# Patient Record
Sex: Female | Born: 1970
Health system: Southern US, Community
[De-identification: ages and names within clinical notes are randomized; demographics above are authoritative.]

## PROBLEM LIST (undated history)

## (undated) DIAGNOSIS — K219 Gastro-esophageal reflux disease without esophagitis: Secondary | ICD-10-CM

## (undated) DIAGNOSIS — O021 Missed abortion: Secondary | ICD-10-CM

## (undated) DIAGNOSIS — R002 Palpitations: Secondary | ICD-10-CM

## (undated) DIAGNOSIS — L853 Xerosis cutis: Secondary | ICD-10-CM

## (undated) DIAGNOSIS — J302 Other seasonal allergic rhinitis: Secondary | ICD-10-CM

## (undated) DIAGNOSIS — E282 Polycystic ovarian syndrome: Secondary | ICD-10-CM

## (undated) DIAGNOSIS — N63 Unspecified lump in unspecified breast: Secondary | ICD-10-CM

## (undated) DIAGNOSIS — E739 Lactose intolerance, unspecified: Secondary | ICD-10-CM

## (undated) DIAGNOSIS — R5383 Other fatigue: Secondary | ICD-10-CM

## (undated) DIAGNOSIS — N979 Female infertility, unspecified: Secondary | ICD-10-CM

## (undated) DIAGNOSIS — R21 Rash and other nonspecific skin eruption: Secondary | ICD-10-CM

## (undated) DIAGNOSIS — M791 Myalgia, unspecified site: Secondary | ICD-10-CM

## (undated) DIAGNOSIS — M2550 Pain in unspecified joint: Secondary | ICD-10-CM

## (undated) DIAGNOSIS — T7840XA Allergy, unspecified, initial encounter: Secondary | ICD-10-CM

## (undated) DIAGNOSIS — E785 Hyperlipidemia, unspecified: Secondary | ICD-10-CM

## (undated) DIAGNOSIS — I10 Essential (primary) hypertension: Secondary | ICD-10-CM

## (undated) DIAGNOSIS — E559 Vitamin D deficiency, unspecified: Secondary | ICD-10-CM

## (undated) DIAGNOSIS — F439 Reaction to severe stress, unspecified: Secondary | ICD-10-CM

## (undated) DIAGNOSIS — K5900 Constipation, unspecified: Secondary | ICD-10-CM

## (undated) DIAGNOSIS — E669 Obesity, unspecified: Secondary | ICD-10-CM

## (undated) HISTORY — DX: Constipation, unspecified: K59.00

## (undated) HISTORY — DX: Xerosis cutis: L85.3

## (undated) HISTORY — DX: Unspecified lump in unspecified breast: N63.0

## (undated) HISTORY — DX: Other seasonal allergic rhinitis: J30.2

## (undated) HISTORY — DX: Reaction to severe stress, unspecified: F43.9

## (undated) HISTORY — DX: Rash and other nonspecific skin eruption: R21

## (undated) HISTORY — DX: Vitamin D deficiency, unspecified: E55.9

## (undated) HISTORY — DX: Female infertility, unspecified: N97.9

## (undated) HISTORY — PX: OTHER SURGICAL HISTORY: SHX169

## (undated) HISTORY — PX: LASIK: SHX215

## (undated) HISTORY — DX: Essential (primary) hypertension: I10

## (undated) HISTORY — DX: Hyperlipidemia, unspecified: E78.5

## (undated) HISTORY — DX: Obesity, unspecified: E66.9

## (undated) HISTORY — PX: EYE SURGERY: SHX253

## (undated) HISTORY — PX: BREAST BIOPSY: SHX20

## (undated) HISTORY — DX: Palpitations: R00.2

## (undated) HISTORY — DX: Other fatigue: R53.83

## (undated) HISTORY — DX: Pain in unspecified joint: M25.50

## (undated) HISTORY — DX: Lactose intolerance, unspecified: E73.9

## (undated) HISTORY — DX: Myalgia, unspecified site: M79.10

## (undated) HISTORY — DX: Allergy, unspecified, initial encounter: T78.40XA

---

## 1992-09-17 HISTORY — PX: WISDOM TOOTH EXTRACTION: SHX21

## 1999-03-02 ENCOUNTER — Other Ambulatory Visit: Admission: RE | Admit: 1999-03-02 | Discharge: 1999-03-02 | Payer: Self-pay | Admitting: Obstetrics and Gynecology

## 2000-03-13 ENCOUNTER — Other Ambulatory Visit: Admission: RE | Admit: 2000-03-13 | Discharge: 2000-03-13 | Payer: Self-pay | Admitting: Obstetrics and Gynecology

## 2001-03-07 ENCOUNTER — Other Ambulatory Visit: Admission: RE | Admit: 2001-03-07 | Discharge: 2001-03-07 | Payer: Self-pay | Admitting: Obstetrics and Gynecology

## 2002-03-06 ENCOUNTER — Other Ambulatory Visit: Admission: RE | Admit: 2002-03-06 | Discharge: 2002-03-06 | Payer: Self-pay | Admitting: Obstetrics and Gynecology

## 2002-07-06 ENCOUNTER — Encounter: Payer: Self-pay | Admitting: Obstetrics

## 2002-07-06 ENCOUNTER — Ambulatory Visit (HOSPITAL_COMMUNITY): Admission: RE | Admit: 2002-07-06 | Discharge: 2002-07-06 | Payer: Self-pay | Admitting: Obstetrics

## 2003-03-12 ENCOUNTER — Other Ambulatory Visit: Admission: RE | Admit: 2003-03-12 | Discharge: 2003-03-12 | Payer: Self-pay | Admitting: Obstetrics and Gynecology

## 2003-06-25 ENCOUNTER — Encounter: Admission: RE | Admit: 2003-06-25 | Discharge: 2003-06-25 | Payer: Self-pay | Admitting: Occupational Medicine

## 2003-06-25 ENCOUNTER — Encounter: Payer: Self-pay | Admitting: Occupational Medicine

## 2004-03-10 ENCOUNTER — Other Ambulatory Visit: Admission: RE | Admit: 2004-03-10 | Discharge: 2004-03-10 | Payer: Self-pay | Admitting: Obstetrics and Gynecology

## 2005-03-05 ENCOUNTER — Ambulatory Visit (HOSPITAL_COMMUNITY): Admission: RE | Admit: 2005-03-05 | Discharge: 2005-03-05 | Payer: Self-pay | Admitting: Allergy

## 2005-04-17 ENCOUNTER — Ambulatory Visit (HOSPITAL_BASED_OUTPATIENT_CLINIC_OR_DEPARTMENT_OTHER): Admission: RE | Admit: 2005-04-17 | Discharge: 2005-04-17 | Payer: Self-pay | Admitting: Obstetrics and Gynecology

## 2005-04-22 ENCOUNTER — Ambulatory Visit: Payer: Self-pay | Admitting: Internal Medicine

## 2006-10-11 ENCOUNTER — Inpatient Hospital Stay (HOSPITAL_COMMUNITY): Admission: AD | Admit: 2006-10-11 | Discharge: 2006-10-11 | Payer: Self-pay | Admitting: *Deleted

## 2007-02-17 ENCOUNTER — Inpatient Hospital Stay (HOSPITAL_COMMUNITY): Admission: AD | Admit: 2007-02-17 | Discharge: 2007-02-21 | Payer: Self-pay | Admitting: Obstetrics and Gynecology

## 2007-02-18 ENCOUNTER — Encounter (INDEPENDENT_AMBULATORY_CARE_PROVIDER_SITE_OTHER): Payer: Self-pay | Admitting: Obstetrics and Gynecology

## 2007-02-24 ENCOUNTER — Encounter: Admission: RE | Admit: 2007-02-24 | Discharge: 2007-03-25 | Payer: Self-pay | Admitting: Obstetrics and Gynecology

## 2007-03-26 ENCOUNTER — Encounter: Admission: RE | Admit: 2007-03-26 | Discharge: 2007-04-25 | Payer: Self-pay | Admitting: Obstetrics and Gynecology

## 2007-04-26 ENCOUNTER — Encounter: Admission: RE | Admit: 2007-04-26 | Discharge: 2007-05-26 | Payer: Self-pay | Admitting: Obstetrics and Gynecology

## 2007-05-27 ENCOUNTER — Encounter: Admission: RE | Admit: 2007-05-27 | Discharge: 2007-06-25 | Payer: Self-pay | Admitting: Obstetrics and Gynecology

## 2007-06-26 ENCOUNTER — Encounter: Admission: RE | Admit: 2007-06-26 | Discharge: 2007-07-26 | Payer: Self-pay | Admitting: Obstetrics and Gynecology

## 2007-07-27 ENCOUNTER — Encounter: Admission: RE | Admit: 2007-07-27 | Discharge: 2007-08-21 | Payer: Self-pay | Admitting: Obstetrics and Gynecology

## 2008-06-13 IMAGING — US US OB COMP +14 WK
1 series · 13 of 13 positions shown · non-contrast
Comparison: none

OBSTETRICAL ULTRASOUND:

 This ultrasound exam was performed in the [HOSPITAL] Ultrasound Department.  The OB US report was generated in the AS system, and faxed to the ordering physician.  This report is also available in [REDACTED] PACS.

[Series 1: us ob comp +14 wk · 0.30mm/px · 13 of 13 slices shown]
[im 1/13]
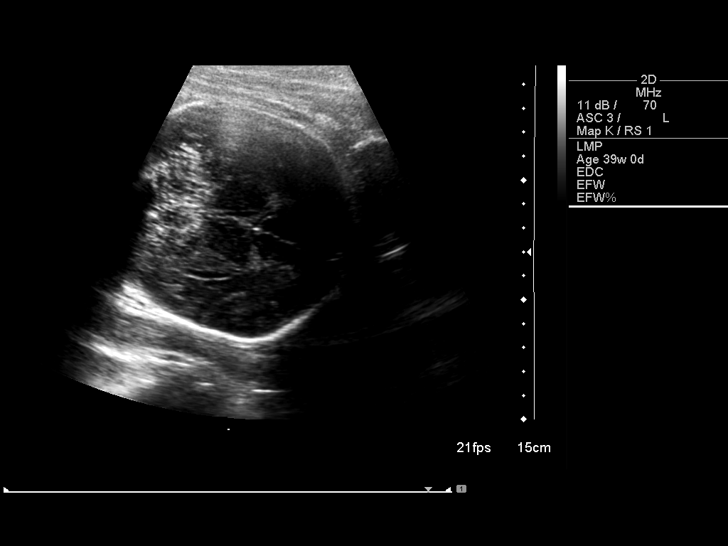
[im 2/13]
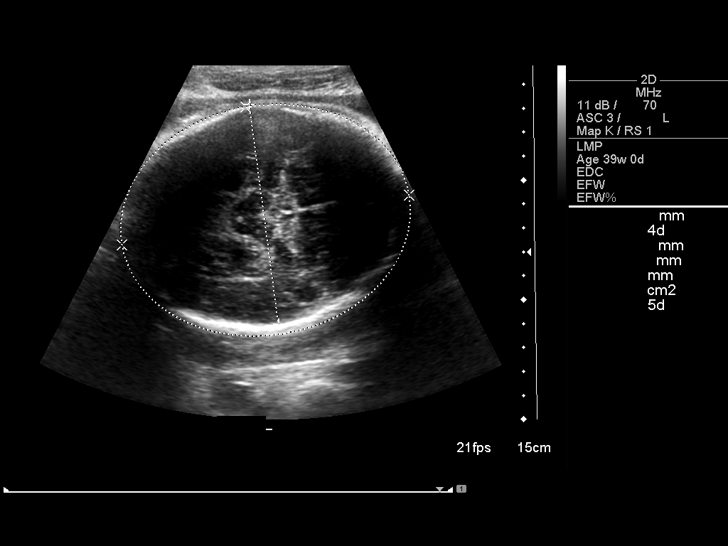
[im 3/13]
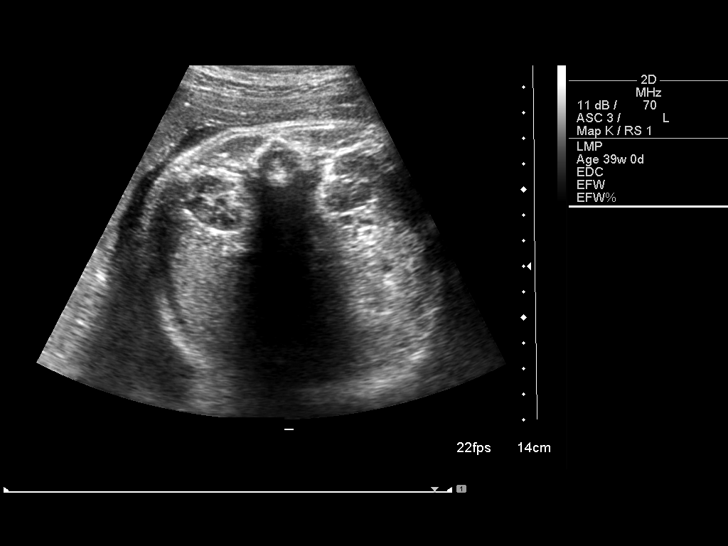
[im 4/13]
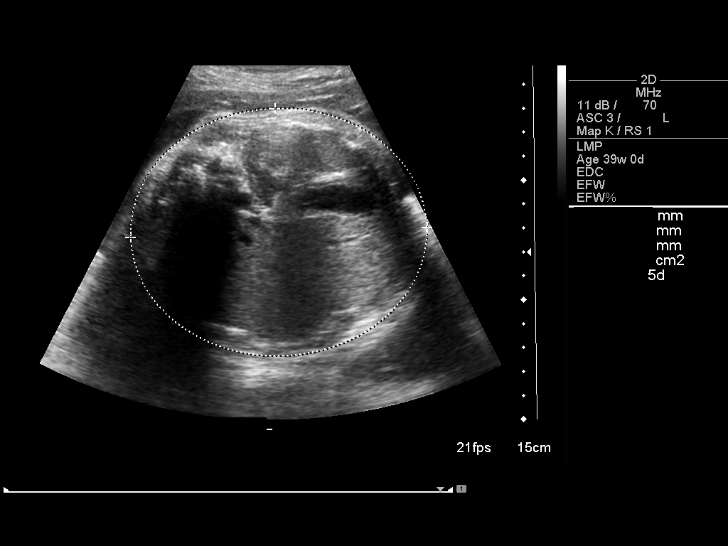
[im 5/13]
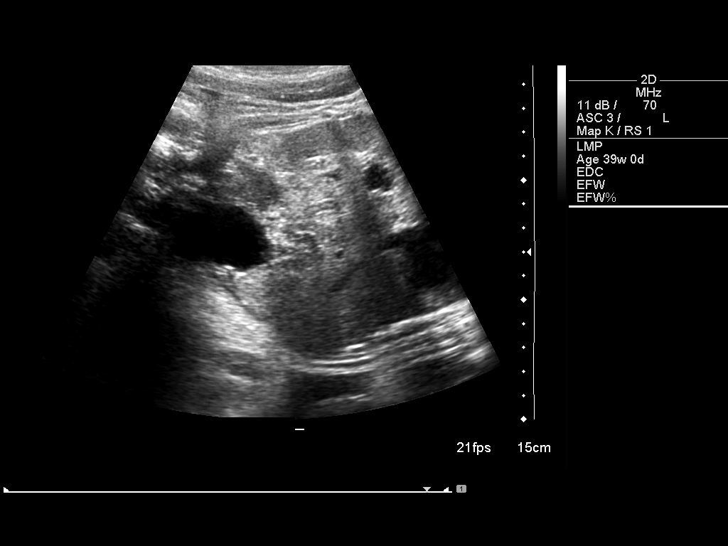
[im 6/13]
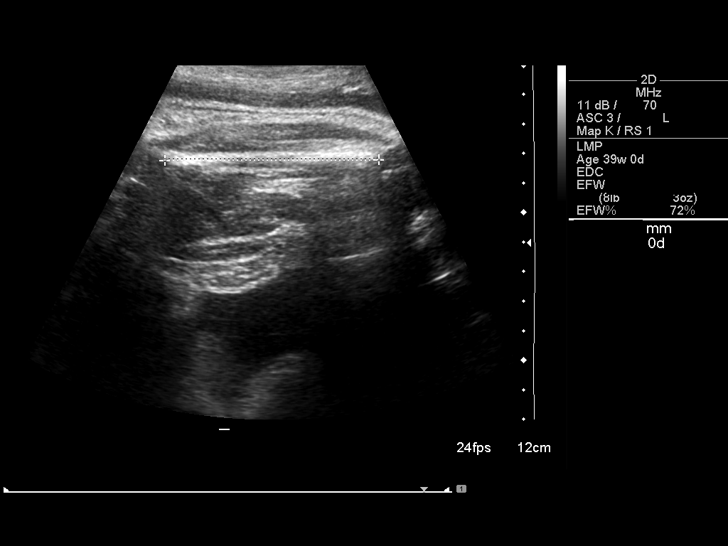
[im 7/13]
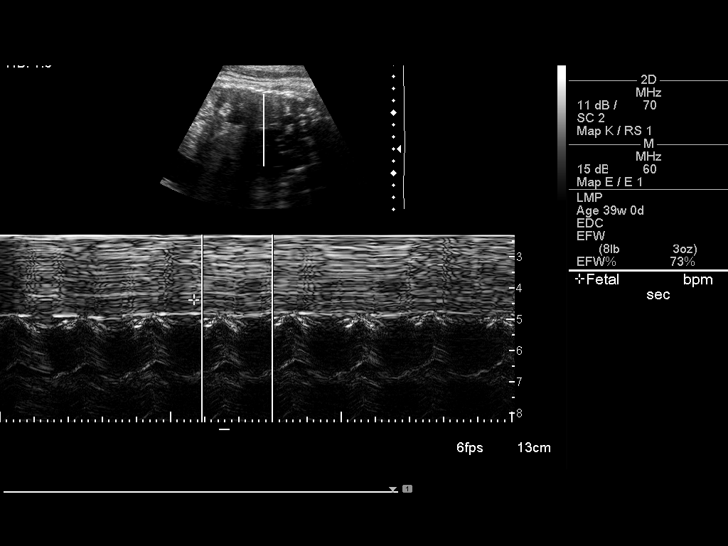
[im 8/13]
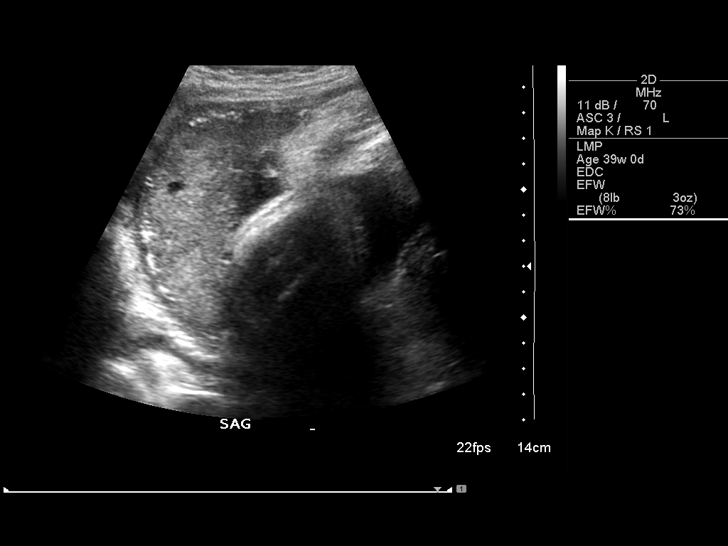
[im 9/13]
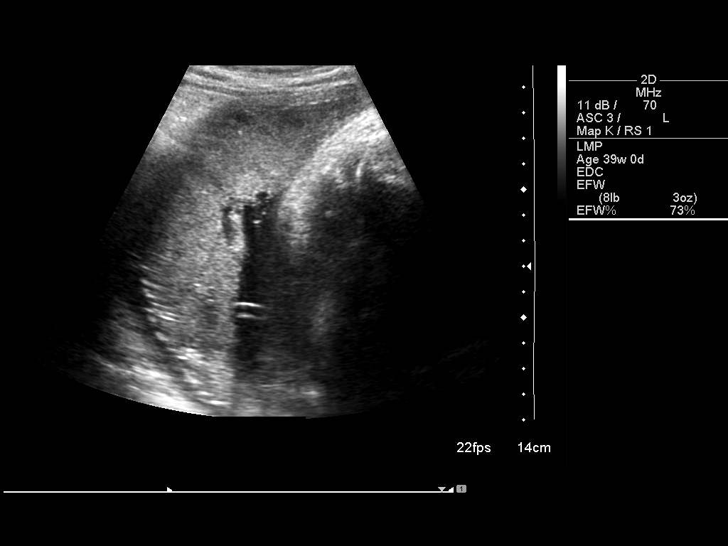
[im 10/13]
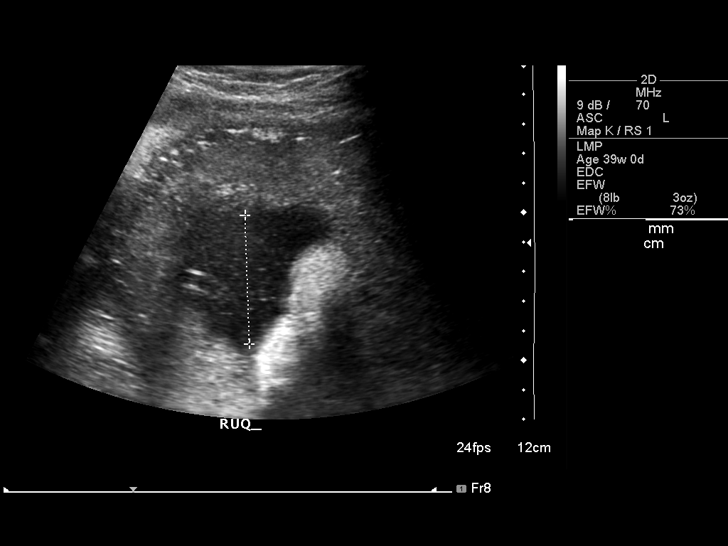
[im 11/13]
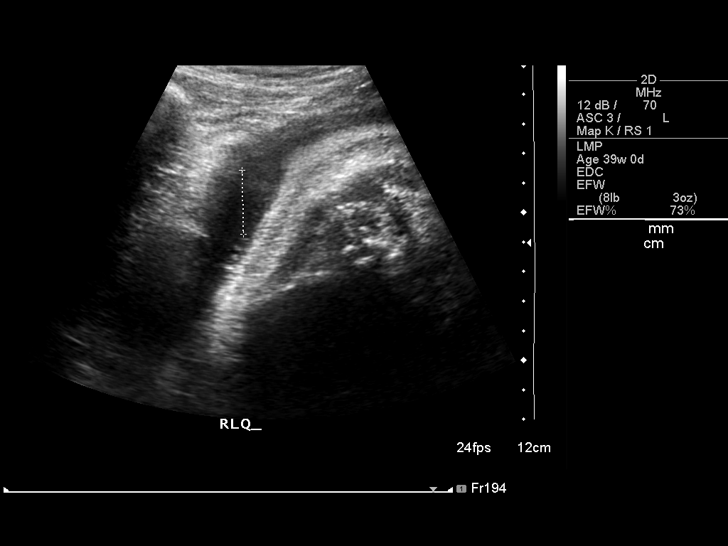
[im 12/13]
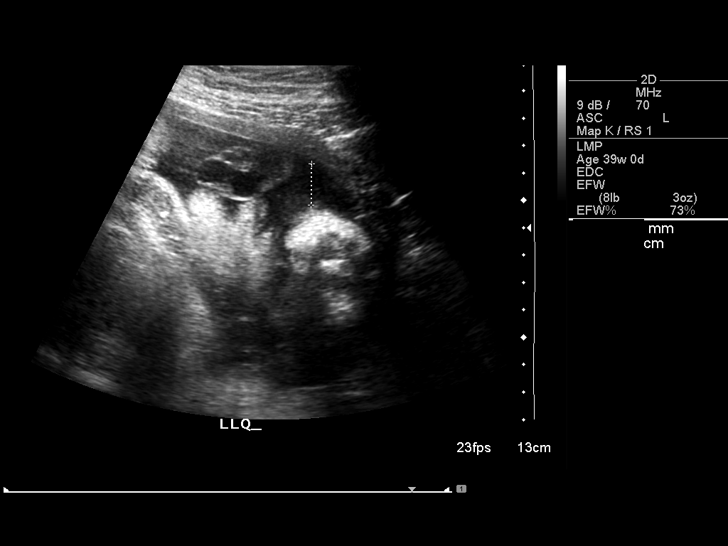
[im 13/13]
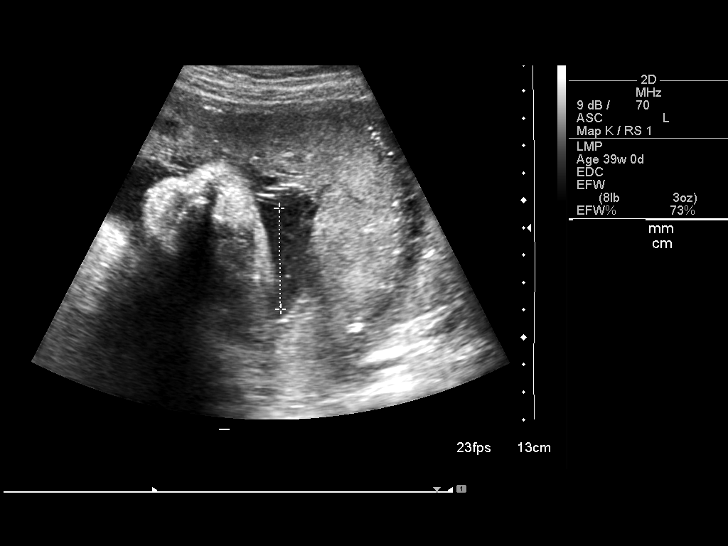

[13 of 13 positions shown; findings below may reference images not displayed]

IMPRESSION: See AS Obstetric US report.

## 2009-05-26 ENCOUNTER — Emergency Department (HOSPITAL_COMMUNITY): Admission: EM | Admit: 2009-05-26 | Discharge: 2009-05-26 | Payer: Self-pay | Admitting: Family Medicine

## 2009-08-29 ENCOUNTER — Emergency Department (HOSPITAL_COMMUNITY): Admission: EM | Admit: 2009-08-29 | Discharge: 2009-08-29 | Payer: Self-pay | Admitting: Family Medicine

## 2010-09-29 ENCOUNTER — Emergency Department (HOSPITAL_COMMUNITY)
Admission: EM | Admit: 2010-09-29 | Discharge: 2010-09-29 | Payer: Self-pay | Source: Home / Self Care | Admitting: Family Medicine

## 2010-12-22 LAB — BASIC METABOLIC PANEL
BUN: 16 mg/dL (ref 6–23)
CO2: 29 mEq/L (ref 19–32)
Calcium: 9.4 mg/dL (ref 8.4–10.5)
Chloride: 101 mEq/L (ref 96–112)
Creatinine, Ser: 0.93 mg/dL (ref 0.4–1.2)
GFR calc Af Amer: >60 mL/min (ref >60)
GFR calc non Af Amer: >60 mL/min (ref >60)
Glucose, Bld: 83 mg/dL (ref 70–99)
Potassium: 4 mEq/L (ref 3.5–5.1)
Sodium: 139 mEq/L (ref 135–145)

## 2010-12-22 LAB — TSH: TSH: 0.406 u[IU]/mL (ref 0.350–4.500)

## 2011-01-30 NOTE — Discharge Summary (Signed)
NAMENETASHA, Robin Knapp              ACCOUNT NO.:  [id number]   MEDICAL RECORD NO.:  [id number]          PATIENT TYPE:  INP   LOCATION:  9117                          FACILITY:  [facility]   PHYSICIAN:  Maxie Better, M.D.DATE OF BIRTH:  07-Mar-1971   DATE OF ADMISSION:  02/17/2007  DATE OF DISCHARGE:  02/21/2007                               DISCHARGE SUMMARY   ADMISSION DIAGNOSES:  1. Spontaneous rupture of membranes.  2. Intrauterine gestation at 39 weeks.   DISCHARGE DIAGNOSES:  1. Term gestation, delivered.  2. Arrest of dilatation.  3. Status post a primary cesarean section.  4. Postoperative anemia.   PROCEDURES:  Primary cesarean section, Kerr hysterotomy.   HISTORY OF PRESENT ILLNESS:  This is a 40 year old gravida 1, para 0  female at 39 years' gestation who presented with spontaneous rupture of  membranes with light meconium on February 17, 2007.  Prenatal course had been  unremarkable.   HOSPITAL COURSE:  The patient was admitted to [facility].  She was  group B strep negative.  Pitocin augmentation was started, and infusion  was also given due to the light meconium.  The patient had an epidural  for pain management.  She progressed to anterior lip and +1 to +2  station with a persistent posterior presentation.  She arrested at that  dilatation for 4 hours, and decision was therefore made to perform a  primary cesarean section.  The patient underwent a primary C-section  with resultant delivery of an 8-pound 15-ounce live female from the  occiput posterior presentation, Apgars of 9 and 9.  The patient had  polycystic ovaries noted at the time of her C-section, normal tubes  noted.  Her postoperative course was unremarkable.  She was tolerating a  regular diet.  Her CBC on postoperative day #1 showed a hemoglobin 10.1,  hematocrit of 29.6, platelet count of 247,000, white count of 17.0.  By  postoperative day #3, the patient remained well.  She was afebrile.  Her  incision was without erythema, induration or exudate.  The staples were  removed.  Steri-Strips were placed.  She had 2+ edema.  She was deemed  well for discharge home.   DISPOSITION:  Home.   CONDITION:  Stable.   DISCHARGE MEDICATIONS:  1. Tylox #30 one to two tablets every 3 to 4 p.r.n. pain.  2. Prenatal vitamins 1 p.o. daily  3. Motrin 800 mg 1 p.o. every 6 to 8 hours p.r.n. pain.   DISCHARGE INSTRUCTIONS:  Per the postpartum booklet given.  Follow-up at  [facility] in 6 weeks.      Maxie Better, M.D.  Electronically Signed     [place]/MEDQ  D:  02/21/2007  T:  02/21/2007  Job:  [id number]

## 2011-01-30 NOTE — Op Note (Signed)
Robin Knapp, Robin Knapp              ACCOUNT NO.:  [id number]   MEDICAL RECORD NO.:  [id number]          PATIENT TYPE:  INP   LOCATION:  9117                          FACILITY:  [facility]   PHYSICIAN:  Maxie Better, M.D.DATE OF BIRTH:  10-Apr-1971   DATE OF PROCEDURE:  02/18/2007  DATE OF DISCHARGE:                               OPERATIVE REPORT   PREOPERATIVE DIAGNOSIS:  Arrest of dilatation.   PROCEDURE:  Primary cesarean section, Kerr hysterotomy.   POSTOPERATIVE DIAGNOSIS:  Arrest of dilatation, direct occiput  presentation.   ANESTHESIA:  Epidural.   SURGEON:  Maxie Better, M.D.   ASSISTANT:  Marlinda Mike, C.N.M.   INDICATION:  A 40 year old, G1, P0, female at 33 weeks presented with  spontaneous rupture of membranes, light meconium fluid on February 17, 2007.  She had Pitocin augmentation of labor, epidural for pain management.  She progressed to 9.5 cm, +1 station, and arrested dilatation for 4  hours.  The patient had an estimated fetal weight of 8 pounds 3 ounces  on the day of admission.  Risk and benefit of surgical procedure was  explained to the patient.  Consent was signed.  The patient was  transferred to the operating room.   PROCEDURE:  Under adequate epidural anesthesia, the patient was placed  in the supine position with a left lateral tilt.  She was sterilely  prepped and draped in usual fashion.  An indwelling Foley catheter was  already in place.  A Pfannenstiel skin was made, carried down to the  rectus fascia.  Rectus fascia was opened transversely.  The rectus  fascia was sharply dissected off the rectus muscle in a superior and  inferior fashion.  The rectus muscle was split in midline.  The parietal  peritoneum was entered bluntly and extended.  The vesicouterine  peritoneum was opened transversely.  The bladder displaced inferiorly  after blunt dissection.  Curvilinear low transverse uterine incision was  then made, extended with bandage scissors.   Subsequent delivery of a  live female from the direct occiput posterior presentation was  accomplished.  Baby was bulb suctioned on the abdomen.  The cord was  clamped and cut.  The baby was transferred to the awaiting pediatrician  who ultimately assigned Apgars of 9 and 9 at one and five minutes.  The  placenta which was posterior was ultimately manually removed.  Uterine  cavity was cleaned of debris.  Uterine incision had no extension.  Uterine incision was closed in 2 layers, the first layer of 0 Monocryl  running locked stitch; second layer was imbricating using 0 Monocryl  suture.  Good hemostasis was noted.  Normal tubes were noted  bilaterally.  Bilateral polycystic ovaries were noted.  A 2.5-cm  subserosal anterior fibroid was noted.  No fibroids were seen.  The  abdomen was copiously irrigated, suctioned of debris.  The parietal  peritoneum was closed with 2-0 Vicryl.  The rectus fascia was closed  with 0 Vicryl x2.  The subcutaneous area was irrigated, small bleeders  were cauterized and interrupted 2-0 plain sutures were placed.  Skin was  approximated using  Ethicon staples.   Specimens:  Placenta sent to pathology.  Estimated blood loss was 700  mL.  Intraoperative fluid was 1000 mL.  Urine output was 175 mL clear  yellow urine.  Sponge and instrument counts x2 were correct.  Complication was none.  Weight of the baby was 8 pounds 15 ounces.  The  patient tolerated the procedure well and was transferred to recovery in  stable condition.      Maxie Better, M.D.  Electronically Signed     [place]/MEDQ  D:  02/18/2007  T:  02/18/2007  Job:  [id number]

## 2011-02-02 NOTE — Consult Note (Signed)
Robin Knapp, Robin Knapp              ACCOUNT NO.:  [id number]   MEDICAL RECORD NO.:  [id number]          PATIENT TYPE:  MAT   LOCATION:  MATC                          FACILITY:  [facility]   PHYSICIAN:  Gerri Spore B. Earlene Plater, M.D.  DATE OF BIRTH:  [birth year]   DATE OF CONSULTATION:  DATE OF DISCHARGE:                                 CONSULTATION   CHIEF COMPLAINT:  Fever.   HISTORY OF PRESENT ILLNESS:  40 year old African American female gravida  1, para 0, at 20 plus weeks, presents with one day of fever and  associated chills and myalgias with 2-3 days preceding of generalized  nasal congestion, cough, and sore throat.  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  No known sick contacts.  Otherwise, no other  complaints.  Specifically, no ear pain and her cough has been non-  productive.   PAST MEDICAL HISTORY:  Otherwise, negative.   PAST OBSTETRICAL HISTORY:  Gravida 1.   MEDICATIONS:  Zyrtec.   ALLERGIES:  None.   PHYSICAL EXAMINATION:  VITAL SIGNS:  Temperature 98.8, pulse 113,  respirations 24, blood pressure 119/77, fetal heart rate initially 175  and rechecked at 162, O2 sat is 99% on room air.  GENERAL:  The patient is alert and oriented in no acute distress, is  mildly ill appearing with nasal congestion.  LUNGS:  Clear.  HEART:  Regular rate and rhythm.  ABDOMEN:  Gravid consistent with dates and nontender.   LABORATORY ASSESSMENT:  Urinalysis is normal.  Complete blood count and  rapid flu is ordered and pending at this time.   ASSESSMENT:  Probable viral illness, most likely influenza. The patient  is non-toxic showing no signs of pulmonary compromise.  Her fever has  come down after she gave herself one dose of Tylenol.  At this point, I  recommend rest, hydrate, and continue Tylenol around the clock.  Notify  us if symptoms worsen.  Follow up [facility] in 1-2 weeks.      Gerri Spore B. Earlene Plater, M.D.  Electronically Signed     [place]/MEDQ  D:  10/11/2006  T:   10/11/2006  Job:  [id number]

## 2011-02-02 NOTE — Procedures (Signed)
NAMELEONDA, Robin Knapp            ACCOUNT NO.:  [id number]   MEDICAL RECORD NO.:  [id number]          PATIENT TYPE:  OUT   LOCATION:  SLEEP CENTER                 FACILITY:  [facility]   PHYSICIAN:  Clinton D. Maple Hudson, M.D. DATE OF BIRTH:  [birth year]   DATE OF STUDY:  04/17/2005                              NOCTURNAL POLYSOMNOGRAM   REFERRING PHYSICIAN:  Dr. Maxie Better   DATE OF STUDY:  April 17, 2005   INDICATION FOR STUDY:  Hypersomnia with sleep apnea.   EPWORTH SLEEPINESS SCORE:  17/24   BMI:  26   WEIGHT:  155 pounds   SLEEP ARCHITECTURE:  Total sleep time 413 minutes with sleep efficiency 84%.  Stage I was 4%, stage II 54%, states III and IV 17%.  REM 24% of total sleep  time.  Sleep latency 13 minutes.  REM latency 227 minutes.  Awake after  sleep onset 64 minutes.  Arousal index 6.  There were frequent side-to-side  position changes.  No bedtime medication reported.  She does use a long  acting bronchodilator, which may have some impact on sleep quality, as well  as a mildly sedating antihistamine.   RESPIRATORY DATA:  Respiratory disturbance index (RDI, AHI) 0.6 obstructive  events per hour, which is within normal limits.  This reflected three  obstructive apneas and one central apnea.  The events were recorded and  while supine and on left side.  She did not meet criteria for CPAP titration  while on the study.   OXYGEN DATA:  Mild occasional snoring with oxygen desaturations to a nadir  of 96%.  Mean oxygen saturation through the study was 98% on room air.   CARDIAC DATA:  Normal sinus rhythm.   MOVEMENT/PARASOMNIA:  A total of 33 limb jerks were recorded, of which 8  were associated with arousal or awakening for a periodic limb movement with  arousal index of 1.3 per hour, which is probably insignificant.   IMPRESSION/RECOMMENDATION:  1.  Occasional sleep disorder breathing events, well within normal adult      limits, with an RDI of 0.6 per hour, mild  snoring and normal      oxygenation.  2.  Unremarkable sleep architecture for Sleep Center environment with some      awakening after sleep onset.  3.  The patient indicated that she did not cough as much on the study night      as she sometimes does at home.  Consider how this may refect on      management of asthma or possibly reflux.  4.  Technician commented that the patient's sleep may be disturbed by stress      and excitement related to upcoming wedding.      Clinton D. Maple Hudson, M.D.  Diplomat    CDY/MEDQ  D:  04/22/2005 10:05:30  T:  04/23/2005 09:32:43  Job:  [id number]

## 2011-07-05 LAB — CBC
HCT: 29.6 — ABNORMAL LOW
HCT: 36.8
Hemoglobin: 10.1 — ABNORMAL LOW
Hemoglobin: 12.6
MCHC: 34
MCV: 87.5
RBC: 3.39 — ABNORMAL LOW
RBC: 4.27
WBC: 11.1 — ABNORMAL HIGH

## 2012-01-04 ENCOUNTER — Other Ambulatory Visit: Payer: Self-pay | Admitting: Advanced Practice Midwife

## 2012-01-04 MED ORDER — AZITHROMYCIN 250 MG PO TABS: ORAL_TABLET | ORAL | Status: AC

## 2012-01-06 ENCOUNTER — Encounter (HOSPITAL_COMMUNITY): Payer: Self-pay | Admitting: *Deleted

## 2012-01-06 ENCOUNTER — Emergency Department (INDEPENDENT_AMBULATORY_CARE_PROVIDER_SITE_OTHER): Payer: 59

## 2012-01-06 ENCOUNTER — Emergency Department (HOSPITAL_COMMUNITY)
Admission: EM | Admit: 2012-01-06 | Discharge: 2012-01-06 | Disposition: A | Discharge status: Home / Self Care | Payer: 59 | Source: Home / Self Care | Attending: Family Medicine | Admitting: Family Medicine

## 2012-01-06 DIAGNOSIS — J45901 Unspecified asthma with (acute) exacerbation: Secondary | ICD-10-CM

## 2012-01-06 DIAGNOSIS — J069 Acute upper respiratory infection, unspecified: Secondary | ICD-10-CM

## 2012-01-06 MED ORDER — LEVALBUTEROL TARTRATE 45 MCG/ACT IN AERO: 1.0000 | INHALATION_SPRAY | Freq: Four times a day (QID) | RESPIRATORY_TRACT | Status: DC | PRN

## 2012-01-06 MED ORDER — HYDROCODONE-ACETAMINOPHEN 7.5-500 MG/15ML PO SOLN: 15.0000 mL | Freq: Three times a day (TID) | ORAL | Status: AC | PRN

## 2012-01-06 MED ORDER — PREDNISONE 20 MG PO TABS: ORAL_TABLET | ORAL | Status: AC

## 2012-01-06 NOTE — Discharge Instructions (Signed)
Your lungs are clear on chest x-ray. Your strep test is negative. My impression is that your having asthma exacerbation related to upper respiratory infection or seasonal allergies. Finish current antibiotic as previously prescribed. Is very important top keep well hydrated. Take the prescribed medications as instructed. Take ibuprofen scheduled every 8 hours for the next 24-48 hours take with food and plenty of liquids as it can upset your stomach, can alternate with Tylenol every 6 hours as needed for fever or pain. Use nasal saline spray at least 3 times a day. (simply saline is over the counter) Go to the emergency department if persistent cough, difficulty breathing new onset of fever or worsening symptoms despite following treatment. Dr. Alver Fisher number was provided aspergillin request to establish primary care.

## 2012-01-06 NOTE — ED Notes (Signed)
Pt with c/o of cough/congestion and sob onset 4/14 - pt has been using her inhaler and and daughters nebulizer without relief -

## 2012-01-06 NOTE — ED Provider Notes (Signed)
History     CSN: [id number]  Arrival date & time 01/06/12  1826   First MD Initiated Contact with Patient 01/06/12 1826      Chief Complaint  Patient presents with  . Cough  . Nasal Congestion  . Shortness of Breath    (Consider location/radiation/quality/duration/timing/severity/associated sxs/prior treatment) HPI Comments: 41 y/o non smoker female with h/o asthma here c/o 1 week with cough/congestion, sore throat, general malaise and shortness of breath. Has had yellow sputum and daughter recently diagnosed with pneumonia. Chest pain in anterior chest only with coughing. Currently taking azithromycin day 3/5 prescribed by another provider. Has used bronchodilators but she ran out. Afebrile has taken tylenol, dextromethorphan and pseudoephedrine for her symptoms.    Past Medical History  Diagnosis Date  . Asthma     Past Surgical History  Procedure Date  . Cesarean section   . Lasik   . Prk     History reviewed. No pertinent family history.  History  Substance Use Topics  . Smoking status: Never Smoker   . Smokeless tobacco: Not on file  . Alcohol Use: No    OB History    Grav Para Term Preterm Abortions TAB SAB Ect Mult Living                  Review of Systems  Constitutional: Positive for chills, appetite change and fatigue.  HENT: Positive for congestion, sore throat and sneezing. Negative for trouble swallowing and neck pain.   Eyes: Positive for redness.  Respiratory: Positive for cough, chest tightness and shortness of breath.   Cardiovascular: Negative for leg swelling.  Gastrointestinal: Negative for nausea, vomiting, abdominal pain and diarrhea.    Allergies  Sulfa antibiotics  Home Medications   Current Outpatient Rx  Name Route Sig Dispense Refill  . ACETAMINOPHEN 325 MG PO TABS Oral Take 1,000 mg by mouth every 6 (six) hours as needed.    . AZITHROMYCIN 250 MG PO TABS  2 tabs po x 1, then 1 tab po x 4 days 6 each 0  . DEXTROMETHORPHAN  POLISTIREX ER 30 MG/5ML PO LQCR Oral Take 60 mg by mouth as needed.    [name] DM-GUAIFENESIN ER 30-600 MG PO TB12 Oral Take 1 tablet by mouth every 12 (twelve) hours.    . IBUPROFEN 800 MG PO TABS Oral Take 800 mg by mouth every 8 (eight) hours as needed.    [name] PSEUDOEPHEDRINE HCL 30 MG PO TABS Oral Take 30 mg by mouth every 4 (four) hours as needed.    [name] HYDROCODONE-ACETAMINOPHEN 7.5-500 MG/15ML PO SOLN Oral Take 15 mLs by mouth every 8 (eight) hours as needed for pain or cough. 120 mL 0  . LEVALBUTEROL TARTRATE 45 MCG/ACT IN AERO Inhalation Inhale 1-2 puffs into the lungs every 6 (six) hours as needed for wheezing. 1 Inhaler 0  . PREDNISONE 20 MG PO TABS  2 tabs po daily for 5 days 10 tablet 0    BP 122/80  Pulse 103  Temp(Src) 98.4 F (36.9 C) (Oral)  Resp 20  SpO2 100%  LMP 12/15/2011  Physical Exam  Nursing note and vitals reviewed. Constitutional: She is oriented to person, place, and time. She appears well-developed and well-nourished. No distress.  HENT:  Head: Normocephalic and atraumatic.  Right Ear: External ear normal.  Left Ear: External ear normal.  Mouth/Throat: Oropharynx is clear and moist. No oropharyngeal exudate.       Nasal Congestion with erythema and swelling of nasal turbinates,  no rhinorrhea. Mild pharyngeal erythema no exudates. No uvula deviation. No trismus. TM's normal.  Eyes: Pupils are equal, round, and reactive to light. Right eye exhibits no discharge. Left eye exhibits no discharge.       Conjunctival erythema and eye puffiness bilateral.  Neck: Neck supple. No JVD present. No thyromegaly present.  Cardiovascular: Normal heart sounds.   Pulmonary/Chest: Effort normal.       Mildly decreased breath sounds bilaterally sporadic expiratory rhonchi. No orthopnea or tachypnea no active wheezing. No retractions, no rales.  Lymphadenopathy:    She has no cervical adenopathy.  Neurological: She is alert and oriented to person, place, and time.  Skin: No rash  noted.    ED Course  Procedures (including critical care time)   Labs Reviewed  POCT RAPID STREP A ([facility]RG CARE ONLY)  LAB REPORT - SCANNED   Dg Chest 2 View  01/06/2012  *RADIOLOGY REPORT*  Clinical Data: Cough, congestion and shortness of breath.  CHEST - 2 VIEW  Comparison: PA and lateral chest 07/06/2002.  Findings: Lungs are clear.  Heart size is normal.  No pneumothorax or effusion.  IMPRESSION: Negative chest.  Original Report Authenticated By: Bernadene Bell. D'ALESSIO, M.D.     1. Asthma exacerbation   2. URI (upper respiratory infection)       MDM  Normal chest Xray. Impress URI likely viral vs seasonal allergies causing asthma exacerbation. Asked to complete abxs as previously prescribed. Prescribed prednisone, xopenex refill, and hydrocodone. Reccommended supportive measures. Return if worsening symptoms despite following treatment.        Sharin Grave, MD 01/07/12 1427

## 2012-05-02 ENCOUNTER — Encounter (HOSPITAL_COMMUNITY): Payer: Self-pay | Admitting: *Deleted

## 2012-05-03 ENCOUNTER — Encounter (HOSPITAL_COMMUNITY): Payer: Self-pay | Admitting: Pharmacist

## 2012-05-05 ENCOUNTER — Other Ambulatory Visit: Payer: Self-pay | Admitting: Obstetrics and Gynecology

## 2012-05-09 ENCOUNTER — Encounter (HOSPITAL_COMMUNITY): Payer: Self-pay | Admitting: Anesthesiology

## 2012-05-09 ENCOUNTER — Ambulatory Visit (HOSPITAL_COMMUNITY): Payer: 59 | Admitting: Anesthesiology

## 2012-05-09 ENCOUNTER — Encounter (HOSPITAL_COMMUNITY)
Admission: RE | Disposition: A | Discharge status: Home / Self Care | Payer: Self-pay | Source: Ambulatory Visit | Attending: Obstetrics and Gynecology

## 2012-05-09 ENCOUNTER — Ambulatory Visit (HOSPITAL_COMMUNITY)
Admission: RE | Admit: 2012-05-09 | Discharge: 2012-05-09 | Disposition: A | Discharge status: Home / Self Care | Payer: 59 | Source: Ambulatory Visit | Attending: Obstetrics and Gynecology | Admitting: Obstetrics and Gynecology

## 2012-05-09 DIAGNOSIS — O034 Incomplete spontaneous abortion without complication: Secondary | ICD-10-CM

## 2012-05-09 DIAGNOSIS — O021 Missed abortion: Secondary | ICD-10-CM | POA: Insufficient documentation

## 2012-05-09 HISTORY — DX: Missed abortion: O02.1

## 2012-05-09 HISTORY — DX: Polycystic ovarian syndrome: E28.2

## 2012-05-09 HISTORY — PX: DILATION AND EVACUATION: SHX1459

## 2012-05-09 HISTORY — DX: Gastro-esophageal reflux disease without esophagitis: K21.9

## 2012-05-09 LAB — CBC
Platelets: 252 10*3/uL (ref 150–400)
RDW: 13.4 % (ref 11.5–15.5)
WBC: 9 10*3/uL (ref 4.0–10.5)

## 2012-05-09 SURGERY — DILATION AND EVACUATION, UTERUS
Anesthesia: Monitor Anesthesia Care | Wound class: Clean Contaminated

## 2012-05-09 MED ORDER — FENTANYL CITRATE 0.05 MG/ML IJ SOLN
INTRAMUSCULAR | Status: AC
  Filled 2012-05-09: qty 2

## 2012-05-09 MED ORDER — 0.9 % SODIUM CHLORIDE (POUR BTL) OPTIME
TOPICAL | Status: DC | PRN
  Administered 2012-05-09: 50 mL

## 2012-05-09 MED ORDER — CHLOROPROCAINE HCL 1 % IJ SOLN
INTRAMUSCULAR | Status: DC | PRN
  Administered 2012-05-09: 20 mL

## 2012-05-09 MED ORDER — CEFAZOLIN SODIUM-DEXTROSE 2-3 GM-% IV SOLR: 2.0000 g | INTRAVENOUS | Status: DC

## 2012-05-09 MED ORDER — PROPOFOL 10 MG/ML IV EMUL
INTRAVENOUS | Status: AC
  Filled 2012-05-09: qty 20

## 2012-05-09 MED ORDER — FENTANYL CITRATE 0.05 MG/ML IJ SOLN
25.0000 ug | INTRAMUSCULAR | Status: DC | PRN
  Administered 2012-05-09: 50 ug via INTRAVENOUS

## 2012-05-09 MED ORDER — LIDOCAINE HCL (CARDIAC) 20 MG/ML IV SOLN
INTRAVENOUS | Status: AC
  Filled 2012-05-09: qty 5

## 2012-05-09 MED ORDER — CHLOROPROCAINE HCL 1 % IJ SOLN
INTRAMUSCULAR | Status: AC
  Filled 2012-05-09: qty 30

## 2012-05-09 MED ORDER — FENTANYL CITRATE 0.05 MG/ML IJ SOLN
INTRAMUSCULAR | Status: DC | PRN
  Administered 2012-05-09: 50 ug via INTRAVENOUS
  Administered 2012-05-09: 25 ug via INTRAVENOUS

## 2012-05-09 MED ORDER — LACTATED RINGERS IV SOLN
INTRAVENOUS | Status: DC
  Administered 2012-05-09: 11:00:00 via INTRAVENOUS

## 2012-05-09 MED ORDER — MIDAZOLAM HCL 2 MG/2ML IJ SOLN
INTRAMUSCULAR | Status: AC
  Filled 2012-05-09: qty 2

## 2012-05-09 MED ORDER — HYDROCODONE-ACETAMINOPHEN 5-500 MG PO TABS: 1.0000 | ORAL_TABLET | Freq: Four times a day (QID) | ORAL | Status: AC | PRN

## 2012-05-09 MED ORDER — CEFAZOLIN SODIUM-DEXTROSE 2-3 GM-% IV SOLR
INTRAVENOUS | Status: AC
  Administered 2012-05-09: 2 g via INTRAVENOUS
  Filled 2012-05-09: qty 50

## 2012-05-09 MED ORDER — PROPOFOL 10 MG/ML IV EMUL
INTRAVENOUS | Status: DC | PRN
  Administered 2012-05-09 (×2): 100 mg via INTRAVENOUS
  Administered 2012-05-09: 50 mg via INTRAVENOUS

## 2012-05-09 MED ORDER — ONDANSETRON HCL 4 MG/2ML IJ SOLN
INTRAMUSCULAR | Status: AC
  Filled 2012-05-09: qty 2

## 2012-05-09 MED ORDER — FENTANYL CITRATE 0.05 MG/ML IJ SOLN
INTRAMUSCULAR | Status: AC
  Administered 2012-05-09: 50 ug via INTRAVENOUS
  Filled 2012-05-09: qty 2

## 2012-05-09 MED ORDER — ONDANSETRON HCL 4 MG/2ML IJ SOLN
INTRAMUSCULAR | Status: DC | PRN
  Administered 2012-05-09: 4 mg via INTRAVENOUS

## 2012-05-09 SURGICAL SUPPLY — 19 items
CATH ROBINSON RED A/P 16FR (CATHETERS) ×2 IMPLANT
CLOTH BEACON ORANGE TIMEOUT ST (SAFETY) ×2 IMPLANT
DECANTER SPIKE VIAL GLASS SM (MISCELLANEOUS) ×2 IMPLANT
GLOVE BIO SURGEON STRL SZ 6.5 (GLOVE) ×2 IMPLANT
GLOVE BIOGEL PI IND STRL 7.0 (GLOVE) ×2 IMPLANT
GLOVE BIOGEL PI INDICATOR 7.0 (GLOVE) ×2
GOWN PREVENTION PLUS LG XLONG (DISPOSABLE) ×2 IMPLANT
KIT BERKELEY 1ST TRIMESTER 3/8 (MISCELLANEOUS) ×2 IMPLANT
NEEDLE SPNL 22GX3.5 QUINCKE BK (NEEDLE) ×2 IMPLANT
NS IRRIG 1000ML POUR BTL (IV SOLUTION) ×2 IMPLANT
PACK VAGINAL MINOR WOMEN LF (CUSTOM PROCEDURE TRAY) ×2 IMPLANT
PAD PREP 24X48 CUFFED NSTRL (MISCELLANEOUS) ×2 IMPLANT
SET BERKELEY SUCTION TUBING (SUCTIONS) ×2 IMPLANT
SYR CONTROL 10ML LL (SYRINGE) ×2 IMPLANT
TOWEL OR 17X24 6PK STRL BLUE (TOWEL DISPOSABLE) ×4 IMPLANT
VACURETTE 10 RIGID CVD (CANNULA) IMPLANT
VACURETTE 7MM CVD STRL WRAP (CANNULA) IMPLANT
VACURETTE 8 RIGID CVD (CANNULA) ×2 IMPLANT
VACURETTE 9 RIGID CVD (CANNULA) IMPLANT

## 2012-05-09 NOTE — Anesthesia Postprocedure Evaluation (Signed)
  Anesthesia Post-op Note  Patient: Robin Knapp  Procedure(s) Performed: Procedure(s) (LRB): DILATATION AND EVACUATION (N/A)  Patient is awake and responsive. Pain and nausea are reasonably well controlled. Vital signs are stable and clinically acceptable. Oxygen saturation is clinically acceptable. There are no apparent anesthetic complications at this time. Patient is ready for discharge.

## 2012-05-09 NOTE — Progress Notes (Signed)
I visited with Robin Knapp after her procedure.  Her mother was with her in the recovery room.  She seemed to be coping with it as well as can be expected.  She reported that she had had some time to accept the outcome because she knew from the beginning that something was not right.   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   She is aware of on-going chaplain availability for support.  [employer] Pager, [phone] 12:56 PM   05/09/12 1200  Clinical Encounter Type  Visited With Patient and family together  Visit Type Spiritual support  Spiritual Encounters  Spiritual Needs Emotional

## 2012-05-09 NOTE — OR Nursing (Signed)
1030 History and physical on paper on the chart.

## 2012-05-09 NOTE — Brief Op Note (Signed)
05/09/2012  12:05 PM  PATIENT:  Robin Knapp  41 y.o. female  PRE-OPERATIVE DIAGNOSIS:  Missed Abortion, incomplete abortion  POST-OPERATIVE DIAGNOSIS:   Incomplete abortion, Missed Abortion  PROCEDURE:  Procedure(s) (LRB):SUCTION DILATATION AND EVACUATION (N/A)  SURGEON:  Surgeon(s) and Role:    * Shakaria Raphael Cathie Beams, MD - Primary  PHYSICIAN ASSISTANT:   ASSISTANTS: none   ANESTHESIA:   IV sedation and paracervical block  EBL:  Total I/O In: 900 [I.V.:900] Out: 175 [Urine:150; Blood:25]  FINDINGS:  CERVIX DILATED, MOD POC, , AV UTERUS  BLOOD ADMINISTERED:none  DRAINS: none   LOCAL MEDICATIONS USED:  OTHER NESICAINE  SPECIMEN:  Source of Specimen:  POC  DISPOSITION OF SPECIMEN:  PATHOLOGY  COUNTS:  YES  TOURNIQUET:  * No tourniquets in log *  DICTATION: .Other Dictation: Dictation Number   PLAN OF CARE: Discharge to home after PACU  PATIENT DISPOSITION:  PACU - hemodynamically stable.   Delay start of Pharmacological VTE agent (>24hrs) due to surgical blood loss or risk of bleeding: no

## 2012-05-09 NOTE — Anesthesia Preprocedure Evaluation (Signed)
Anesthesia Evaluation  Patient identified by MRN, date of birth, ID band Patient awake    Reviewed: Allergy & Precautions, H&P , NPO status , Patient's Chart, lab work & pertinent test results, reviewed documented beta blocker date and time   History of Anesthesia Complications Negative for: history of anesthetic complications  Airway Mallampati: III TM Distance: >3 FB Neck ROM: full    Dental  (+) Teeth Intact Permanent retainer on lower teeth:   Pulmonary asthma (last used May 2013 - usually only needs when sick) ,  breath sounds clear to auscultation        Cardiovascular Exercise Tolerance: Good + dysrhythmias (h/o stress-induced PVCs) Rhythm:regular Rate:Normal     Neuro/Psych negative neurological ROS  negative psych ROS   GI/Hepatic Neg liver ROS, GERD- (with pregnancy only)  ,  Endo/Other  negative endocrine ROS  Renal/GU negative Renal ROS  negative genitourinary   Musculoskeletal   Abdominal   Peds  Hematology negative hematology ROS (+)   Anesthesia Other Findings   Reproductive/Obstetrics (+) Pregnancy (missed ab 6 weeks)                           Anesthesia Physical Anesthesia Plan  ASA: II  Anesthesia Plan: MAC   Post-op Pain Management:    Induction:   Airway Management Planned:   Additional Equipment:   Intra-op Plan:   Post-operative Plan:   Informed Consent: I have reviewed the patients History and Physical, chart, labs and discussed the procedure including the risks, benefits and alternatives for the proposed anesthesia with the patient or authorized representative who has indicated his/her understanding and acceptance.     Plan Discussed with: Surgeon and CRNA  Anesthesia Plan Comments:         Anesthesia Quick Evaluation

## 2012-05-09 NOTE — Transfer of Care (Signed)
Immediate Anesthesia Transfer of Care Note  Patient: Robin Knapp  Procedure(s) Performed: Procedure(s) (LRB): DILATATION AND EVACUATION (N/A)  Patient Location: Mother/Baby  Anesthesia Type: MAC  Level of Consciousness: awake, alert  and oriented  Airway & Oxygen Therapy: Patient Spontanous Breathing  Post-op Assessment: Report given to PACU RN and Post -op Vital signs reviewed and stable  Post vital signs: Reviewed and stable  Complications: No apparent anesthesia complications

## 2012-05-11 NOTE — Op Note (Signed)
NAME:  Robin Knapp, Robin Knapp              ACCOUNT NO.:  [id number]  MEDICAL RECORD NO.:  [id number]  LOCATION:  [facility]                          FACILITY:  [facility]  PHYSICIAN:  Maxie Better, M.D.DATE OF BIRTH:  1971/03/08  DATE OF PROCEDURE:  05/09/2012 DATE OF DISCHARGE:  05/09/2012                              OPERATIVE REPORT   PREOPERATIVE DIAGNOSIS:  Incomplete abortion, missed abortion.  POSTOPERATIVE DIAGNOSIS: Incomplete abortion, missed abortion  PROCEDURE; SUCTION DILATION AND CURETTAGE  ANESTHESIA:  MAC, paracervical block  SURGEON:  Maxie Better, M.D.  ASSISTANT:  None.  INDICATION:  This is a 41 year old, gravida 2, para 1-0-0-1, married black female, who was diagnosed with a missed AB and was scheduled for a surgical management, but who presented with having passed the sac at home and findings of vascular tissue in the endometrium on office sonogram.  The patient was consented for suction D and C.  Risk of procedure explained to the patient.  Consent was signed.  The patient was transferred to the operating room.  PROCEDURE:  Under adequate monitored anesthesia, the patient was placed in the dorsal lithotomy position.  She was sterilely prepped and draped in usual fashion.  Bladder was catheterized for scant amount of urine. Examination under anesthesia revealed a cervix which is about dilated, slightly enlarged anteverted uterus.  No adnexal masses could be appreciated.  A bivalve speculum was placed in the vagina.  A 1% Nesacaine was injected paracervically at the 3 and 9 o'clock position. The anterior lip of the cervix was grasped with a single-tooth tenaculum.  The cervix was easily accepted a #25 Pratt dilator.  An 8 mm curved suction cannula was introduced into the uterine cavity.  Moderate amount of tissue was obtained.  The cavity was gently curetted and suctioned.  When all tissue was felt to be removed, all instruments then removed from the  vagina.  SPECIMEN:  Labeled products of conception was sent to pathology.  ESTIMATED BLOOD LOSS:  Minimal.  COMPLICATIONS:  None.  The patient tolerated the procedure well, was transferred to recovery in stable condition.    Maxie Better, M.D.    [place]/[facility]  D:  05/10/2012  T:  05/11/2012  Job:  [id number]

## 2012-05-12 ENCOUNTER — Encounter (HOSPITAL_COMMUNITY): Payer: Self-pay | Admitting: Obstetrics and Gynecology

## 2012-09-11 ENCOUNTER — Other Ambulatory Visit: Payer: Self-pay | Admitting: Radiology

## 2013-05-02 IMAGING — CR DG CHEST 2V
2 series · 2 of 2 positions shown · non-contrast
Comparison: PA and lateral chest 07/06/2002.

CLINICAL DATA: Cough, congestion and shortness of breath.

CHEST - 2 VIEW

[view not recorded (1 of 2)]
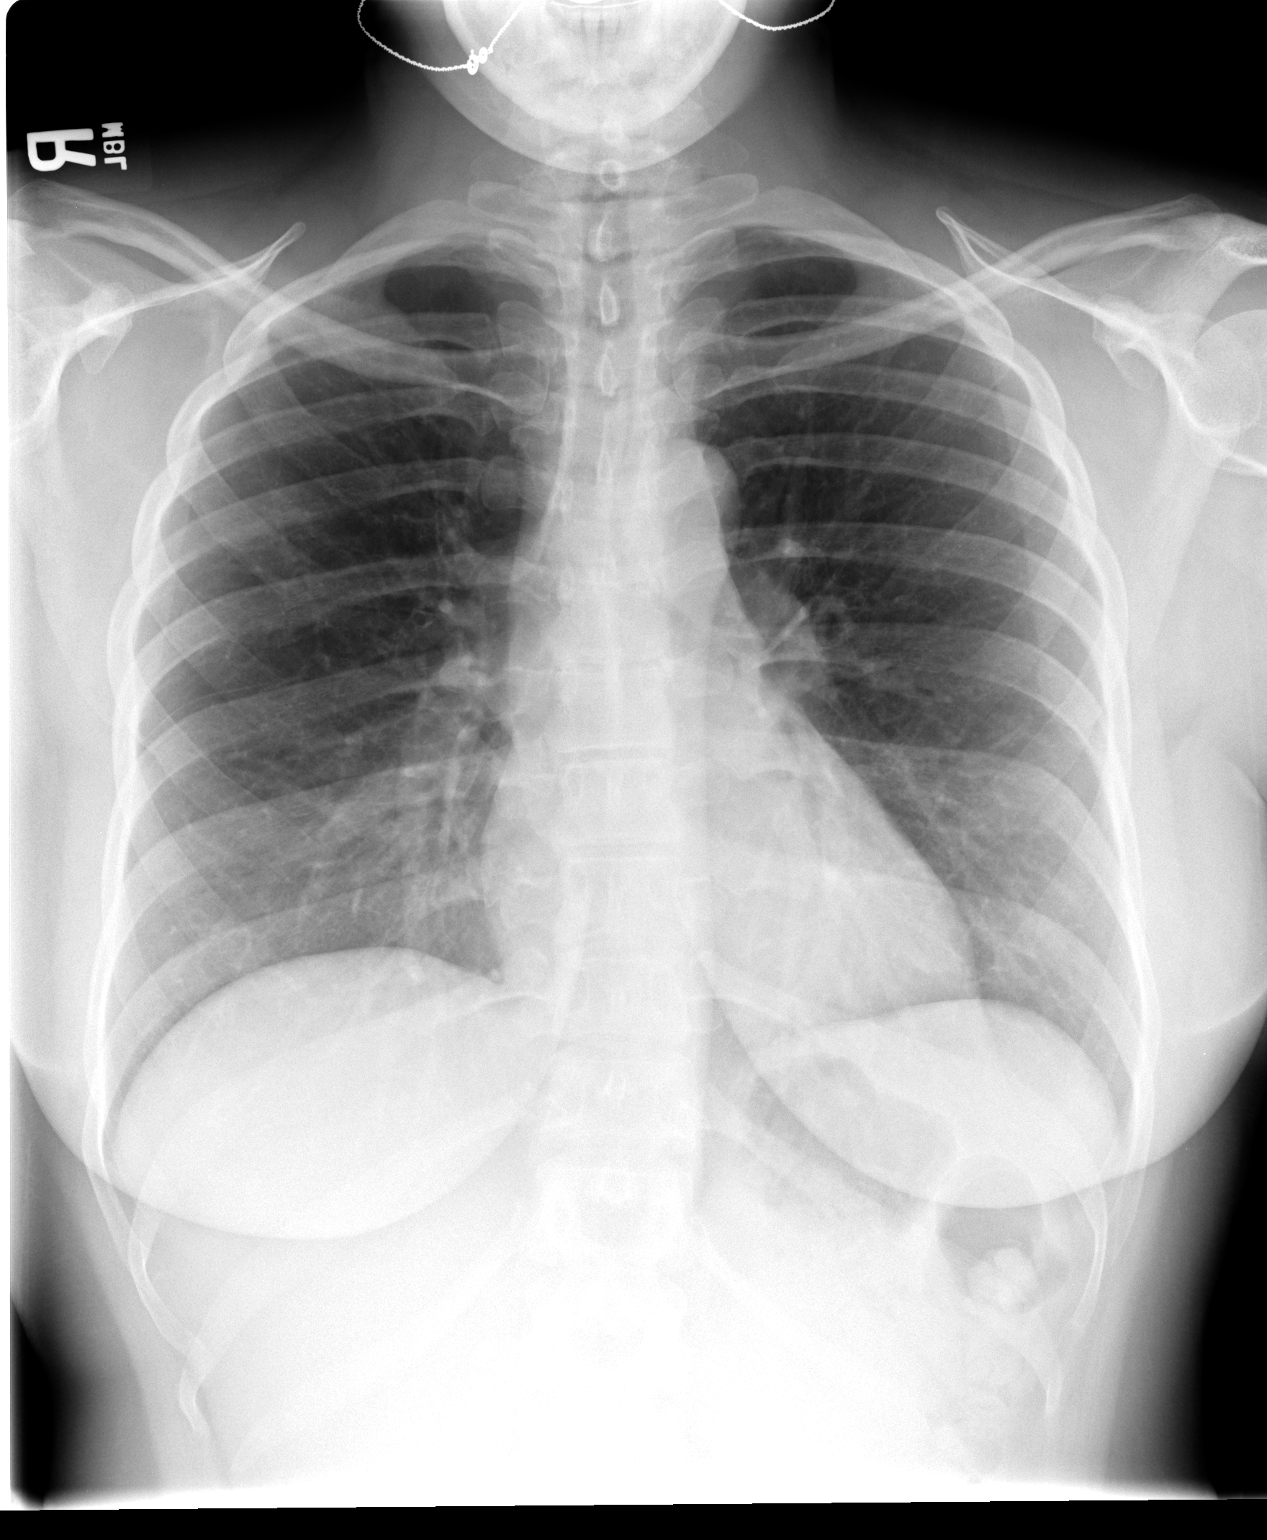

[view not recorded (2 of 2)]
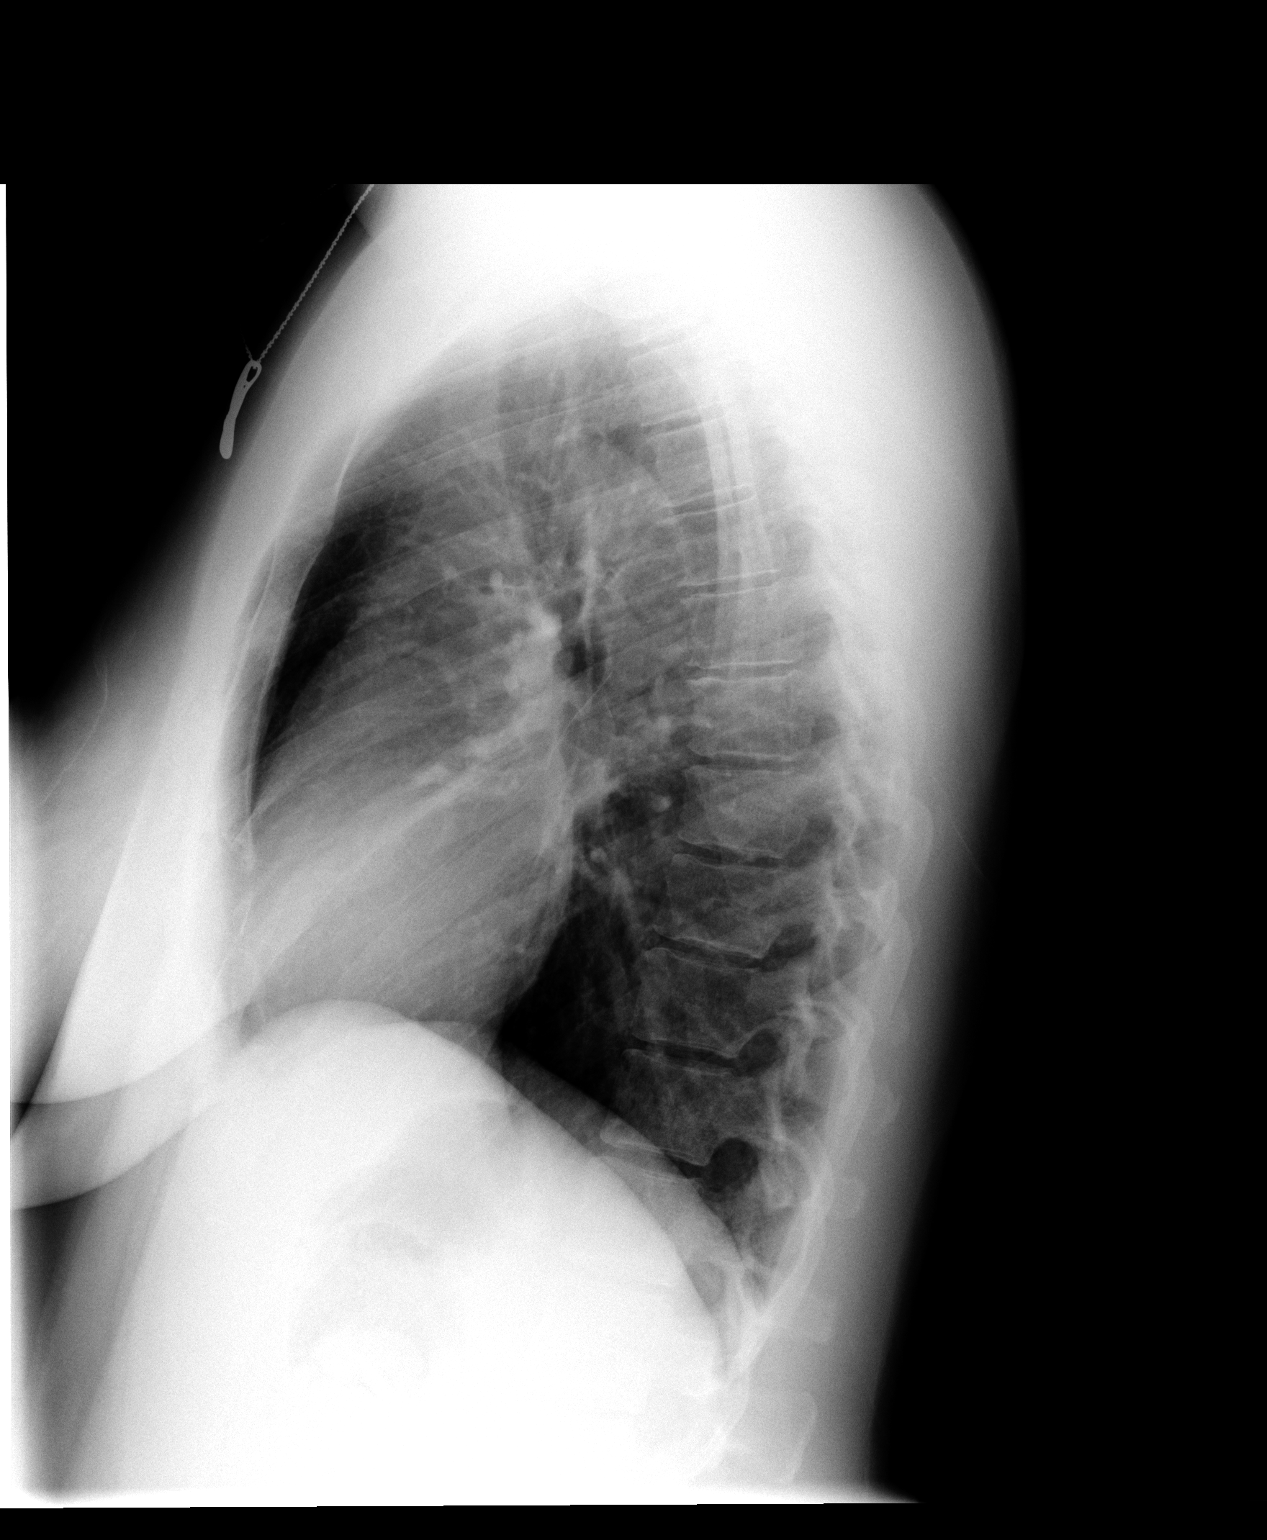

[2 of 2 positions shown; findings below may reference images not displayed]

FINDINGS: Lungs are clear.  Heart size is normal.  No pneumothorax
or effusion.
IMPRESSION: Negative chest.

## 2014-05-07 ENCOUNTER — Other Ambulatory Visit: Payer: Self-pay | Admitting: Obstetrics and Gynecology

## 2014-05-07 ENCOUNTER — Ambulatory Visit
Admission: RE | Admit: 2014-05-07 | Discharge: 2014-05-07 | Disposition: A | Discharge status: Home / Self Care | Payer: BC Managed Care – PPO | Source: Ambulatory Visit | Attending: Obstetrics and Gynecology | Admitting: Obstetrics and Gynecology

## 2014-05-07 DIAGNOSIS — R61 Generalized hyperhidrosis: Secondary | ICD-10-CM

## 2015-03-27 ENCOUNTER — Ambulatory Visit (INDEPENDENT_AMBULATORY_CARE_PROVIDER_SITE_OTHER): Payer: BLUE CROSS/BLUE SHIELD | Admitting: Urgent Care

## 2015-03-27 VITALS — BP 122/70 | HR 80 | Temp 98.9°F | Resp 18 | Ht 65.5 in | Wt 178.0 lb

## 2015-03-27 DIAGNOSIS — L299 Pruritus, unspecified: Secondary | ICD-10-CM

## 2015-03-27 DIAGNOSIS — R21 Rash and other nonspecific skin eruption: Secondary | ICD-10-CM | POA: Diagnosis not present

## 2015-03-27 DIAGNOSIS — R220 Localized swelling, mass and lump, head: Secondary | ICD-10-CM | POA: Diagnosis not present

## 2015-03-27 MED ORDER — LEVOCETIRIZINE DIHYDROCHLORIDE 5 MG PO TABS: 5.0000 mg | ORAL_TABLET | Freq: Every evening | ORAL | Status: DC

## 2015-03-27 MED ORDER — PREDNISONE 10 MG PO TABS: ORAL_TABLET | ORAL | Status: DC

## 2015-03-27 NOTE — Progress Notes (Signed)
    MRN: [id number] DOB: 10-01-1970  Subjective:   Robin Knapp is a 44 y.o. female presenting for chief complaint of Allergic Reaction and Facial Swelling  Reports 1 day history of hives after going to the pool. Has tried Benadryl yesterday night with some relief. This morning patient had ongoing itching, eyelid swelling, lips swollen. 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 She has since had intermittent episodes of this when she spends time out in the sun especially in the summer, the current episode being the worst since she last saw a dermatologist. Of note, patient has allergic reactions to sun block, has tried multiple types. Denies fever, tongue swelling, throat closing, shortness of breath, chest tightness, nausea, vomiting, abdominal pain. Denies any other aggravating or relieving factors, no other questions or concerns.  Ayra has a current medication list which includes the following prescription(s): levalbuterol. She is allergic to sulfa antibiotics.  Jessilyn  has a past medical history of Asthma; GERD (gastroesophageal reflux disease); Missed abortion; PCOS (polycystic ovarian syndrome); and Allergy. Also  has past surgical history that includes Cesarean section; LASIK; [place]; Wisdom tooth extraction (1994); Dilation and evacuation (05/09/2012); and Eye surgery.  ROS As in subjective.  Objective:   Vitals: BP 122/70 mmHg  Pulse 80  Temp(Src) 98.9 F (37.2 C) (Oral)  Resp 18  Ht 5' 5.5" (1.664 m)  Wt 178 lb (80.74 kg)  BMI 29.16 kg/m2  SpO2 98%  Physical Exam  Constitutional: She is oriented to person, place, and time. She appears well-developed and well-nourished.  HENT:  TM's intact bilaterally, no effusions or erythema. No ear swelling. Nares patent, nasal turbinates pink and moist. No sinus tenderness. Oropharynx clear, mucous membranes moist, dentition  in good repair. Both lips are swollen.  Eyes: Conjunctivae are normal. Right eye exhibits no discharge. Left eye exhibits no discharge. No scleral icterus.  Eyelid swelling.  Neck: Normal range of motion.  Posterior occipital adenopathy right side.  Cardiovascular: Normal rate, regular rhythm and intact distal pulses.  Exam reveals no gallop and no friction rub.   No murmur heard. Pulmonary/Chest: No respiratory distress. She has no wheezes. She has no rales.  Musculoskeletal: She exhibits no edema.  Lymphadenopathy:    She has no cervical adenopathy.  Neurological: She is alert and oriented to person, place, and time.  Skin: Skin is warm and dry. Rash (macupapular rash in clusters throughout face) noted. There is erythema (over face, neck and shoulders bilaterally). No pallor.  Psychiatric: She has a normal mood and affect.   Assessment and Plan :   1. Rash and nonspecific skin eruption 2. Facial swelling 3. Swelling of both lips 4. Itching - Unclear etiology, per UpToDate, there is a very small incidence of solar urticaria. However, I will defer to her previous dermatology recommendations and order urgent referral. - I also recommended referral to allergist given patient's history of allergies, patient stated she would try to get in touch with her allergist but if she was unable to would let me know to request referral. - Will start steroid taper, start Xyzal for allergic response to unknown offending agent.  Jaynee Eagles, PA-C [facility] [phone] 03/27/2015 8:32 AM

## 2015-03-27 NOTE — Patient Instructions (Signed)

## 2015-04-06 ENCOUNTER — Other Ambulatory Visit (HOSPITAL_COMMUNITY)
Admission: RE | Admit: 2015-04-06 | Discharge: 2015-04-06 | Disposition: A | Discharge status: Home / Self Care | Payer: BLUE CROSS/BLUE SHIELD | Source: Ambulatory Visit | Attending: Dermatology | Admitting: Dermatology

## 2015-04-06 DIAGNOSIS — R21 Rash and other nonspecific skin eruption: Secondary | ICD-10-CM | POA: Diagnosis not present

## 2015-04-06 LAB — COMPREHENSIVE METABOLIC PANEL
ALK PHOS: 31 U/L — AB (ref 38–126)
ALT: 17 U/L (ref 14–54)
ANION GAP: 4 — AB (ref 5–15)
AST: 14 U/L — AB (ref 15–41)
Albumin: 4.5 g/dL (ref 3.5–5.0)
BUN: 20 mg/dL (ref 6–20)
CO2: 28 mmol/L (ref 22–32)
Calcium: 8.7 mg/dL — ABNORMAL LOW (ref 8.9–10.3)
Chloride: 103 mmol/L (ref 101–111)
Creatinine, Ser: 1.64 mg/dL — ABNORMAL HIGH (ref 0.44–1.00)
GFR calc Af Amer: 44 mL/min — ABNORMAL LOW (ref >60)
GFR calc non Af Amer: 38 mL/min — ABNORMAL LOW (ref >60)
Glucose, Bld: 91 mg/dL (ref 65–99)
POTASSIUM: 3.8 mmol/L (ref 3.5–5.1)
Sodium: 135 mmol/L (ref 135–145)
Total Bilirubin: 0.8 mg/dL (ref 0.3–1.2)
Total Protein: 7.8 g/dL (ref 6.5–8.1)

## 2015-04-06 LAB — CBC WITH DIFFERENTIAL/PLATELET
Basophils Absolute: 0 10*3/uL (ref 0.0–0.1)
Basophils Relative: 0 % (ref 0–1)
EOS ABS: 0.2 10*3/uL (ref 0.0–0.7)
EOS PCT: 2 % (ref 0–5)
HEMATOCRIT: 41.2 % (ref 36.0–46.0)
Hemoglobin: 13.9 g/dL (ref 12.0–15.0)
Lymphocytes Relative: 27 % (ref 12–46)
Lymphs Abs: 2.8 10*3/uL (ref 0.7–4.0)
MCH: 30.3 pg (ref 26.0–34.0)
MCHC: 33.7 g/dL (ref 30.0–36.0)
MCV: 90 fL (ref 78.0–100.0)
MONOS PCT: 6 % (ref 3–12)
Monocytes Absolute: 0.6 10*3/uL (ref 0.1–1.0)
NEUTROS ABS: 6.8 10*3/uL (ref 1.7–7.7)
NEUTROS PCT: 65 % (ref 43–77)
Platelets: 343 10*3/uL (ref 150–400)
RBC: 4.58 MIL/uL (ref 3.87–5.11)
RDW: 13.6 % (ref 11.5–15.5)
WBC: 10.4 10*3/uL (ref 4.0–10.5)

## 2015-04-06 LAB — URINALYSIS, ROUTINE W REFLEX MICROSCOPIC
Bilirubin Urine: NEGATIVE
GLUCOSE, UA: NEGATIVE mg/dL
Hgb urine dipstick: NEGATIVE
KETONES UR: NEGATIVE mg/dL
LEUKOCYTES UA: NEGATIVE
Nitrite: NEGATIVE
PH: 6.5 (ref 5.0–8.0)
Protein, ur: NEGATIVE mg/dL
Specific Gravity, Urine: 1.025 (ref 1.005–1.030)
Urobilinogen, UA: 0.2 mg/dL (ref 0.0–1.0)

## 2015-04-07 LAB — ANTINUCLEAR ANTIBODIES, IFA: ANA Ab, IFA: NEGATIVE

## 2015-04-11 LAB — MISCELLANEOUS TEST

## 2015-09-01 IMAGING — CR DG CHEST 2V
2 series · 2 of 2 positions shown · non-contrast
Comparison: 01/05/2014; 07/06/2002

CLINICAL DATA: Night sweats, nonsmoker.

EXAM:
CHEST  2 VIEW

[view not recorded (1 of 2)]
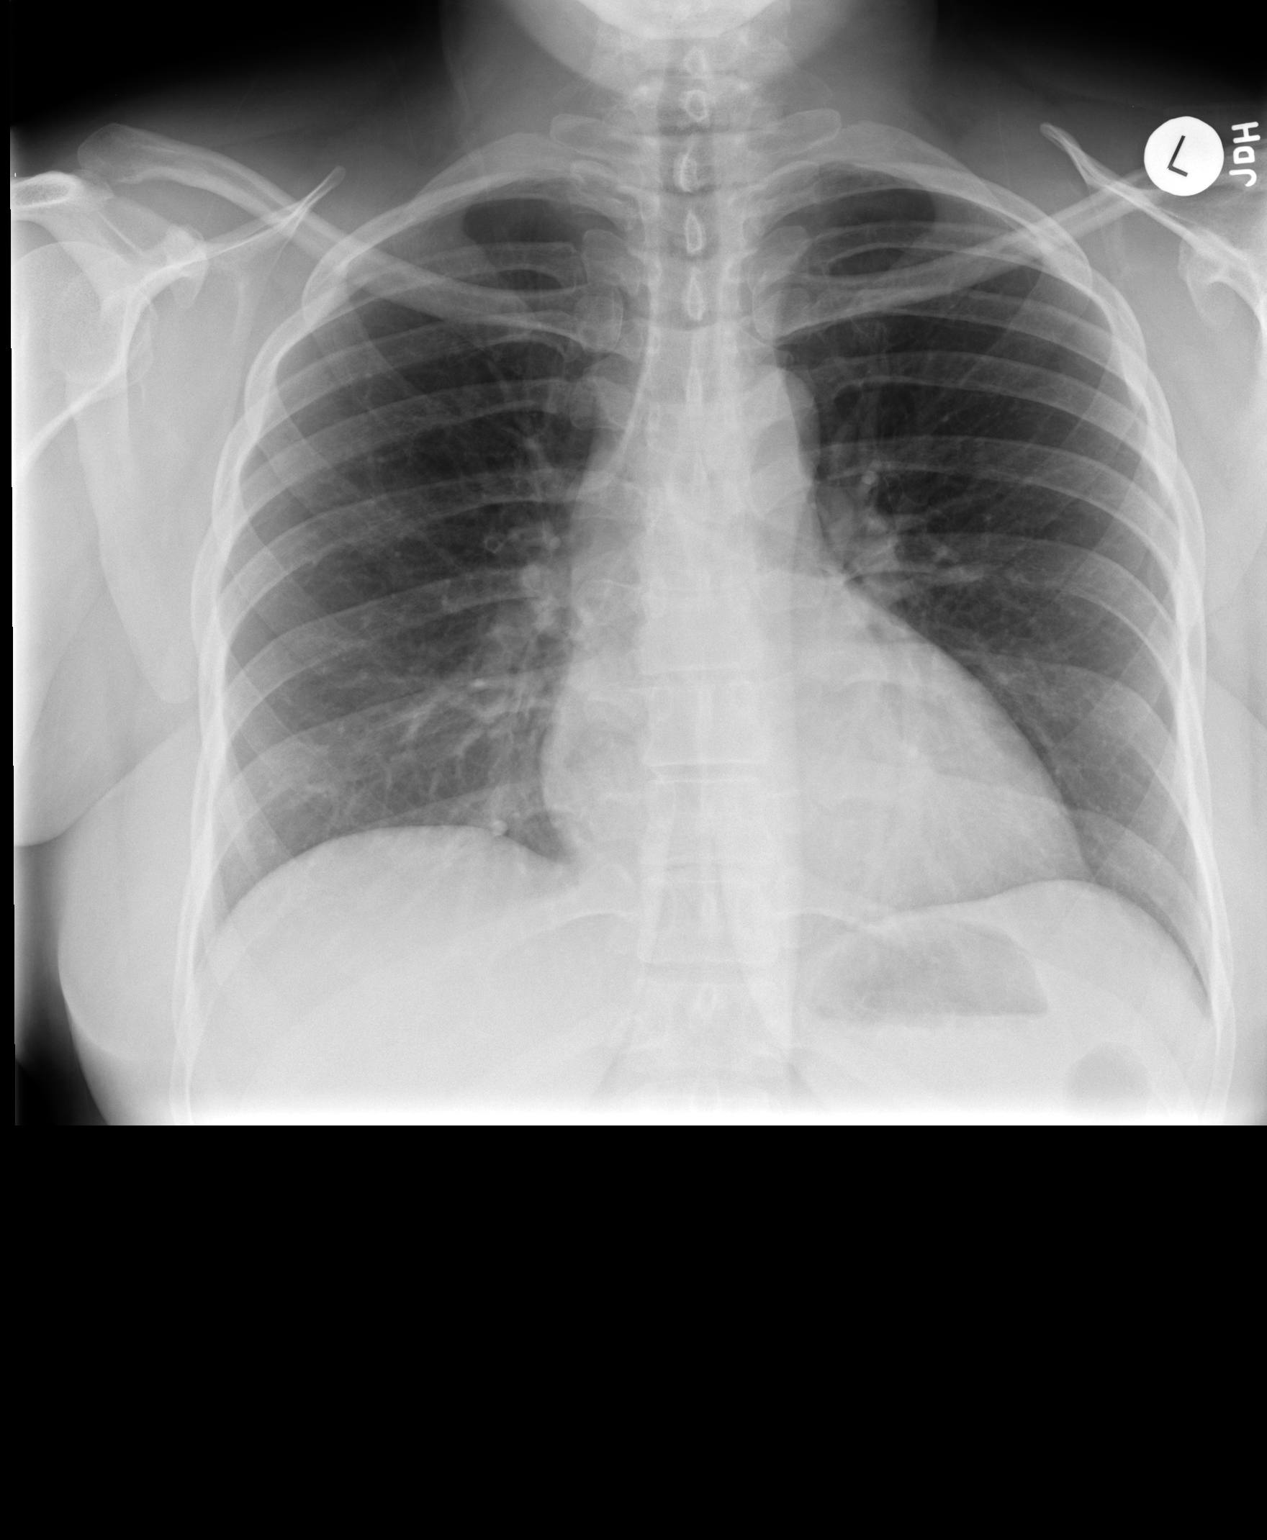

[view not recorded (2 of 2)]
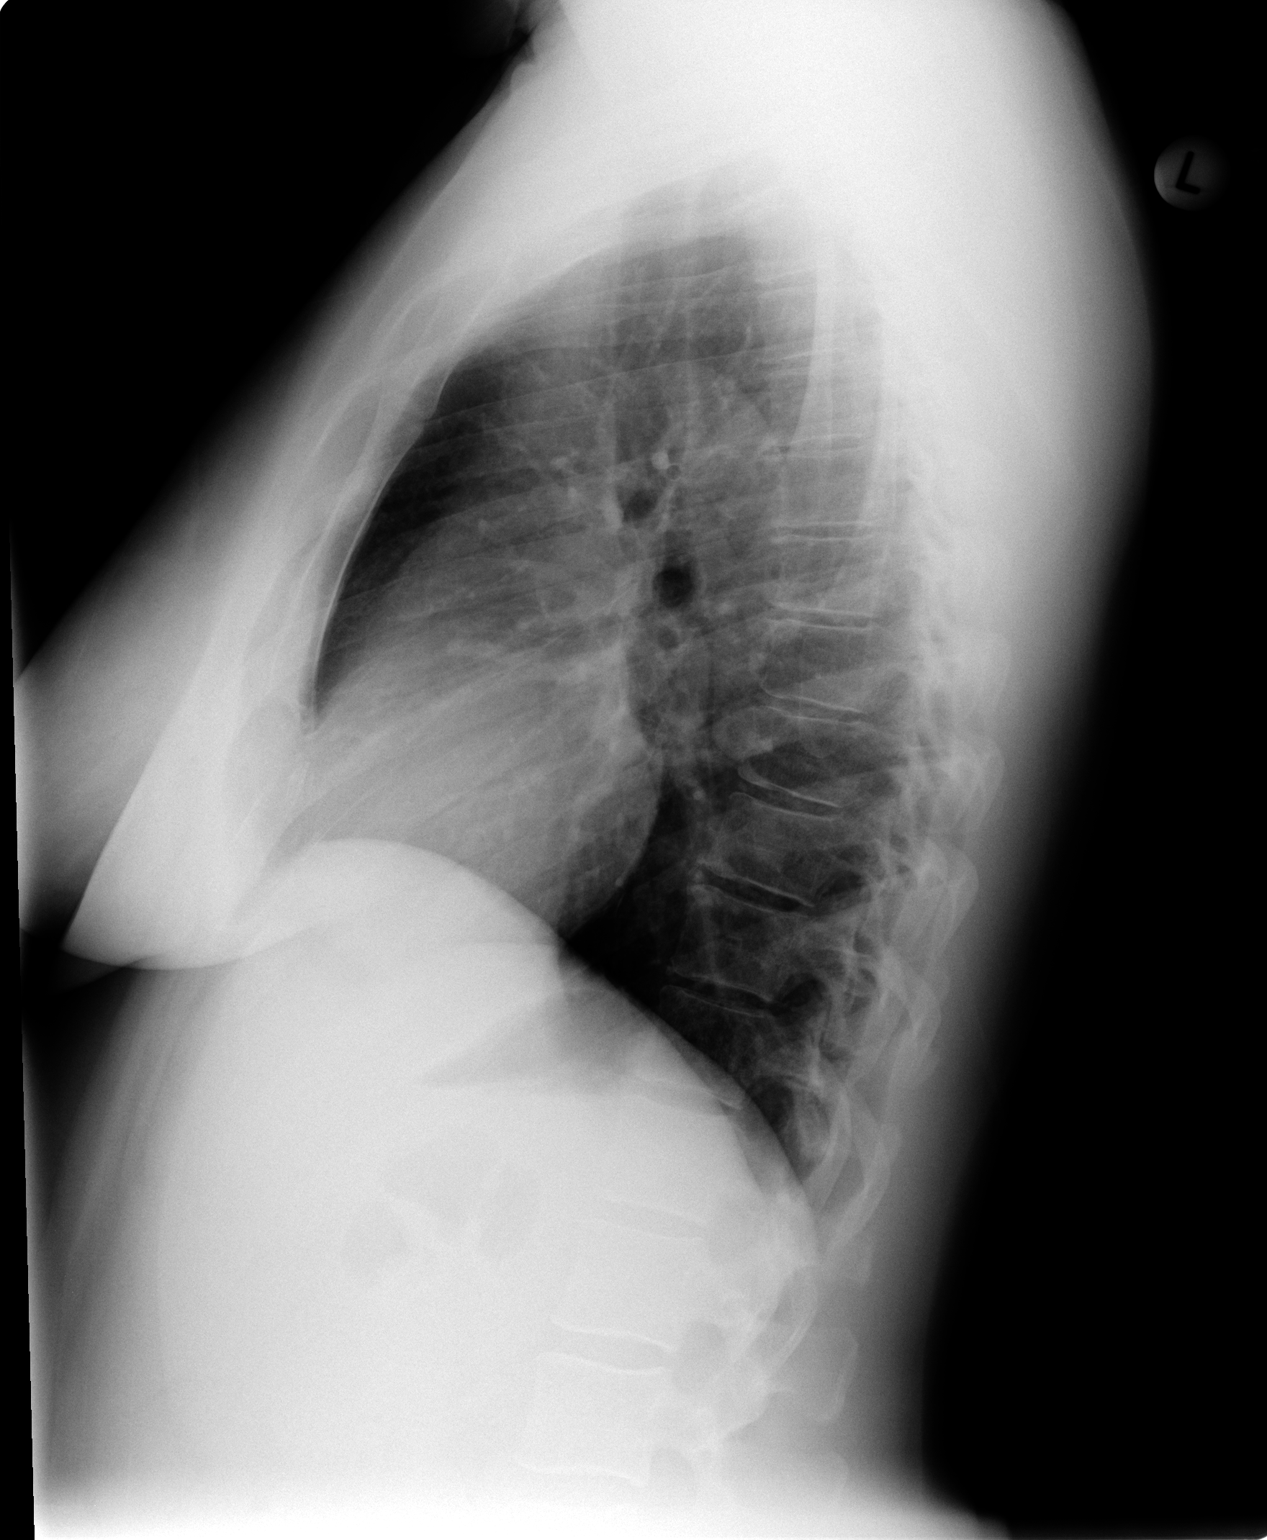

[2 of 2 positions shown; findings below may reference images not displayed]

FINDINGS: Borderline enlarged cardiac silhouette, minimal increased in the
interval. Normal mediastinal contours. No focal airspace opacities.
No pleural effusion or pneumothorax. No evidence of edema. No acute
osseus abnormalities.
IMPRESSION: 1. Interval apparent enlargement of the cardiac silhouette. Further
evaluation with cardiac echo could be performed as clinically
indicated.
2. Otherwise, no acute cardiopulmonary disease. Specifically, no
evidence of pneumonia.

## 2017-05-23 DIAGNOSIS — Z1231 Encounter for screening mammogram for malignant neoplasm of breast: Secondary | ICD-10-CM | POA: Diagnosis not present

## 2017-06-21 MED FILL — NITROFURANTOIN MONO-MCR 100: 100 | 5 days supply | Qty: 10 | Fill #0

## 2017-08-02 DIAGNOSIS — Z1151 Encounter for screening for human papillomavirus (HPV): Secondary | ICD-10-CM | POA: Diagnosis not present

## 2017-08-02 DIAGNOSIS — Z1329 Encounter for screening for other suspected endocrine disorder: Secondary | ICD-10-CM | POA: Diagnosis not present

## 2017-08-02 DIAGNOSIS — Z01419 Encounter for gynecological examination (general) (routine) without abnormal findings: Secondary | ICD-10-CM | POA: Diagnosis not present

## 2017-08-02 DIAGNOSIS — Z6832 Body mass index (BMI) 32.0-32.9, adult: Secondary | ICD-10-CM | POA: Diagnosis not present

## 2017-08-02 DIAGNOSIS — Z131 Encounter for screening for diabetes mellitus: Secondary | ICD-10-CM | POA: Diagnosis not present

## 2017-08-02 DIAGNOSIS — Z13220 Encounter for screening for lipoid disorders: Secondary | ICD-10-CM | POA: Diagnosis not present

## 2017-08-02 DIAGNOSIS — Z0000 Encounter for general adult medical examination without abnormal findings: Secondary | ICD-10-CM | POA: Diagnosis not present

## 2017-08-02 DIAGNOSIS — Z130 Encounter for screening for diseases of the blood and blood-forming organs and certain disorders involving the immune mechanism: Secondary | ICD-10-CM | POA: Diagnosis not present

## 2017-08-07 DIAGNOSIS — N6323 Unspecified lump in the left breast, lower outer quadrant: Secondary | ICD-10-CM | POA: Diagnosis not present

## 2017-08-07 DIAGNOSIS — N6002 Solitary cyst of left breast: Secondary | ICD-10-CM | POA: Diagnosis not present

## 2017-08-07 DIAGNOSIS — N6321 Unspecified lump in the left breast, upper outer quadrant: Secondary | ICD-10-CM | POA: Diagnosis not present

## 2017-08-15 ENCOUNTER — Other Ambulatory Visit: Payer: Self-pay | Admitting: Radiology

## 2017-08-15 DIAGNOSIS — D242 Benign neoplasm of left breast: Secondary | ICD-10-CM | POA: Diagnosis not present

## 2017-11-13 ENCOUNTER — Encounter (INDEPENDENT_AMBULATORY_CARE_PROVIDER_SITE_OTHER): Payer: Self-pay

## 2017-11-26 ENCOUNTER — Ambulatory Visit (INDEPENDENT_AMBULATORY_CARE_PROVIDER_SITE_OTHER): Payer: BLUE CROSS/BLUE SHIELD | Admitting: Family Medicine

## 2017-11-26 ENCOUNTER — Encounter (INDEPENDENT_AMBULATORY_CARE_PROVIDER_SITE_OTHER): Payer: Self-pay

## 2017-11-26 ENCOUNTER — Ambulatory Visit (INDEPENDENT_AMBULATORY_CARE_PROVIDER_SITE_OTHER): Payer: 59 | Admitting: Family Medicine

## 2017-11-27 ENCOUNTER — Encounter (INDEPENDENT_AMBULATORY_CARE_PROVIDER_SITE_OTHER): Payer: Self-pay | Admitting: Family Medicine

## 2017-11-27 ENCOUNTER — Ambulatory Visit (INDEPENDENT_AMBULATORY_CARE_PROVIDER_SITE_OTHER): Payer: 59 | Admitting: Family Medicine

## 2017-11-27 VITALS — BP 127/82 | HR 70 | Temp 97.9°F | Ht 65.0 in | Wt 199.0 lb

## 2017-11-27 DIAGNOSIS — Z0289 Encounter for other administrative examinations: Secondary | ICD-10-CM

## 2017-11-27 DIAGNOSIS — R5383 Other fatigue: Secondary | ICD-10-CM

## 2017-11-27 DIAGNOSIS — E559 Vitamin D deficiency, unspecified: Secondary | ICD-10-CM

## 2017-11-27 DIAGNOSIS — Z1331 Encounter for screening for depression: Secondary | ICD-10-CM

## 2017-11-27 DIAGNOSIS — Z9189 Other specified personal risk factors, not elsewhere classified: Secondary | ICD-10-CM

## 2017-11-27 DIAGNOSIS — R0602 Shortness of breath: Secondary | ICD-10-CM

## 2017-11-27 DIAGNOSIS — E282 Polycystic ovarian syndrome: Secondary | ICD-10-CM

## 2017-11-27 DIAGNOSIS — Z6833 Body mass index (BMI) 33.0-33.9, adult: Secondary | ICD-10-CM

## 2017-11-27 DIAGNOSIS — E669 Obesity, unspecified: Secondary | ICD-10-CM

## 2017-11-27 DIAGNOSIS — E66811 Obesity, class 1: Secondary | ICD-10-CM

## 2017-11-27 NOTE — Progress Notes (Signed)
.  Office: [phone]  /  Fax: [phone]   HPI:   Chief Complaint: OBESITY  Robin Knapp (MR# [id number]) is a 47 y.o. female who presents on 11/27/2017 for obesity evaluation and treatment. Current BMI is Body mass index is 33.12 kg/m.[name] Robin Knapp has struggled with obesity for years and has been unsuccessful in either losing weight or maintaining long term weight loss. Robin Knapp attended our information session and states she is currently in the action stage of change and ready to dedicate time achieving and maintaining a healthier weight.  Robin Knapp heard about our clinic from a co-worker.   Robin Knapp states her family eats meals together she thinks her family will eat healthier with  her she struggles with family and or coworkers weight loss sabotage her desired weight loss is 54 lbs she started gaining weight in mid 30's her heaviest weight ever was 204 lbs she is a picky eater and doesn't like to eat healthier foods  she has significant food cravings issues  she skips meals frequently she is frequently drinking liquids with calories she frequently makes poor food choices she frequently eats larger portions than normal  she struggles with emotional eating    Fatigue Robin Knapp feels her energy is lower than it should be. This has worsened with weight gain and has not worsened recently. Robin Knapp admits to daytime somnolence and  admits to waking up still tired. Patient is at risk for obstructive sleep apnea. Patent has a history of symptoms of daytime fatigue and morning headache. Patient generally gets 6 hours of sleep per night, and states they generally have nightime awakenings. Snoring is present. Apneic episodes are not present. Epworth Sleepiness Score is 21.  Dyspnea on exertion Robin Knapp notes increasing shortness of breath with exercising and seems to be worsening over time with weight gain. She notes getting out of breath sooner with activity than she used to. This has not  gotten worse recently. EKG within normal limits but end of strip with significant artifact. Robin Knapp denies orthopnea.  Vitamin D Deficiency Robin Knapp has a diagnosis of vitamin D deficiency. She on current supplement 10,000 per day. She denies nausea, vomiting or muscle weakness.  At risk for osteopenia and osteoporosis Robin Knapp is at higher risk of osteopenia and osteoporosis due to vitamin D deficiency.   Polycsytic Ovary Syndrome Robin Knapp has a diagnosis of polycystic ovary syndrome. She notes acanthosis nigricans.  Depression Screen Robin Knapp's Food and Mood (modified PHQ-9) score was  Depression screen PHQ 2/9 11/27/2017  Decreased Interest 1  Down, Depressed, Hopeless 3  PHQ - 2 Score 4  Altered sleeping 3  Tired, decreased energy 3  Change in appetite 3  Feeling bad or failure about yourself  3  Trouble concentrating 0  Moving slowly or fidgety/restless 0  Suicidal thoughts 0  PHQ-9 Score 16  Difficult doing work/chores Very difficult    ALLERGIES: Allergies  Allergen Reactions  . Sulfa Antibiotics Hives    itching  . Sunscreens     MEDICATIONS: Current Outpatient Medications on File Prior to Visit  Medication Sig Dispense Refill  . acetaminophen (TYLENOL 8 HOUR ARTHRITIS PAIN) 650 MG CR tablet Take 650 mg by mouth every 8 (eight) hours as needed for pain.    [name] albuterol (PROVENTIL HFA;VENTOLIN HFA) 108 (90 Base) MCG/ACT inhaler Inhale 2 puffs into the lungs every 6 (six) hours as needed for wheezing or shortness of breath.    . cetirizine (ZYRTEC) 10 MG tablet Take 10 mg by mouth daily.    [name]  Cholecalciferol ([employer] VITAMIN D3) 10000 units CAPS Take by mouth.    [name] ibuprofen (ADVIL,MOTRIN) 600 MG tablet Take 600 mg by mouth every 6 (six) hours as needed.    [name] levonorgestrel (MIRENA) 20 MCG/24HR IUD 1 each by Intrauterine route once.    . Magnesium 250 MG TABS Take 1 tablet by mouth daily.    . montelukast (SINGULAIR) 10 MG tablet Take 10 mg by mouth at bedtime.    .  Multiple Vitamins-Minerals (HAIR SKIN & NAILS ADVANCED PO) Take 1 tablet by mouth daily.    . Omega-3 Fatty Acids (OMEGA-3 FISH OIL) 1200 MG CAPS Take 2 capsules by mouth daily.    . Oral Electrolytes (REVITAL JELL CUPS PO) Take 1 capsule by mouth daily.     No current facility-administered medications on file prior to visit.     PAST MEDICAL HISTORY: Past Medical History:  Diagnosis Date  . Allergy   . Asthma   . Breast lump   . Constipation   . Dry skin   . Fatigue   . GERD (gastroesophageal reflux disease)    pregnancy related-zantac  . HTN (hypertension)   . Hyperlipidemia   . Infertility, female   . Joint pain   . Lactose intolerance   . Missed abortion    6weeks 3days  . Muscle pain   . Obesity   . Palpitations   . PCOS (polycystic ovarian syndrome)   . Seasonal allergies   . Skin rash   . Stress   . Vitamin D deficiency     PAST SURGICAL HISTORY: Past Surgical History:  Procedure Laterality Date  . BREAST BIOPSY    . CESAREAN SECTION    . DILATION AND EVACUATION  05/09/2012   Procedure: DILATATION AND EVACUATION;  Surgeon: Marvene Staff, MD;  Location: [facility];  Service: Gynecology;  Laterality: N/A;  . EYE SURGERY    . LASIK    . [place]    . WISDOM TOOTH EXTRACTION  1994    SOCIAL HISTORY: Social History   Tobacco Use  . Smoking status: Never Smoker  Substance Use Topics  . Alcohol use: No    Alcohol/week: 0.0 oz  . Drug use: No    FAMILY HISTORY: Family History  Problem Relation Age of Onset  . Hyperlipidemia Mother   . Hyperlipidemia Father   . Hypertension Father   . Anxiety disorder Father   . Hyperlipidemia Maternal Grandmother   . Hypertension Maternal Grandmother   . Heart disease Maternal Grandmother   . Heart disease Maternal Grandfather   . Diabetes Maternal Grandfather   . Stroke Paternal Grandmother   . Mental illness Paternal Grandmother   . Heart disease Paternal Grandmother     ROS: Review of Systems    Constitutional: Positive for malaise/fatigue. Negative for weight loss.       + Breast lumps  HENT:       + Nasal Stuffiness  Eyes:       + Wear glasses or contacts  Respiratory: Positive for shortness of breath (with exertion).   Cardiovascular: Negative for orthopnea.  Gastrointestinal: Negative for nausea and vomiting.  Musculoskeletal:       Negative muscle weakness + Muscle or joint pain  Skin: Positive for itching and rash.       + Dryness  Neurological: Positive for headaches.  Psychiatric/Behavioral: Positive for depression. Negative for suicidal ideas.       + Stress    PHYSICAL EXAM: Blood pressure 127/82, pulse  70, temperature 97.9 F (36.6 C), temperature source Oral, height 5\' 5"  (1.651 m), weight 199 lb (90.3 kg), SpO2 100 %. Body mass index is 33.12 kg/m. Physical Exam  Constitutional: She is oriented to person, place, and time. She appears well-developed and well-nourished.  HENT:  Head: Normocephalic and atraumatic.  Nose: Nose normal.  Eyes: EOM are normal. No scleral icterus.  Neck: Normal range of motion. Neck supple. No thyromegaly present.  Cardiovascular: Normal rate and regular rhythm.  Murmur (2/6 systolic murmur heard best at aortic area) heard. Pulmonary/Chest: Effort normal. No respiratory distress.  Abdominal: Soft. There is no tenderness.  + Obesity  Musculoskeletal:  Range of Motion normal in all 4 extremities Trace edema noted in bilateral lower extremities  Neurological: She is alert and oriented to person, place, and time. Coordination normal.  Skin: Skin is warm and dry.  + Acanthosis nigricans  Psychiatric: She has a normal mood and affect. Her behavior is normal.  Vitals reviewed.   RECENT LABS AND TESTS: BMET    Component Value Date/Time   NA 135 04/06/2015 1715   K 3.8 04/06/2015 1715   CL 103 04/06/2015 1715   CO2 28 04/06/2015 1715   GLUCOSE 91 04/06/2015 1715   BUN 20 04/06/2015 1715   CREATININE 1.64 (H) 04/06/2015  1715   CALCIUM 8.7 (L) 04/06/2015 1715   GFRNONAA 38 (L) 04/06/2015 1715   GFRAA 44 (L) 04/06/2015 1715   No results found for: HGBA1C No results found for: INSULIN CBC    Component Value Date/Time   WBC 10.4 04/06/2015 1715   RBC 4.58 04/06/2015 1715   HGB 13.9 04/06/2015 1715   HCT 41.2 04/06/2015 1715   PLT 343 04/06/2015 1715   MCV 90.0 04/06/2015 1715   MCH 30.3 04/06/2015 1715   MCHC 33.7 04/06/2015 1715   RDW 13.6 04/06/2015 1715   LYMPHSABS 2.8 04/06/2015 1715   MONOABS 0.6 04/06/2015 1715   EOSABS 0.2 04/06/2015 1715   BASOSABS 0.0 04/06/2015 1715   Iron/TIBC/Ferritin/ %Sat No results found for: IRON, TIBC, FERRITIN, IRONPCTSAT Lipid Panel  No results found for: CHOL, TRIG, HDL, CHOLHDL, VLDL, LDLCALC, LDLDIRECT Hepatic Function Panel     Component Value Date/Time   PROT 7.8 04/06/2015 1715   ALBUMIN 4.5 04/06/2015 1715   AST 14 (L) 04/06/2015 1715   ALT 17 04/06/2015 1715   ALKPHOS 31 (L) 04/06/2015 1715   BILITOT 0.8 04/06/2015 1715    Vitamin D No recent labs  ECG  shows NSR with a rate of 77 BPM INDIRECT CALORIMETER done today shows a VO2 of 201 and a REE of 1400. Her calculated basal metabolic rate is 2440 thus her basal metabolic rate is worse than expected.    ASSESSMENT AND PLAN: Other fatigue - Plan: EKG 12-Lead, Vitamin B12, CBC With Differential, Comprehensive metabolic panel, Folate, Hemoglobin A1c, Lipid Panel With LDL/HDL Ratio, T3, T4, free, TSH  Shortness of breath on exertion - Plan: CBC With Differential  Vitamin D deficiency - Plan: VITAMIN D 25 Hydroxy (Vit-D Deficiency, Fractures)  PCOS (polycystic ovarian syndrome) - Plan: Insulin, random  Depression screening  At risk for osteoporosis  Class 1 obesity with serious comorbidity and body mass index (BMI) of 33.0 to 33.9 in adult, unspecified obesity type  PLAN:  Fatigue Yumi was informed that her fatigue may be related to obesity, depression or many other causes. Labs  will be ordered, and in the meanwhile Addelyn has agreed to work on diet, exercise and weight loss  to help with fatigue. Proper sleep hygiene was discussed including the need for 7-8 hours of quality sleep each night. A sleep study was not ordered based on symptoms and Epworth score.  Dyspnea on exertion Sarahanne's shortness of breath appears to be obesity related and exercise induced. She has agreed to work on weight loss and gradually increase exercise to treat her exercise induced shortness of breath. If Aleja follows our instructions and loses weight without improvement of her shortness of breath, we will plan to refer to pulmonology. We will monitor this condition regularly. Rafeef agrees to this plan.  Depression Screen Zamaya had a strongly positive depression screening. Depression is commonly associated with obesity and often results in emotional eating behaviors. We will monitor this closely and work on CBT to help improve the non-hunger eating patterns. Referral to Psychology may be required if no improvement is seen as she continues in our clinic.  Vitamin D Deficiency Lynsee was informed that low vitamin D levels contributes to fatigue and are associated with obesity, breast, and colon cancer. She will follow up for routine testing of vitamin D, at least 2-3 times per year. She was informed of the risk of over-replacement of vitamin D and agrees to not increase her dose unless she discusses this with [name] first. We will check labs and Ynez agrees to follow up with our clinic in 2 weeks.  At risk for osteopenia and osteoporosis Nakeisha is at risk for osteopenia and osteoporsis due to her vitamin D deficiency. She was encouraged to take her vitamin D and follow her higher calcium diet and increase strengthening exercise to help strengthen her bones and decrease her risk of osteopenia and osteoporosis.  Polycsytic Ovary Syndrome We will check labs and Brittan agrees to follow up with  our clinic in 2 weeks.  Obesity Monseratt is currently in the action stage of change and her goal is to continue with weight loss efforts She has agreed to follow the Category 2 plan Nakya has been instructed to work up to a goal of 150 minutes of combined cardio and strengthening exercise per week for weight loss and overall health benefits. We discussed the following Behavioral Modification Stratagies today: increasing lean protein intake, work on meal planning and easy cooking plans, dealing with family or coworker sabotage, keeping healthy foods in the home, and planning for success  Kaelyn has agreed to follow up with our clinic in 2 weeks. She was informed of the importance of frequent follow up visits to maximize her success with intensive lifestyle modifications for her multiple health conditions. She was informed we would discuss her lab results at her next visit unless there is a critical issue that needs to be addressed sooner. Alexas agreed to keep her next visit at the agreed upon time to discuss these results.    OBESITY BEHAVIORAL INTERVENTION VISIT  Today's visit was # 1 out of 22.  Starting weight: 199 lbs Starting date: 11/27/17 Today's weight : 199 lbs Today's date: 11/27/2017 Total lbs lost to date: 0 (Patients must lose 7 lbs in the first 6 months to continue with counseling)   ASK: We discussed the diagnosis of obesity with Graceann Congress today and Vale agreed to give [name] permission to discuss obesity behavioral modification therapy today.  ASSESS: Zoila has the diagnosis of obesity and her BMI today is 33.12 Dabria is in the action stage of change   ADVISE: Demeisha was educated on the multiple health risks of obesity as well  as the benefit of weight loss to improve her health. She was advised of the need for long term treatment and the importance of lifestyle modifications.  AGREE: Multiple dietary modification options and treatment options were  discussed and  Twanda agreed to the above obesity treatment plan.   I, Trixie Dredge, am acting as transcriptionist for Ilene Qua, MD  I have reviewed the above documentation for accuracy and completeness, and I agree with the above. - Ilene Qua, MD

## 2017-11-28 LAB — COMPREHENSIVE METABOLIC PANEL
ALK PHOS: 42 IU/L (ref 39–117)
ALT: 20 IU/L (ref 0–32)
AST: 20 IU/L (ref 0–40)
Albumin/Globulin Ratio: 1.6 (ref 1.2–2.2)
Albumin: 4.8 g/dL (ref 3.5–5.5)
BUN/Creatinine Ratio: 12 (ref 9–23)
BUN: 11 mg/dL (ref 6–24)
Bilirubin Total: 0.4 mg/dL (ref 0.0–1.2)
CO2: 24 mmol/L (ref 20–29)
Calcium: 9.7 mg/dL (ref 8.7–10.2)
Chloride: 99 mmol/L (ref 96–106)
Creatinine, Ser: 0.89 mg/dL (ref 0.57–1.00)
GFR calc Af Amer: 91 mL/min/{1.73_m2} (ref >59)
GFR calc non Af Amer: 79 mL/min/{1.73_m2} (ref >59)
GLUCOSE: 84 mg/dL (ref 65–99)
Globulin, Total: 3 g/dL (ref 1.5–4.5)
Potassium: 4.3 mmol/L (ref 3.5–5.2)
Sodium: 139 mmol/L (ref 134–144)
Total Protein: 7.8 g/dL (ref 6.0–8.5)

## 2017-11-28 LAB — CBC WITH DIFFERENTIAL
Basophils Absolute: 0 10*3/uL (ref 0.0–0.2)
Basos: 0 %
EOS (ABSOLUTE): 0.2 10*3/uL (ref 0.0–0.4)
Eos: 3 %
Hematocrit: 38.8 % (ref 34.0–46.6)
Hemoglobin: 13.6 g/dL (ref 11.1–15.9)
Immature Grans (Abs): 0 10*3/uL (ref 0.0–0.1)
Immature Granulocytes: 0 %
LYMPHS ABS: 2.2 10*3/uL (ref 0.7–3.1)
Lymphs: 27 %
MCH: 30 pg (ref 26.6–33.0)
MCHC: 35.1 g/dL (ref 31.5–35.7)
MCV: 86 fL (ref 79–97)
Monocytes Absolute: 0.4 10*3/uL (ref 0.1–0.9)
Monocytes: 5 %
NEUTROS ABS: 5.2 10*3/uL (ref 1.4–7.0)
NEUTROS PCT: 65 %
RBC: 4.54 x10E6/uL (ref 3.77–5.28)
RDW: 14.5 % (ref 12.3–15.4)
WBC: 8 10*3/uL (ref 3.4–10.8)

## 2017-11-28 LAB — TSH: TSH: 0.667 u[IU]/mL (ref 0.450–4.500)

## 2017-11-28 LAB — FOLATE: Folate: 14.2 ng/mL (ref >3.0)

## 2017-11-28 LAB — VITAMIN D 25 HYDROXY (VIT D DEFICIENCY, FRACTURES): Vit D, 25-Hydroxy: 40.3 ng/mL (ref 30.0–100.0)

## 2017-11-28 LAB — INSULIN, RANDOM: INSULIN: 12.2 u[IU]/mL (ref 2.6–24.9)

## 2017-11-28 LAB — LIPID PANEL WITH LDL/HDL RATIO
Cholesterol, Total: 240 mg/dL — ABNORMAL HIGH (ref 100–199)
HDL: 44 mg/dL (ref >39)
LDL Calculated: 175 mg/dL — ABNORMAL HIGH (ref 0–99)
LDl/HDL Ratio: 4 ratio — ABNORMAL HIGH (ref 0.0–3.2)
Triglycerides: 107 mg/dL (ref 0–149)
VLDL Cholesterol Cal: 21 mg/dL (ref 5–40)

## 2017-11-28 LAB — VITAMIN B12: VITAMIN B 12: 1226 pg/mL (ref 232–1245)

## 2017-11-28 LAB — HEMOGLOBIN A1C
ESTIMATED AVERAGE GLUCOSE: 117 mg/dL
Hgb A1c MFr Bld: 5.7 % — ABNORMAL HIGH (ref 4.8–5.6)

## 2017-11-28 LAB — T4, FREE: FREE T4: 1.49 ng/dL (ref 0.82–1.77)

## 2017-11-28 LAB — T3: T3 TOTAL: 119 ng/dL (ref 71–180)

## 2017-12-10 DIAGNOSIS — B360 Pityriasis versicolor: Secondary | ICD-10-CM | POA: Diagnosis not present

## 2017-12-10 DIAGNOSIS — E559 Vitamin D deficiency, unspecified: Secondary | ICD-10-CM | POA: Diagnosis not present

## 2017-12-10 DIAGNOSIS — Z0000 Encounter for general adult medical examination without abnormal findings: Secondary | ICD-10-CM | POA: Diagnosis not present

## 2017-12-10 DIAGNOSIS — G5603 Carpal tunnel syndrome, bilateral upper limbs: Secondary | ICD-10-CM | POA: Diagnosis not present

## 2017-12-10 DIAGNOSIS — R030 Elevated blood-pressure reading, without diagnosis of hypertension: Secondary | ICD-10-CM | POA: Diagnosis not present

## 2017-12-10 DIAGNOSIS — E7800 Pure hypercholesterolemia, unspecified: Secondary | ICD-10-CM | POA: Diagnosis not present

## 2017-12-11 ENCOUNTER — Ambulatory Visit (INDEPENDENT_AMBULATORY_CARE_PROVIDER_SITE_OTHER): Payer: 59 | Admitting: Family Medicine

## 2017-12-11 VITALS — BP 120/83 | HR 68 | Temp 97.9°F | Ht 65.0 in | Wt 193.0 lb

## 2017-12-11 DIAGNOSIS — E669 Obesity, unspecified: Secondary | ICD-10-CM | POA: Diagnosis not present

## 2017-12-11 DIAGNOSIS — Z9189 Other specified personal risk factors, not elsewhere classified: Secondary | ICD-10-CM

## 2017-12-11 DIAGNOSIS — Z6832 Body mass index (BMI) 32.0-32.9, adult: Secondary | ICD-10-CM

## 2017-12-11 DIAGNOSIS — R7303 Prediabetes: Secondary | ICD-10-CM | POA: Diagnosis not present

## 2017-12-11 DIAGNOSIS — E559 Vitamin D deficiency, unspecified: Secondary | ICD-10-CM

## 2017-12-11 DIAGNOSIS — E7849 Other hyperlipidemia: Secondary | ICD-10-CM | POA: Diagnosis not present

## 2017-12-11 MED ORDER — METFORMIN HCL 500 MG PO TABS: 500.0000 mg | ORAL_TABLET | Freq: Every day | ORAL | 0 refills | Status: DC

## 2017-12-11 MED FILL — metFORMIN HCL 500 MG TABS: 500 | 30 days supply | Qty: 30 | Fill #0

## 2017-12-11 NOTE — Progress Notes (Signed)
Office: [phone]  /  Fax: [phone]   HPI:   Chief Complaint: OBESITY Robin Knapp is here to discuss her progress with her obesity treatment plan. She is on the Category 2 plan and is following her eating plan approximately 80 to 90 % of the time. She states she is exercising 0 minutes 0 times per week. Robin Knapp felt full all day. She planned ahead for work and found that bringing her lunch was easy. Robin Knapp went to a conference this past weekend and tried to stay close to what was on the plan. Her weight is 193 lb (87.5 kg) today and has had a weight loss of 6 pounds over a period of 2 weeks since her last visit. She has lost 6 lbs since starting treatment with [name].  Pre-Diabetes Robin Knapp has a diagnosis of pre-diabetes based on her elevated Hgb A1c of 5.7 and insulin of 12.2. Robin Knapp was informed this puts her at greater risk of developing diabetes. She is not taking metformin currently and continues to work on diet and exercise to decrease risk of diabetes. She denies nausea or hypoglycemia.  At risk for diabetes Robin Knapp is at higher than average risk for developing diabetes due to her obesity and pre-diabetes. She currently denies polyuria or polydipsia.  Vitamin D deficiency Robin Knapp has a diagnosis of vitamin D deficiency. She is currently taking vit D 10,000 IU daily and denies nausea, vomiting or muscle weakness.  Hyperlipidemia Robin Knapp has hyperlipidemia with LDL of 175 (previously 140). She has been trying to improve her cholesterol levels with intensive lifestyle modification including a low saturated fat diet, exercise and weight loss. She denies any chest pain, claudication or myalgias.   ALLERGIES: Allergies  Allergen Reactions  . Sulfa Antibiotics Hives    itching  . Sunscreens     MEDICATIONS: Current Outpatient Medications on File Prior to Visit  Medication Sig Dispense Refill  . acetaminophen (TYLENOL 8 HOUR ARTHRITIS PAIN) 650 MG CR tablet Take 650 mg by mouth  every 8 (eight) hours as needed for pain.    [name] albuterol (PROVENTIL HFA;VENTOLIN HFA) 108 (90 Base) MCG/ACT inhaler Inhale 2 puffs into the lungs every 6 (six) hours as needed for wheezing or shortness of breath.    . cetirizine (ZYRTEC) 10 MG tablet Take 10 mg by mouth daily.    . Cholecalciferol ([employer] VITAMIN D3) 10000 units CAPS Take by mouth.    [name] ibuprofen (ADVIL,MOTRIN) 600 MG tablet Take 600 mg by mouth every 6 (six) hours as needed.    [name] levonorgestrel (MIRENA) 20 MCG/24HR IUD 1 each by Intrauterine route once.    . Magnesium 250 MG TABS Take 1 tablet by mouth daily.    . montelukast (SINGULAIR) 10 MG tablet Take 10 mg by mouth at bedtime.    . Multiple Vitamins-Minerals (HAIR SKIN & NAILS ADVANCED PO) Take 1 tablet by mouth daily.    . Omega-3 Fatty Acids (OMEGA-3 FISH OIL) 1200 MG CAPS Take 2 capsules by mouth daily.    . Oral Electrolytes (REVITAL JELL CUPS PO) Take 1 capsule by mouth daily.     No current facility-administered medications on file prior to visit.     PAST MEDICAL HISTORY: Past Medical History:  Diagnosis Date  . Allergy   . Asthma   . Breast lump   . Constipation   . Dry skin   . Fatigue   . GERD (gastroesophageal reflux disease)    pregnancy related-zantac  . HTN (hypertension)   . Hyperlipidemia   .  Infertility, female   . Joint pain   . Lactose intolerance   . Missed abortion    6weeks 3days  . Muscle pain   . Obesity   . Palpitations   . PCOS (polycystic ovarian syndrome)   . Seasonal allergies   . Skin rash   . Stress   . Vitamin D deficiency     PAST SURGICAL HISTORY: Past Surgical History:  Procedure Laterality Date  . BREAST BIOPSY    . CESAREAN SECTION    . DILATION AND EVACUATION  05/09/2012   Procedure: DILATATION AND EVACUATION;  Surgeon: Marvene Staff, MD;  Location: [facility];  Service: Gynecology;  Laterality: N/A;  . EYE SURGERY    . LASIK    . [place]    . WISDOM TOOTH EXTRACTION  1994    SOCIAL HISTORY: Social  History   Tobacco Use  . Smoking status: Never Smoker  Substance Use Topics  . Alcohol use: No    Alcohol/week: 0.0 oz  . Drug use: No    FAMILY HISTORY: Family History  Problem Relation Age of Onset  . Hyperlipidemia Mother   . Hyperlipidemia Father   . Hypertension Father   . Anxiety disorder Father   . Hyperlipidemia Maternal Grandmother   . Hypertension Maternal Grandmother   . Heart disease Maternal Grandmother   . Heart disease Maternal Grandfather   . Diabetes Maternal Grandfather   . Stroke Paternal Grandmother   . Mental illness Paternal Grandmother   . Heart disease Paternal Grandmother     ROS: Review of Systems  Constitutional: Positive for weight loss.  Cardiovascular: Negative for chest pain and claudication.  Gastrointestinal: Negative for nausea and vomiting.  Genitourinary: Negative for frequency.  Musculoskeletal: Negative for myalgias.       Negative for muscle weakness  Endo/Heme/Allergies: Negative for polydipsia.       Negative for hypoglycemia    PHYSICAL EXAM: Blood pressure 120/83, pulse 68, temperature 97.9 F (36.6 C), temperature source Oral, height 5\' 5"  (1.651 m), weight 193 lb (87.5 kg), SpO2 100 %. Body mass index is 32.12 kg/m. Physical Exam  Constitutional: She is oriented to person, place, and time. She appears well-developed and well-nourished.  Cardiovascular: Normal rate.  Pulmonary/Chest: Effort normal.  Neurological: She is oriented to person, place, and time.  Skin: Skin is warm and dry.  Psychiatric: She has a normal mood and affect. Her behavior is normal.  Vitals reviewed.   RECENT LABS AND TESTS: BMET    Component Value Date/Time   NA 139 11/27/2017 1203   K 4.3 11/27/2017 1203   CL 99 11/27/2017 1203   CO2 24 11/27/2017 1203   GLUCOSE 84 11/27/2017 1203   GLUCOSE 91 04/06/2015 1715   BUN 11 11/27/2017 1203   CREATININE 0.89 11/27/2017 1203   CALCIUM 9.7 11/27/2017 1203   GFRNONAA 79 11/27/2017 1203    GFRAA 91 11/27/2017 1203   Lab Results  Component Value Date   HGBA1C 5.7 (H) 11/27/2017   Lab Results  Component Value Date   INSULIN 12.2 11/27/2017   CBC    Component Value Date/Time   WBC 8.0 11/27/2017 1203   WBC 10.4 04/06/2015 1715   RBC 4.54 11/27/2017 1203   RBC 4.58 04/06/2015 1715   HGB 13.6 11/27/2017 1203   HCT 38.8 11/27/2017 1203   PLT 343 04/06/2015 1715   MCV 86 11/27/2017 1203   MCH 30.0 11/27/2017 1203   MCH 30.3 04/06/2015 1715   MCHC 35.1  11/27/2017 1203   MCHC 33.7 04/06/2015 1715   RDW 14.5 11/27/2017 1203   LYMPHSABS 2.2 11/27/2017 1203   MONOABS 0.6 04/06/2015 1715   EOSABS 0.2 11/27/2017 1203   BASOSABS 0.0 11/27/2017 1203   Iron/TIBC/Ferritin/ %Sat No results found for: IRON, TIBC, FERRITIN, IRONPCTSAT Lipid Panel     Component Value Date/Time   CHOL 240 (H) 11/27/2017 1203   TRIG 107 11/27/2017 1203   HDL 44 11/27/2017 1203   LDLCALC 175 (H) 11/27/2017 1203   Hepatic Function Panel     Component Value Date/Time   PROT 7.8 11/27/2017 1203   ALBUMIN 4.8 11/27/2017 1203   AST 20 11/27/2017 1203   ALT 20 11/27/2017 1203   ALKPHOS 42 11/27/2017 1203   BILITOT 0.4 11/27/2017 1203      Component Value Date/Time   TSH 0.667 11/27/2017 1203   TSH 0.406  05/26/2009 1040   Results for ZI, SEK (MRN [id number]) as of 12/11/2017 13:38  Ref. Range 11/27/2017 12:03  Vitamin D, 25-Hydroxy Latest Ref Range: 30.0 - 100.0 ng/mL 40.3   ASSESSMENT AND PLAN: Vitamin D deficiency  Prediabetes - Plan: metFORMIN (GLUCOPHAGE) 500 MG tablet  Other hyperlipidemia  At risk for diabetes mellitus  Class 1 obesity with serious comorbidity and body mass index (BMI) of 32.0 to 32.9 in adult, unspecified obesity type  PLAN:  Pre-Diabetes Robin Knapp will continue to work on weight loss, exercise, and decreasing simple carbohydrates in her diet to help decrease the risk of diabetes. We dicussed metformin including benefits and risks. She was  informed that eating too many simple carbohydrates or too many calories at one sitting increases the likelihood of GI side effects. Robin Knapp agreed to start metformin 500 mg by mouth qAM #30 with no refills and follow up with [name] as directed to monitor her progress.  Diabetes risk counseling Robin Knapp was given extended (30 minutes) diabetes prevention counseling today. She is 47 y.o. female and has risk factors for diabetes including obesity and pre-diabetes. We discussed intensive lifestyle modifications today with an emphasis on weight loss as well as increasing exercise and decreasing simple carbohydrates in her diet.  Vitamin D Deficiency Robin Knapp was informed that low vitamin D levels contributes to fatigue and are associated with obesity, breast, and colon cancer. She agrees to continue to take Vit D @10 ,000 IU daily and will follow up for routine testing of vitamin D, at least 2-3 times per year. She was informed of the risk of over-replacement of vitamin D and agrees to not increase her dose unless she discusses this with [name] first.  Hyperlipidemia Robin Knapp was informed of the American Heart Association Guidelines emphasizing intensive lifestyle modifications as the first line treatment for hyperlipidemia. We discussed many lifestyle modifications today in depth, and Robin Knapp will continue to work on decreasing saturated fats such as fatty red meat, butter and many fried foods. She will also increase vegetables and lean protein in her diet and continue to work on exercise and weight loss efforts. We will repeat fasting lipid panel in 3 months.  Obesity Robin Knapp is currently in the action stage of change. As such, her goal is to continue with weight loss efforts She has agreed to follow the Category 2 plan Robin Knapp has been instructed to work up to a goal of 150 minutes of combined cardio and strengthening exercise per week for weight loss and overall health benefits. We discussed the following  Behavioral Modification Strategies today: increase H2O intake, planning for success, increasing lean protein  intake and work on meal planning and easy cooking plans  Robin Knapp has agreed to follow up with our clinic in 2 weeks. She was informed of the importance of frequent follow up visits to maximize her success with intensive lifestyle modifications for her multiple health conditions.   OBESITY BEHAVIORAL INTERVENTION VISIT  Today's visit was # 2 out of 22.  Starting weight: 199 lbs Starting date: 11/27/17 Today's weight : 193 lbs  Today's date: 12/11/2017 Total lbs lost to date: 6 (Patients must lose 7 lbs in the first 6 months to continue with counseling)   ASK: We discussed the diagnosis of obesity with Robin Knapp today and Robin Knapp agreed to give [name] permission to discuss obesity behavioral modification therapy today.  ASSESS: Robin Knapp has the diagnosis of obesity and her BMI today is 32.12 Robin Knapp is in the action stage of change   ADVISE: Robin Knapp was educated on the multiple health risks of obesity as well as the benefit of weight loss to improve her health. She was advised of the need for long term treatment and the importance of lifestyle modifications.  AGREE: Multiple dietary modification options and treatment options were discussed and  Robin Knapp agreed to the above obesity treatment plan.  I, Robin Knapp, am acting as transcriptionist for Eber Jones, MD  I have reviewed the above documentation for accuracy and completeness, and I agree with the above. - Ilene Qua, MD

## 2017-12-31 ENCOUNTER — Ambulatory Visit (INDEPENDENT_AMBULATORY_CARE_PROVIDER_SITE_OTHER): Payer: 59 | Admitting: Family Medicine

## 2017-12-31 VITALS — BP 117/79 | HR 75 | Temp 97.9°F | Ht 65.0 in | Wt 187.0 lb

## 2017-12-31 DIAGNOSIS — Z6831 Body mass index (BMI) 31.0-31.9, adult: Secondary | ICD-10-CM

## 2017-12-31 DIAGNOSIS — Z9189 Other specified personal risk factors, not elsewhere classified: Secondary | ICD-10-CM | POA: Diagnosis not present

## 2017-12-31 DIAGNOSIS — E669 Obesity, unspecified: Secondary | ICD-10-CM | POA: Diagnosis not present

## 2017-12-31 DIAGNOSIS — R7303 Prediabetes: Secondary | ICD-10-CM

## 2017-12-31 MED ORDER — METFORMIN HCL 500 MG PO TABS: 500.0000 mg | ORAL_TABLET | Freq: Every day | ORAL | 0 refills | Status: DC

## 2017-12-31 NOTE — Progress Notes (Signed)
Office: [phone]  /  Fax: [phone]   HPI:   Chief Complaint: OBESITY Robin Knapp is here to discuss her progress with her obesity treatment plan. She is on the Category 2 plan and is following her eating plan approximately 95 % of the time. She states she is doing spin class and walking for 30-60 minutes 3 times per week. [name] continues to do well with weight loss, has had increased temptations at work. She notes hunger is controlled and sometimes it is difficult to eat all of her food. She is also getting bored with breakfast options.  Her weight is 187 lb (84.8 kg) today and has had a weight loss of 6 pounds over a period of 2 to 3 weeks since her last visit. She has lost 12 lbs since starting treatment with [name].  Pre-Diabetes [name] has a diagnosis of pre-diabetes based on her elevated Hgb A1c and was informed this puts her at greater risk of developing diabetes. She started metformin, had mild GI upset but this has resolved after the 1st week or so. She notes decreased polyphagia. She continues to work on diet and exercise to decrease risk of diabetes. She denies nausea or hypoglycemia.  At risk for diabetes [name] is at higher than average risk for developing diabetes due to her obesity and pre-diabetes. She currently denies polyuria or polydipsia.  ALLERGIES: Allergies  Allergen Reactions  . Sulfa Antibiotics Hives    itching  . Sunscreens     MEDICATIONS: Current Outpatient Medications on File Prior to Visit  Medication Sig Dispense Refill  . acetaminophen (TYLENOL 8 HOUR ARTHRITIS PAIN) 650 MG CR tablet Take 650 mg by mouth every 8 (eight) hours as needed for pain.    [name] albuterol (PROVENTIL HFA;VENTOLIN HFA) 108 (90 Base) MCG/ACT inhaler Inhale 2 puffs into the lungs every 6 (six) hours as needed for wheezing or shortness of breath.    . cetirizine (ZYRTEC) 10 MG tablet Take 10 mg by mouth daily.    . Cholecalciferol ([employer]S VITAMIN D3) 10000 units CAPS Take by mouth.     [name] ibuprofen (ADVIL,MOTRIN) 600 MG tablet Take 600 mg by mouth every 6 (six) hours as needed.    [name] levonorgestrel (MIRENA) 20 MCG/24HR IUD 1 each by Intrauterine route once.    . Magnesium 250 MG TABS Take 1 tablet by mouth daily.    . montelukast (SINGULAIR) 10 MG tablet Take 10 mg by mouth at bedtime.    . Multiple Vitamins-Minerals (HAIR SKIN & NAILS ADVANCED PO) Take 1 tablet by mouth daily.    . Omega-3 Fatty Acids (OMEGA-3 FISH OIL) 1200 MG CAPS Take 2 capsules by mouth daily.    . Oral Electrolytes (REVITAL JELL CUPS PO) Take 1 capsule by mouth daily.     No current facility-administered medications on file prior to visit.     PAST MEDICAL HISTORY: Past Medical History:  Diagnosis Date  . Allergy   . Asthma   . Breast lump   . Constipation   . Dry skin   . Fatigue   . GERD (gastroesophageal reflux disease)    pregnancy related-zantac  . HTN (hypertension)   . Hyperlipidemia   . Infertility, female   . Joint pain   . Lactose intolerance   . Missed abortion    6weeks 3days  . Muscle pain   . Obesity   . Palpitations   . PCOS (polycystic ovarian syndrome)   . Seasonal allergies   . Skin rash   .  Stress   . Vitamin D deficiency     PAST SURGICAL HISTORY: Past Surgical History:  Procedure Laterality Date  . BREAST BIOPSY    . CESAREAN SECTION    . DILATION AND EVACUATION  05/09/2012   Procedure: DILATATION AND EVACUATION;  Surgeon: Marvene Staff, MD;  Location: [facility];  Service: Gynecology;  Laterality: N/A;  . EYE SURGERY    . LASIK    . [place]    . WISDOM TOOTH EXTRACTION  1994    SOCIAL HISTORY: Social History   Tobacco Use  . Smoking status: Never Smoker  Substance Use Topics  . Alcohol use: No    Alcohol/week: 0.0 oz  . Drug use: No    FAMILY HISTORY: Family History  Problem Relation Age of Onset  . Hyperlipidemia Mother   . Hyperlipidemia Father   . Hypertension Father   . Anxiety disorder Father   . Hyperlipidemia Maternal Grandmother    . Hypertension Maternal Grandmother   . Heart disease Maternal Grandmother   . Heart disease Maternal Grandfather   . Diabetes Maternal Grandfather   . Stroke Paternal Grandmother   . Mental illness Paternal Grandmother   . Heart disease Paternal Grandmother     ROS: Review of Systems  Constitutional: Positive for weight loss.  Gastrointestinal: Negative for nausea.  Genitourinary: Negative for frequency.  Endo/Heme/Allergies: Negative for polydipsia.       Positive polyphagia Negative hypoglycemia    PHYSICAL EXAM: Blood pressure 117/79, pulse 75, temperature 97.9 F (36.6 C), temperature source Oral, height 5\' 5"  (1.651 m), weight 187 lb (84.8 kg), SpO2 98 %. Body mass index is 31.12 kg/m. Physical Exam  Constitutional: She is oriented to person, place, and time. She appears well-developed and well-nourished.  Cardiovascular: Normal rate.  Pulmonary/Chest: Effort normal.  Musculoskeletal: Normal range of motion.  Neurological: She is oriented to person, place, and time.  Skin: Skin is warm and dry.  Psychiatric: She has a normal mood and affect. Her behavior is normal.  Vitals reviewed.   RECENT LABS AND TESTS: BMET    Component Value Date/Time   NA 139 11/27/2017 1203   K 4.3 11/27/2017 1203   CL 99 11/27/2017 1203   CO2 24 11/27/2017 1203   GLUCOSE 84 11/27/2017 1203   GLUCOSE 91 04/06/2015 1715   BUN 11 11/27/2017 1203   CREATININE 0.89 11/27/2017 1203   CALCIUM 9.7 11/27/2017 1203   GFRNONAA 79 11/27/2017 1203   GFRAA 91 11/27/2017 1203   Lab Results  Component Value Date   HGBA1C 5.7 (H) 11/27/2017   Lab Results  Component Value Date   INSULIN 12.2 11/27/2017   CBC    Component Value Date/Time   WBC 8.0 11/27/2017 1203   WBC 10.4 04/06/2015 1715   RBC 4.54 11/27/2017 1203   RBC 4.58 04/06/2015 1715   HGB 13.6 11/27/2017 1203   HCT 38.8 11/27/2017 1203   PLT 343 04/06/2015 1715   MCV 86 11/27/2017 1203   MCH 30.0 11/27/2017 1203   MCH  30.3 04/06/2015 1715   MCHC 35.1 11/27/2017 1203   MCHC 33.7 04/06/2015 1715   RDW 14.5 11/27/2017 1203   LYMPHSABS 2.2 11/27/2017 1203   MONOABS 0.6 04/06/2015 1715   EOSABS 0.2 11/27/2017 1203   BASOSABS 0.0 11/27/2017 1203   Iron/TIBC/Ferritin/ %Sat No results found for: IRON, TIBC, FERRITIN, IRONPCTSAT Lipid Panel     Component Value Date/Time   CHOL 240 (H) 11/27/2017 1203   TRIG 107 11/27/2017 1203  HDL 44 11/27/2017 1203   LDLCALC 175 (H) 11/27/2017 1203   Hepatic Function Panel     Component Value Date/Time   PROT 7.8 11/27/2017 1203   ALBUMIN 4.8 11/27/2017 1203   AST 20 11/27/2017 1203   ALT 20 11/27/2017 1203   ALKPHOS 42 11/27/2017 1203   BILITOT 0.4 11/27/2017 1203     ASSESSMENT AND PLAN: Prediabetes - Plan: metFORMIN (GLUCOPHAGE) 500 MG tablet  At risk for diabetes mellitus  Class 1 obesity with serious comorbidity and body mass index (BMI) of 31.0 to 31.9 in adult, unspecified obesity type  PLAN:  Pre-Diabetes Ieesha will continue to work on weight loss, exercise, and decreasing simple carbohydrates in her diet to help decrease the risk of diabetes. We dicussed metformin including benefits and risks. She was informed that eating too many simple carbohydrates or too many calories at one sitting increases the likelihood of GI side effects. Cheyeanne agrees to continues taking metformin 500 mg q AM #30 and we will refill for 1 month. Zayda agrees to follow up with our clinic in 2 to 3 weeks as directed to monitor her progress.  Diabetes risk counselling Tiasha was given extended (15 minutes) diabetes prevention counseling today. She is 47 y.o. female and has risk factors for diabetes including obesity and pre-diabetes. We discussed intensive lifestyle modifications today with an emphasis on weight loss as well as increasing exercise and decreasing simple carbohydrates in her diet.  Obesity Allyson is currently in the action stage of change. As such, her  goal is to continue with weight loss efforts She has agreed to keep a food journal with 250-350 calories and 20 grams of protein at breakfast daily and follow the Category 2 plan Lashell has been instructed to work up to a goal of 150 minutes of combined cardio and strengthening exercise per week for weight loss and overall health benefits. We discussed the following Behavioral Modification Strategies today: increasing lean protein intake, work on meal planning and easy cooking plans, and keep a strict food journal   Doninique has agreed to follow up with our clinic in 2 to 3 weeks. She was informed of the importance of frequent follow up visits to maximize her success with intensive lifestyle modifications for her multiple health conditions.   OBESITY BEHAVIORAL INTERVENTION VISIT  Today's visit was # 3 out of 22.  Starting weight: 199 lbs Starting date: 11/27/17 Today's weight : 187 lbs  Today's date: 12/31/2017 Total lbs lost to date: 12 (Patients must lose 7 lbs in the first 6 months to continue with counseling)   ASK: We discussed the diagnosis of obesity with Graceann Congress today and Brier agreed to give [name] permission to discuss obesity behavioral modification therapy today.  ASSESS: Marysue has the diagnosis of obesity and her BMI today is 31.12 Emmelyn is in the action stage of change   ADVISE: Rusti was educated on the multiple health risks of obesity as well as the benefit of weight loss to improve her health. She was advised of the need for long term treatment and the importance of lifestyle modifications.  AGREE: Multiple dietary modification options and treatment options were discussed and  Nicolette agreed to the above obesity treatment plan.  I, Trixie Dredge, am acting as transcriptionist for Dennard Nip, MD  I have reviewed the above documentation for accuracy and completeness, and I agree with the above. -Dennard Nip, MD

## 2018-01-08 MED FILL — metFORMIN HCL 500 MG TABS: 500 | 30 days supply | Qty: 30 | Fill #0

## 2018-01-08 MED FILL — KETOCONAZOLE 2% CREAM: 2 | 14 days supply | Qty: 60 | Fill #0

## 2018-01-15 ENCOUNTER — Ambulatory Visit (INDEPENDENT_AMBULATORY_CARE_PROVIDER_SITE_OTHER): Payer: 59 | Admitting: Family Medicine

## 2018-01-15 VITALS — BP 130/84 | HR 80 | Temp 97.8°F | Ht 65.0 in | Wt 187.0 lb

## 2018-01-15 DIAGNOSIS — Z6831 Body mass index (BMI) 31.0-31.9, adult: Secondary | ICD-10-CM

## 2018-01-15 DIAGNOSIS — E669 Obesity, unspecified: Secondary | ICD-10-CM | POA: Diagnosis not present

## 2018-01-15 DIAGNOSIS — R7303 Prediabetes: Secondary | ICD-10-CM | POA: Diagnosis not present

## 2018-01-15 DIAGNOSIS — E7849 Other hyperlipidemia: Secondary | ICD-10-CM | POA: Diagnosis not present

## 2018-01-16 NOTE — Progress Notes (Signed)
Office: [phone]  /  Fax: [phone]   HPI:   Chief Complaint: OBESITY Robin Knapp is here to discuss her progress with her obesity treatment plan. She is on the keep a food journal with 250-350 calories and 20 grams of protein at breakfast daily and follow the Category 2 plan and is following her eating plan approximately 85-90 % of the time. She states she is exercising 0 minutes 0 times per week. Robin Knapp tried journaling breakfast but not as conducive to journaling. Finds herself getting full mid yogurt.  Her weight is 187 lb (84.8 kg) today and has not lost weight since her last visit. She has lost 12 lbs since starting treatment with Robin Knapp.  Pre-Diabetes Robin Knapp has a diagnosis of pre-diabetes based on her elevated Hgb A1c and was informed this puts her at greater risk of developing diabetes. Hunger is much better controlled. She is taking metformin currently and continues to work on diet and exercise to decrease risk of diabetes. She denies nausea or hypoglycemia.  Hyperlipidemia [name] has hyperlipidemia and has been trying to improve her cholesterol levels with intensive lifestyle modification including a low saturated fat diet, exercise and weight loss. LDL elevated, she is on Omega 3. She denies any chest pain, claudication or myalgias.  ALLERGIES: Allergies  Allergen Reactions  . Sulfa Antibiotics Hives    itching  . Sunscreens     MEDICATIONS: Current Outpatient Medications on File Prior to Visit  Medication Sig Dispense Refill  . acetaminophen (TYLENOL 8 HOUR ARTHRITIS PAIN) 650 MG CR tablet Take 650 mg by mouth every 8 (eight) hours as needed for pain.    [name] albuterol (PROVENTIL HFA;VENTOLIN HFA) 108 (90 Base) MCG/ACT inhaler Inhale 2 puffs into the lungs every 6 (six) hours as needed for wheezing or shortness of breath.    . cetirizine (ZYRTEC) 10 MG tablet Take 10 mg by mouth daily.    . Cholecalciferol ([employer] VITAMIN D3) 10000 units CAPS Take by mouth.    [name] ibuprofen  (ADVIL,MOTRIN) 600 MG tablet Take 600 mg by mouth every 6 (six) hours as needed.    [name] levonorgestrel (MIRENA) 20 MCG/24HR IUD 1 each by Intrauterine route once.    . Magnesium 250 MG TABS Take 1 tablet by mouth daily.    . metFORMIN (GLUCOPHAGE) 500 MG tablet Take 1 tablet (500 mg total) by mouth daily with breakfast. 30 tablet 0  . montelukast (SINGULAIR) 10 MG tablet Take 10 mg by mouth at bedtime.    . Multiple Vitamins-Minerals (HAIR SKIN & NAILS ADVANCED PO) Take 1 tablet by mouth daily.    . Omega-3 Fatty Acids (OMEGA-3 FISH OIL) 1200 MG CAPS Take 2 capsules by mouth daily.    . Oral Electrolytes (REVITAL JELL CUPS PO) Take 1 capsule by mouth daily.     No current facility-administered medications on file prior to visit.     PAST MEDICAL HISTORY: Past Medical History:  Diagnosis Date  . Allergy   . Asthma   . Breast lump   . Constipation   . Dry skin   . Fatigue   . GERD (gastroesophageal reflux disease)    pregnancy related-zantac  . HTN (hypertension)   . Hyperlipidemia   . Infertility, female   . Joint pain   . Lactose intolerance   . Missed abortion    6weeks 3days  . Muscle pain   . Obesity   . Palpitations   . PCOS (polycystic ovarian syndrome)   . Seasonal allergies   .  Skin rash   . Stress   . Vitamin D deficiency     PAST SURGICAL HISTORY: Past Surgical History:  Procedure Laterality Date  . BREAST BIOPSY    . CESAREAN SECTION    . DILATION AND EVACUATION  05/09/2012   Procedure: DILATATION AND EVACUATION;  Surgeon: Marvene Staff, MD;  Location: [facility];  Service: Gynecology;  Laterality: N/A;  . EYE SURGERY    . LASIK    . [place]    . WISDOM TOOTH EXTRACTION  1994    SOCIAL HISTORY: Social History   Tobacco Use  . Smoking status: Never Smoker  Substance Use Topics  . Alcohol use: No    Alcohol/week: 0.0 oz  . Drug use: No    FAMILY HISTORY: Family History  Problem Relation Age of Onset  . Hyperlipidemia Mother   . Hyperlipidemia  Father   . Hypertension Father   . Anxiety disorder Father   . Hyperlipidemia Maternal Grandmother   . Hypertension Maternal Grandmother   . Heart disease Maternal Grandmother   . Heart disease Maternal Grandfather   . Diabetes Maternal Grandfather   . Stroke Paternal Grandmother   . Mental illness Paternal Grandmother   . Heart disease Paternal Grandmother     ROS: Review of Systems  Constitutional: Negative for weight loss.  Cardiovascular: Negative for chest pain and claudication.  Gastrointestinal: Negative for nausea.  Musculoskeletal: Negative for myalgias.  Endo/Heme/Allergies:       Negative hypoglycemia    PHYSICAL EXAM: Blood pressure 130/84, pulse 80, temperature 97.8 F (36.6 C), height 5\' 5"  (1.651 m), weight 187 lb (84.8 kg), SpO2 97 %. Body mass index is 31.12 kg/m. Physical Exam  Constitutional: She is oriented to person, place, and time. She appears well-developed and well-nourished.  Cardiovascular: Normal rate.  Pulmonary/Chest: Effort normal.  Musculoskeletal: Normal range of motion.  Neurological: She is oriented to person, place, and time.  Skin: Skin is warm and dry.  Psychiatric: She has a normal mood and affect. Her behavior is normal.  Vitals reviewed.   RECENT LABS AND TESTS: BMET    Component Value Date/Time   NA 139 11/27/2017 1203   K 4.3 11/27/2017 1203   CL 99 11/27/2017 1203   CO2 24 11/27/2017 1203   GLUCOSE 84 11/27/2017 1203   GLUCOSE 91 04/06/2015 1715   BUN 11 11/27/2017 1203   CREATININE 0.89 11/27/2017 1203   CALCIUM 9.7 11/27/2017 1203   GFRNONAA 79 11/27/2017 1203   GFRAA 91 11/27/2017 1203   Lab Results  Component Value Date   HGBA1C 5.7 (H) 11/27/2017   Lab Results  Component Value Date   INSULIN 12.2 11/27/2017   CBC    Component Value Date/Time   WBC 8.0 11/27/2017 1203   WBC 10.4 04/06/2015 1715   RBC 4.54 11/27/2017 1203   RBC 4.58 04/06/2015 1715   HGB 13.6 11/27/2017 1203   HCT 38.8 11/27/2017  1203   PLT 343 04/06/2015 1715   MCV 86 11/27/2017 1203   MCH 30.0 11/27/2017 1203   MCH 30.3 04/06/2015 1715   MCHC 35.1 11/27/2017 1203   MCHC 33.7 04/06/2015 1715   RDW 14.5 11/27/2017 1203   LYMPHSABS 2.2 11/27/2017 1203   MONOABS 0.6 04/06/2015 1715   EOSABS 0.2 11/27/2017 1203   BASOSABS 0.0 11/27/2017 1203   Iron/TIBC/Ferritin/ %Sat No results found for: IRON, TIBC, FERRITIN, IRONPCTSAT Lipid Panel     Component Value Date/Time   CHOL 240 (H) 11/27/2017 1203  TRIG 107 11/27/2017 1203   HDL 44 11/27/2017 1203   LDLCALC 175 (H) 11/27/2017 1203   Hepatic Function Panel     Component Value Date/Time   PROT 7.8 11/27/2017 1203   ALBUMIN 4.8 11/27/2017 1203   AST 20 11/27/2017 1203   ALT 20 11/27/2017 1203   ALKPHOS 42 11/27/2017 1203   BILITOT 0.4 11/27/2017 1203      Component Value Date/Time   TSH 0.667 11/27/2017 1203   TSH 0.406  05/26/2009 1040    ASSESSMENT AND PLAN: Prediabetes  Other hyperlipidemia  Class 1 obesity with serious comorbidity and body mass index (BMI) of 31.0 to 31.9 in adult, unspecified obesity type  PLAN:  Pre-Diabetes Anjali will continue to work on weight loss, exercise, and decreasing simple carbohydrates in her diet to help decrease the risk of diabetes. We dicussed metformin including benefits and risks. She was informed that eating too many simple carbohydrates or too many calories at one sitting increases the likelihood of GI side effects. Ashlyne agrees to continue taking metformin and she agrees to follow up with our clinic in 2 weeks as directed to monitor her progress.  Hyperlipidemia Delinda was informed of the American Heart Association Guidelines emphasizing intensive lifestyle modifications as the first line treatment for hyperlipidemia. We discussed many lifestyle modifications today in depth, and Hilja will continue to work on decreasing saturated fats such as fatty red meat, butter and many fried foods. She will also  increase vegetables and lean protein in her diet and continue to work on exercise and weight loss efforts. We will recheck labs in 1 month and a half and Monalisa agrees to follow up with our clinic in 2 weeks.  We spent > than 50% of the 15 minute visit on the counseling as documented in the note.  Obesity Jyoti is currently in the action stage of change. As such, her goal is to continue with weight loss efforts She has agreed to keep a food journal with 300-400 calories and 30 grams of protein at lunch daily and follow the Category 2 plan Otha has been instructed to work up to a goal of 150 minutes of combined cardio and strengthening exercise per week for weight loss and overall health benefits. We discussed the following Behavioral Modification Strategies today: increasing lean protein intake, increasing vegetables, work on meal planning and easy cooking plans, and planning for success   Elayah has agreed to follow up with our clinic in 2 weeks. She was informed of the importance of frequent follow up visits to maximize her success with intensive lifestyle modifications for her multiple health conditions.   OBESITY BEHAVIORAL INTERVENTION VISIT  Today's visit was # 4 out of 22.  Starting weight: 199 lbs Starting date: 11/27/17 Today's weight : 187 lbs Today's date: 01/15/2018 Total lbs lost to date: 12 (Patients must lose 7 lbs in the first 6 months to continue with counseling)   ASK: We discussed the diagnosis of obesity with Graceann Congress today and Chrysten agreed to give Robin Knapp permission to discuss obesity behavioral modification therapy today.  ASSESS: Presley has the diagnosis of obesity and her BMI today is 31.12 Sally is in the action stage of change   ADVISE: Sharmin was educated on the multiple health risks of obesity as well as the benefit of weight loss to improve her health. She was advised of the need for long term treatment and the importance of lifestyle  modifications.  AGREE: Multiple dietary modification options and  treatment options were discussed and  Messina agreed to the above obesity treatment plan.  I, Trixie Dredge, am acting as transcriptionist for Ilene Qua, MD  I have reviewed the above documentation for accuracy and completeness, and I agree with the above. - Ilene Qua, MD

## 2018-01-17 ENCOUNTER — Ambulatory Visit (INDEPENDENT_AMBULATORY_CARE_PROVIDER_SITE_OTHER): Payer: 59 | Admitting: Neurology

## 2018-01-17 ENCOUNTER — Encounter (INDEPENDENT_AMBULATORY_CARE_PROVIDER_SITE_OTHER): Payer: 59 | Admitting: Neurology

## 2018-01-17 ENCOUNTER — Other Ambulatory Visit: Payer: Self-pay | Admitting: *Deleted

## 2018-01-17 DIAGNOSIS — Z0289 Encounter for other administrative examinations: Secondary | ICD-10-CM

## 2018-01-17 DIAGNOSIS — G5603 Carpal tunnel syndrome, bilateral upper limbs: Secondary | ICD-10-CM

## 2018-01-17 MED ORDER — GABAPENTIN 100 MG PO CAPS: 300.0000 mg | ORAL_CAPSULE | Freq: Every day | ORAL | 11 refills | Status: DC

## 2018-01-17 MED FILL — GABAPENTIN 100 MG CAP: 100 | 30 days supply | Qty: 90 | Fill #0

## 2018-01-17 NOTE — Procedures (Signed)
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  On examination: Bilateral upper extremity motor strength is normal, bilateral abductor pollicis brevis, and opponens.  Bilateral wrist Tinel signs were present.    Summary of the tests: Nerve conduction study: Bilateral median sensory responses showed mildly prolonged peak latency amplitude, with normal snap amplitude.  Bilateral ulnar sensory and motor responses, left median motor responses were normal.  Electromyography: Selective needle examination of right upper extremity muscles were normal,   with the exception of mildly decreased recruitment pattern in the right abductor pollicis brevis, there is no evidence of active denervation.  Conclusion:  This is an abnormal study.  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  There is no evidence of axonal loss.   ------------------------------- Marcial Pacas, M.D.  [facility] [place], [place] [address] Tel: [phone] Fax: [phone]        [facility]    Nerve / Sites Muscle Latency Ref. Amplitude Ref. Rel Amp Segments Distance Velocity Ref. Area    ms ms mV mV %  cm m/s m/s mVms  L Median - APB     Wrist APB 4.2 ?4.4 7.5 ?4.0 100 Wrist - APB 7   33.8     Upper arm APB 8.3  7.3  97.9 Upper arm - Wrist 22 53 ?49 35.1  R Median - APB     Wrist APB 4.2 ?4.4 8.2 ?4.0 100 Wrist - APB 7   34.0     Upper arm APB 8.3  8.1  99.8 Upper arm - Wrist 22 54 ?49 32.2  L Ulnar - ADM     Wrist ADM 2.6 ?3.3 6.6 ?6.0  100 Wrist - ADM 7   23.1     B.Elbow ADM 5.5  6.2  93.2 B.Elbow - Wrist 17 57 ?49 22.5     A.Elbow ADM 7.2  5.6  90.9 A.Elbow - B.Elbow 10 58 ?49 20.7         A.Elbow - Wrist      R Ulnar - ADM     Wrist ADM 2.0 ?3.3 8.7 ?6.0 100 Wrist - ADM 7   30.0     B.Elbow ADM 5.4  8.7  99.5 B.Elbow - Wrist 17 51 ?49 30.2     A.Elbow ADM 7.2  8.5  98.4 A.Elbow - B.Elbow 10 55 ?49 28.7         A.Elbow - Wrist                 SNC    Nerve / Sites Rec. Site Peak Lat Ref.  Amp Ref. Segments Distance Peak Diff Ref.    ms ms V V  cm ms ms  L Median, Ulnar - Transcarpal comparison     Median Palm Wrist 3.1 ?2.2 30 ?35 Median Palm - Wrist 8       Ulnar Palm Wrist 2.1 ?2.2 16 ?12 Ulnar Palm - Wrist 8          Median Palm - Ulnar Palm  1.0 ?0.4  R  Median, Ulnar - Transcarpal comparison     Median Palm Wrist 3.0 ?2.2 45 ?35 Median Palm - Wrist 8       Ulnar Palm Wrist 2.1 ?2.2 29 ?12 Ulnar Palm - Wrist 8          Median Palm - Ulnar Palm  0.8 ?0.4  L Median - Orthodromic (Dig II, Mid palm)     Dig II Wrist 3.6 ?3.4 13 ?10 Dig II - Wrist 13    R Median - Orthodromic (Dig II, Mid palm)     Dig II Wrist 3.9 ?3.4 22 ?10 Dig II - Wrist 13    L Ulnar - Orthodromic, (Dig V, Mid palm)     Dig V Wrist 2.6 ?3.1 10 ?5 Dig V - Wrist 11    R Ulnar - Orthodromic, (Dig V, Mid palm)     Dig V Wrist 2.6 ?3.1 20 ?5 Dig V - Wrist 36                   F  Wave    Nerve F Lat Ref.   ms ms  L Ulnar - ADM 26.8 ?32.0  R Ulnar - ADM 26.1 ?32.0         EMG full       EMG Summary Table    Spontaneous MUAP Recruitment  Muscle IA Fib PSW Fasc Other Amp Dur. Poly Pattern  R. First dorsal interosseous Normal None None None _______ Normal Normal Normal Normal  R. Pronator teres Normal None None None _______ Normal Normal Normal Normal  R. Deltoid Normal None None None _______ Normal Normal Normal Normal  R. Biceps brachii Normal None None None _______ Normal Normal Normal Normal  R. Extensor digitorum communis Normal None  None None _______ Normal Normal Normal Normal  R. Abductor pollicis brevis Normal None None None _______ Normal Normal Normal Reduced

## 2018-01-30 ENCOUNTER — Ambulatory Visit (INDEPENDENT_AMBULATORY_CARE_PROVIDER_SITE_OTHER): Payer: 59 | Admitting: Family Medicine

## 2018-01-30 VITALS — BP 114/74 | HR 75 | Temp 97.6°F | Ht 65.0 in | Wt 185.0 lb

## 2018-01-30 DIAGNOSIS — Z6830 Body mass index (BMI) 30.0-30.9, adult: Secondary | ICD-10-CM | POA: Diagnosis not present

## 2018-01-30 DIAGNOSIS — R7303 Prediabetes: Secondary | ICD-10-CM

## 2018-01-30 DIAGNOSIS — Z9189 Other specified personal risk factors, not elsewhere classified: Secondary | ICD-10-CM | POA: Diagnosis not present

## 2018-01-30 DIAGNOSIS — E669 Obesity, unspecified: Secondary | ICD-10-CM | POA: Diagnosis not present

## 2018-01-30 DIAGNOSIS — E7849 Other hyperlipidemia: Secondary | ICD-10-CM

## 2018-01-30 DIAGNOSIS — E66811 Obesity, class 1: Secondary | ICD-10-CM

## 2018-01-30 MED ORDER — METFORMIN HCL 500 MG PO TABS: 500.0000 mg | ORAL_TABLET | Freq: Every day | ORAL | 0 refills | Status: DC

## 2018-01-30 NOTE — Progress Notes (Signed)
Office: [phone]  /  Fax: [phone]   HPI:   Chief Complaint: OBESITY Robin Knapp is here to discuss her progress with her obesity treatment plan. She is on the keep a food journal with 300-400 calories and 30 grams of protein at lunch daily and follow the Category 2 plan and is following her eating plan approximately 80 % of the time. She states she is exercising 0 minutes 0 times per week. Robin Knapp is trying to keep herself honest by journaling lunch consistently.  Her weight is 185 lb (83.9 kg) today and has had a weight loss of 2 pounds over a period of 2 weeks since her last visit. She has lost 14 lbs since starting treatment with [name].  Pre-Diabetes Robin Knapp has a diagnosis of pre-diabetes based on her elevated Hgb A1c and was informed this puts her at greater risk of developing diabetes. She is taking metformin currently and continues to work on diet and exercise to decrease risk of diabetes. She notes occasional carbohydrate cravings and denies nausea or hypoglycemic events.  At risk for diabetes Robin Knapp is at higher than average risk for developing diabetes due to her obesity and pre-diabetes. She currently denies polyuria or polydipsia.  Hyperlipidemia Robin Knapp has hyperlipidemia and has been trying to improve her cholesterol levels with intensive lifestyle modification including a low saturated fat diet, exercise and weight loss. Repeat FLP in mid to late June, adding resistance training. She denies any chest pain, claudication or myalgias.  ALLERGIES: Allergies  Allergen Reactions  . Sulfa Antibiotics Hives    itching  . Sunscreens     MEDICATIONS: Current Outpatient Medications on File Prior to Visit  Medication Sig Dispense Refill  . acetaminophen (TYLENOL 8 HOUR ARTHRITIS PAIN) 650 MG CR tablet Take 650 mg by mouth every 8 (eight) hours as needed for pain.    [name] albuterol (PROVENTIL HFA;VENTOLIN HFA) 108 (90 Base) MCG/ACT inhaler Inhale 2 puffs into the lungs every 6  (six) hours as needed for wheezing or shortness of breath.    . cetirizine (ZYRTEC) 10 MG tablet Take 10 mg by mouth daily.    . Cholecalciferol ([employer] VITAMIN D3) 10000 units CAPS Take by mouth.    . gabapentin (NEURONTIN) 100 MG capsule Take 3 capsules (300 mg total) by mouth at bedtime. 90 capsule 11  . ibuprofen (ADVIL,MOTRIN) 600 MG tablet Take 600 mg by mouth every 6 (six) hours as needed.    [name] levonorgestrel (MIRENA) 20 MCG/24HR IUD 1 each by Intrauterine route once.    . Magnesium 250 MG TABS Take 1 tablet by mouth daily.    . montelukast (SINGULAIR) 10 MG tablet Take 10 mg by mouth at bedtime.    . Multiple Vitamins-Minerals (HAIR SKIN & NAILS ADVANCED PO) Take 1 tablet by mouth daily.    . Omega-3 Fatty Acids (OMEGA-3 FISH OIL) 1200 MG CAPS Take 2 capsules by mouth daily.    . Oral Electrolytes (REVITAL JELL CUPS PO) Take 1 capsule by mouth daily.     No current facility-administered medications on file prior to visit.     PAST MEDICAL HISTORY: Past Medical History:  Diagnosis Date  . Allergy   . Asthma   . Breast lump   . Constipation   . Dry skin   . Fatigue   . GERD (gastroesophageal reflux disease)    pregnancy related-zantac  . HTN (hypertension)   . Hyperlipidemia   . Infertility, female   . Joint pain   . Lactose intolerance   .  Missed abortion    6weeks 3days  . Muscle pain   . Obesity   . Palpitations   . PCOS (polycystic ovarian syndrome)   . Seasonal allergies   . Skin rash   . Stress   . Vitamin D deficiency     PAST SURGICAL HISTORY: Past Surgical History:  Procedure Laterality Date  . BREAST BIOPSY    . CESAREAN SECTION    . DILATION AND EVACUATION  05/09/2012   Procedure: DILATATION AND EVACUATION;  Surgeon: Marvene Staff, MD;  Location: [facility];  Service: Gynecology;  Laterality: N/A;  . EYE SURGERY    . LASIK    . [place]    . WISDOM TOOTH EXTRACTION  1994    SOCIAL HISTORY: Social History   Tobacco Use  . Smoking status: Never  Smoker  Substance Use Topics  . Alcohol use: No    Alcohol/week: 0.0 oz  . Drug use: No    FAMILY HISTORY: Family History  Problem Relation Age of Onset  . Hyperlipidemia Mother   . Hyperlipidemia Father   . Hypertension Father   . Anxiety disorder Father   . Hyperlipidemia Maternal Grandmother   . Hypertension Maternal Grandmother   . Heart disease Maternal Grandmother   . Heart disease Maternal Grandfather   . Diabetes Maternal Grandfather   . Stroke Paternal Grandmother   . Mental illness Paternal Grandmother   . Heart disease Paternal Grandmother     ROS: Review of Systems  Constitutional: Positive for weight loss.  Cardiovascular: Negative for chest pain and claudication.  Gastrointestinal: Negative for nausea.  Genitourinary: Negative for frequency.  Musculoskeletal: Negative for myalgias.  Endo/Heme/Allergies: Negative for polydipsia.       Negative hypoglycemia    PHYSICAL EXAM: Blood pressure 114/74, pulse 75, temperature 97.6 F (36.4 C), temperature source Oral, height 5\' 5"  (1.651 m), weight 185 lb (83.9 kg), SpO2 97 %. Body mass index is 30.79 kg/m. Physical Exam  Constitutional: She is oriented to person, place, and time. She appears well-developed and well-nourished.  Cardiovascular: Normal rate.  Pulmonary/Chest: Effort normal.  Musculoskeletal: Normal range of motion.  Neurological: She is oriented to person, place, and time.  Skin: Skin is warm and dry.  Psychiatric: She has a normal mood and affect. Her behavior is normal.  Vitals reviewed.   RECENT LABS AND TESTS: BMET    Component Value Date/Time   NA 139 11/27/2017 1203   K 4.3 11/27/2017 1203   CL 99 11/27/2017 1203   CO2 24 11/27/2017 1203   GLUCOSE 84 11/27/2017 1203   GLUCOSE 91 04/06/2015 1715   BUN 11 11/27/2017 1203   CREATININE 0.89 11/27/2017 1203   CALCIUM 9.7 11/27/2017 1203   GFRNONAA 79 11/27/2017 1203   GFRAA 91 11/27/2017 1203   Lab Results  Component Value Date     HGBA1C 5.7 (H) 11/27/2017   Lab Results  Component Value Date   INSULIN 12.2 11/27/2017   CBC    Component Value Date/Time   WBC 8.0 11/27/2017 1203   WBC 10.4 04/06/2015 1715   RBC 4.54 11/27/2017 1203   RBC 4.58 04/06/2015 1715   HGB 13.6 11/27/2017 1203   HCT 38.8 11/27/2017 1203   PLT 343 04/06/2015 1715   MCV 86 11/27/2017 1203   MCH 30.0 11/27/2017 1203   MCH 30.3 04/06/2015 1715   MCHC 35.1 11/27/2017 1203   MCHC 33.7 04/06/2015 1715   RDW 14.5 11/27/2017 1203   LYMPHSABS 2.2 11/27/2017 1203  MONOABS 0.6 04/06/2015 1715   EOSABS 0.2 11/27/2017 1203   BASOSABS 0.0 11/27/2017 1203   Iron/TIBC/Ferritin/ %Sat No results found for: IRON, TIBC, FERRITIN, IRONPCTSAT Lipid Panel     Component Value Date/Time   CHOL 240 (H) 11/27/2017 1203   TRIG 107 11/27/2017 1203   HDL 44 11/27/2017 1203   LDLCALC 175 (H) 11/27/2017 1203   Hepatic Function Panel     Component Value Date/Time   PROT 7.8 11/27/2017 1203   ALBUMIN 4.8 11/27/2017 1203   AST 20 11/27/2017 1203   ALT 20 11/27/2017 1203   ALKPHOS 42 11/27/2017 1203   BILITOT 0.4 11/27/2017 1203      Component Value Date/Time   TSH 0.667 11/27/2017 1203   TSH 0.406  05/26/2009 1040    ASSESSMENT AND PLAN: Prediabetes - Plan: metFORMIN (GLUCOPHAGE) 500 MG tablet  Other hyperlipidemia  At risk for diabetes mellitus  Class 1 obesity with serious comorbidity and body mass index (BMI) of 30.0 to 30.9 in adult, unspecified obesity type  PLAN:  Pre-Diabetes Robin Knapp will continue to work on weight loss, exercise, and decreasing simple carbohydrates in her diet to help decrease the risk of diabetes. We dicussed metformin including benefits and risks. She was informed that eating too many simple carbohydrates or too many calories at one sitting increases the likelihood of GI side effects. Robin Knapp agrees to continue taking metformin 500 mg PO q AM #30 and we will refill for 1 month. Robin Knapp agrees to follow up with  our clinic in 2 weeks as directed to monitor her progress.  Diabetes risk counselling Robin Knapp was given extended (15 minutes) diabetes prevention counseling today. She is 47 y.o. female and has risk factors for diabetes including obesity and pre-diabetes. We discussed intensive lifestyle modifications today with an emphasis on weight loss as well as increasing exercise and decreasing simple carbohydrates in her diet.  Hyperlipidemia Robin Knapp was informed of the American Heart Association Guidelines emphasizing intensive lifestyle modifications as the first line treatment for hyperlipidemia. We discussed many lifestyle modifications today in depth, and Robin Knapp will continue to work on decreasing saturated fats such as fatty red meat, butter and many fried foods. She will continue Category 2 with journaling for lunch and she will also increase vegetables and lean protein in her diet and continue to work on exercise and weight loss efforts. Robin Knapp agrees to follow up with our clinic in 2 weeks.  Obesity Robin Knapp is currently in the action stage of change. As such, her goal is to continue with weight loss efforts She has agreed to keep a food journal with 300-400 calories and 30 grams of protein at lunch daily and follow the Category 2 plan Robin Knapp has been instructed to work up to a goal of 150 minutes of combined cardio and strengthening exercise per week or resistance training 5-10 minutes 2 time per week for weight loss and overall health benefits. We discussed the following Behavioral Modification Strategies today: increasing lean protein intake, increasing vegetables, work on meal planning and easy cooking plans, better snacking choices, and planning for success   Robin Knapp has agreed to follow up with our clinic in 2 weeks. She was informed of the importance of frequent follow up visits to maximize her success with intensive lifestyle modifications for her multiple health conditions.   OBESITY  BEHAVIORAL INTERVENTION VISIT  Today's visit was # 5 out of 22.  Starting weight: 199 lbs Starting date: 11/27/17 Today's weight : 185 lbs  Today's date: 01/30/2018 Total  lbs lost to date: 14 (Patients must lose 7 lbs in the first 6 months to continue with counseling)   ASK: We discussed the diagnosis of obesity with Robin Knapp Octaviano Glow today and Robin Knapp agreed to give [name] permission to discuss obesity behavioral modification therapy today.  ASSESS: Robin Knapp has the diagnosis of obesity and her BMI today is 30.79 Robin Knapp is in the action stage of change   ADVISE: Robin Knapp was educated on the multiple health risks of obesity as well as the benefit of weight loss to improve her health. She was advised of the need for long term treatment and the importance of lifestyle modifications.  AGREE: Multiple dietary modification options and treatment options were discussed and  Robin Knapp agreed to the above obesity treatment plan.  I, Trixie Dredge, am acting as transcriptionist for Ilene Qua, MD  I have reviewed the above documentation for accuracy and completeness, and I agree with the above. - Ilene Qua, MD

## 2018-02-04 MED FILL — metFORMIN HCL 500 MG TABS: 500 | 30 days supply | Qty: 30 | Fill #0

## 2018-02-25 ENCOUNTER — Ambulatory Visit (INDEPENDENT_AMBULATORY_CARE_PROVIDER_SITE_OTHER): Payer: 59 | Admitting: Family Medicine

## 2018-02-25 VITALS — BP 109/73 | HR 62 | Temp 97.7°F | Ht 65.0 in | Wt 181.0 lb

## 2018-02-25 DIAGNOSIS — E7849 Other hyperlipidemia: Secondary | ICD-10-CM

## 2018-02-25 DIAGNOSIS — E669 Obesity, unspecified: Secondary | ICD-10-CM | POA: Diagnosis not present

## 2018-02-25 DIAGNOSIS — R7303 Prediabetes: Secondary | ICD-10-CM

## 2018-02-25 DIAGNOSIS — Z6830 Body mass index (BMI) 30.0-30.9, adult: Secondary | ICD-10-CM

## 2018-02-25 DIAGNOSIS — Z9189 Other specified personal risk factors, not elsewhere classified: Secondary | ICD-10-CM

## 2018-02-25 MED ORDER — METFORMIN HCL 500 MG PO TABS: 500.0000 mg | ORAL_TABLET | Freq: Every day | ORAL | 0 refills | Status: DC

## 2018-02-25 NOTE — Progress Notes (Signed)
Office: [phone]  /  Fax: [phone]   HPI:   Chief Complaint: OBESITY Robin Knapp is here to discuss her progress with her obesity treatment plan. She is on the keep a food journal with 300-400 calories and 30 grams of protein at lunch daily and follow the Category 2 plan and is following her eating plan approximately 70 % of the time. She states she is walking and using exercise bands for 20-30 minutes 2-3 times per week. Robin Knapp is still enjoying meal plan and planning ahead is helping. She is being creative with meal options.  Her weight is 181 lb (82.1 kg) today and has had a weight loss of 4 pounds over a period of 4 weeks since her last visit. She has lost 18 lbs since starting treatment with [name].  Pre-Diabetes Robin Knapp has a diagnosis of pre-diabetes based on her elevated Hgb A1c and was informed this puts her at greater risk of developing diabetes. She denies GI upset with metformin, no carbohydrate cravings and continues to work on diet and exercise to decrease risk of diabetes. She denies nausea or hypoglycemia.  At risk for diabetes Robin Knapp is at higher than average risk for developing diabetes due to her obesity and pre-diabetes. She currently denies polyuria or polydipsia.  Hyperlipidemia Robin Knapp has hyperlipidemia and has been trying to improve her cholesterol levels with intensive lifestyle modification including a low saturated fat diet, exercise and weight loss. She is on Category 2. She denies any chest pain, claudication or myalgias.  ALLERGIES: Allergies  Allergen Reactions  . Sulfa Antibiotics Hives    itching  . Sunscreens     MEDICATIONS: Current Outpatient Medications on File Prior to Visit  Medication Sig Dispense Refill  . acetaminophen (TYLENOL 8 HOUR ARTHRITIS PAIN) 650 MG CR tablet Take 650 mg by mouth every 8 (eight) hours as needed for pain.    [name] albuterol (PROVENTIL HFA;VENTOLIN HFA) 108 (90 Base) MCG/ACT inhaler Inhale 2 puffs into the lungs every  6 (six) hours as needed for wheezing or shortness of breath.    . cetirizine (ZYRTEC) 10 MG tablet Take 10 mg by mouth daily.    . Cholecalciferol ([employer]S VITAMIN D3) 10000 units CAPS Take by mouth.    . gabapentin (NEURONTIN) 100 MG capsule Take 3 capsules (300 mg total) by mouth at bedtime. 90 capsule 11  . ibuprofen (ADVIL,MOTRIN) 600 MG tablet Take 600 mg by mouth every 6 (six) hours as needed.    [name] levonorgestrel (MIRENA) 20 MCG/24HR IUD 1 each by Intrauterine route once.    . Magnesium 250 MG TABS Take 1 tablet by mouth daily.    . metFORMIN (GLUCOPHAGE) 500 MG tablet Take 1 tablet (500 mg total) by mouth daily with breakfast. 30 tablet 0  . montelukast (SINGULAIR) 10 MG tablet Take 10 mg by mouth at bedtime.    . Multiple Vitamins-Minerals (HAIR SKIN & NAILS ADVANCED PO) Take 1 tablet by mouth daily.    . Omega-3 Fatty Acids (OMEGA-3 FISH OIL) 1200 MG CAPS Take 2 capsules by mouth daily.    . Oral Electrolytes (REVITAL JELL CUPS PO) Take 1 capsule by mouth daily.     No current facility-administered medications on file prior to visit.     PAST MEDICAL HISTORY: Past Medical History:  Diagnosis Date  . Allergy   . Asthma   . Breast lump   . Constipation   . Dry skin   . Fatigue   . GERD (gastroesophageal reflux disease)  pregnancy related-zantac  . HTN (hypertension)   . Hyperlipidemia   . Infertility, female   . Joint pain   . Lactose intolerance   . Missed abortion    6weeks 3days  . Muscle pain   . Obesity   . Palpitations   . PCOS (polycystic ovarian syndrome)   . Seasonal allergies   . Skin rash   . Stress   . Vitamin D deficiency     PAST SURGICAL HISTORY: Past Surgical History:  Procedure Laterality Date  . BREAST BIOPSY    . CESAREAN SECTION    . DILATION AND EVACUATION  05/09/2012   Procedure: DILATATION AND EVACUATION;  Surgeon: Marvene Staff, MD;  Location: [facility];  Service: Gynecology;  Laterality: N/A;  . EYE SURGERY    . LASIK    . [place]      . WISDOM TOOTH EXTRACTION  1994    SOCIAL HISTORY: Social History   Tobacco Use  . Smoking status: Never Smoker  Substance Use Topics  . Alcohol use: No    Alcohol/week: 0.0 oz  . Drug use: No    FAMILY HISTORY: Family History  Problem Relation Age of Onset  . Hyperlipidemia Mother   . Hyperlipidemia Father   . Hypertension Father   . Anxiety disorder Father   . Hyperlipidemia Maternal Grandmother   . Hypertension Maternal Grandmother   . Heart disease Maternal Grandmother   . Heart disease Maternal Grandfather   . Diabetes Maternal Grandfather   . Stroke Paternal Grandmother   . Mental illness Paternal Grandmother   . Heart disease Paternal Grandmother     ROS: Review of Systems  Constitutional: Positive for weight loss.  Cardiovascular: Negative for chest pain and claudication.  Gastrointestinal: Negative for nausea.  Genitourinary: Negative for frequency.  Musculoskeletal: Negative for myalgias.  Endo/Heme/Allergies: Negative for polydipsia.       Negative hypoglycemia    PHYSICAL EXAM: Blood pressure 109/73, pulse 62, temperature 97.7 F (36.5 C), temperature source Oral, height 5\' 5"  (1.651 m), weight 181 lb (82.1 kg), SpO2 100 %. Body mass index is 30.12 kg/m. Physical Exam  Constitutional: She is oriented to person, place, and time. She appears well-developed and well-nourished.  Cardiovascular: Normal rate.  Pulmonary/Chest: Effort normal.  Musculoskeletal: Normal range of motion.  Neurological: She is oriented to person, place, and time.  Skin: Skin is warm and dry.  Psychiatric: She has a normal mood and affect. Her behavior is normal.  Vitals reviewed.   RECENT LABS AND TESTS: BMET    Component Value Date/Time   NA 139 11/27/2017 1203   K 4.3 11/27/2017 1203   CL 99 11/27/2017 1203   CO2 24 11/27/2017 1203   GLUCOSE 84 11/27/2017 1203   GLUCOSE 91 04/06/2015 1715   BUN 11 11/27/2017 1203   CREATININE 0.89 11/27/2017 1203   CALCIUM 9.7  11/27/2017 1203   GFRNONAA 79 11/27/2017 1203   GFRAA 91 11/27/2017 1203   Lab Results  Component Value Date   HGBA1C 5.7 (H) 11/27/2017   Lab Results  Component Value Date   INSULIN 12.2 11/27/2017   CBC    Component Value Date/Time   WBC 8.0 11/27/2017 1203   WBC 10.4 04/06/2015 1715   RBC 4.54 11/27/2017 1203   RBC 4.58 04/06/2015 1715   HGB 13.6 11/27/2017 1203   HCT 38.8 11/27/2017 1203   PLT 343 04/06/2015 1715   MCV 86 11/27/2017 1203   MCH 30.0 11/27/2017 1203   MCH  30.3 04/06/2015 1715   MCHC 35.1 11/27/2017 1203   MCHC 33.7 04/06/2015 1715   RDW 14.5 11/27/2017 1203   LYMPHSABS 2.2 11/27/2017 1203   MONOABS 0.6 04/06/2015 1715   EOSABS 0.2 11/27/2017 1203   BASOSABS 0.0 11/27/2017 1203   Iron/TIBC/Ferritin/ %Sat No results found for: IRON, TIBC, FERRITIN, IRONPCTSAT Lipid Panel     Component Value Date/Time   CHOL 240 (H) 11/27/2017 1203   TRIG 107 11/27/2017 1203   HDL 44 11/27/2017 1203   LDLCALC 175 (H) 11/27/2017 1203   Hepatic Function Panel     Component Value Date/Time   PROT 7.8 11/27/2017 1203   ALBUMIN 4.8 11/27/2017 1203   AST 20 11/27/2017 1203   ALT 20 11/27/2017 1203   ALKPHOS 42 11/27/2017 1203   BILITOT 0.4 11/27/2017 1203      Component Value Date/Time   TSH 0.667 11/27/2017 1203   TSH 0.406  05/26/2009 1040    ASSESSMENT AND PLAN: Prediabetes - Plan: metFORMIN (GLUCOPHAGE) 500 MG tablet  Other hyperlipidemia  At risk for diabetes mellitus  Class 1 obesity with serious comorbidity and body mass index (BMI) of 30.0 to 30.9 in adult, unspecified obesity type  PLAN:  Pre-Diabetes Mirabella will continue to work on weight loss, exercise, and decreasing simple carbohydrates in her diet to help decrease the risk of diabetes. We dicussed metformin including benefits and risks. She was informed that eating too many simple carbohydrates or too many calories at one sitting increases the likelihood of GI side effects. Patriciann agrees  to continue taking metformin 500 mg PO q AM #30 and we will refill for 1 month. We will repeat labs at next visit and Gearline agrees to follow up with our clinic in 2 weeks as directed to monitor her progress.  Diabetes risk counselling Shanavia was given extended (15 minutes) diabetes prevention counseling today. She is 47 y.o. female and has risk factors for diabetes including obesity and pre-diabetes. We discussed intensive lifestyle modifications today with an emphasis on weight loss as well as increasing exercise and decreasing simple carbohydrates in her diet.  Hyperlipidemia Robin Knapp was informed of the American Heart Association Guidelines emphasizing intensive lifestyle modifications as the first line treatment for hyperlipidemia. We discussed many lifestyle modifications today in depth, and Maleeya will continue to work on decreasing saturated fats such as fatty red meat, butter and many fried foods. She will also increase vegetables and lean protein in her diet and continue to work on exercise and weight loss efforts. We will repeat labs at next visit and Mackenzy agrees to follow up with our clinic in 2 weeks.  Obesity Robin Knapp is currently in the action stage of change. As such, her goal is to continue with weight loss efforts She has agreed to follow the Category 2 plan Terica has been instructed to work up to a goal of 150 minutes of combined cardio and strengthening exercise per week or increase resistance training to 30 minutes 3 times per week for weight loss and overall health benefits. We discussed the following Behavioral Modification Strategies today: increasing lean protein intake, increasing vegetables, work on meal planning and easy cooking plans, and planning for success   Robin Knapp has agreed to follow up with our clinic in 2 weeks. She was informed of the importance of frequent follow up visits to maximize her success with intensive lifestyle modifications for her multiple  health conditions.   OBESITY BEHAVIORAL INTERVENTION VISIT  Today's visit was # 6 out of 22.  Starting weight: 199 lbs Starting date: 11/27/17 Today's weight : 181 lbs  Today's date: 02/25/2018 Total lbs lost to date: 45 (Patients must lose 7 lbs in the first 6 months to continue with counseling)   ASK: We discussed the diagnosis of obesity with Robin Knapp today and Robin Knapp agreed to give [name] permission to discuss obesity behavioral modification therapy today.  ASSESS: Robin Knapp has the diagnosis of obesity and her BMI today is 30.12 Robin Knapp is in the action stage of change   ADVISE: Robin Knapp was educated on the multiple health risks of obesity as well as the benefit of weight loss to improve her health. She was advised of the need for long term treatment and the importance of lifestyle modifications.  AGREE: Multiple dietary modification options and treatment options were discussed and  Robin Knapp agreed to the above obesity treatment plan.  I, Trixie Dredge, am acting as transcriptionist for Ilene Qua, MD  I have reviewed the above documentation for accuracy and completeness, and I agree with the above. - Ilene Qua, MD

## 2018-02-26 ENCOUNTER — Encounter (INDEPENDENT_AMBULATORY_CARE_PROVIDER_SITE_OTHER): Payer: Self-pay | Admitting: Family Medicine

## 2018-02-28 MED FILL — metFORMIN HCL 500 MG TABS: 500 | 30 days supply | Qty: 30 | Fill #0

## 2018-03-06 MED FILL — NITROFURANTOIN MONO-MCR 100: 100 | 5 days supply | Qty: 10 | Fill #0

## 2018-03-12 ENCOUNTER — Ambulatory Visit (INDEPENDENT_AMBULATORY_CARE_PROVIDER_SITE_OTHER): Payer: 59 | Admitting: Family Medicine

## 2018-03-12 VITALS — BP 117/80 | HR 63 | Temp 97.8°F | Ht 65.0 in | Wt 178.0 lb

## 2018-03-12 DIAGNOSIS — Z6830 Body mass index (BMI) 30.0-30.9, adult: Secondary | ICD-10-CM | POA: Diagnosis not present

## 2018-03-12 DIAGNOSIS — Z9189 Other specified personal risk factors, not elsewhere classified: Secondary | ICD-10-CM

## 2018-03-12 DIAGNOSIS — E669 Obesity, unspecified: Secondary | ICD-10-CM | POA: Diagnosis not present

## 2018-03-12 DIAGNOSIS — R7303 Prediabetes: Secondary | ICD-10-CM | POA: Diagnosis not present

## 2018-03-12 DIAGNOSIS — E7849 Other hyperlipidemia: Secondary | ICD-10-CM | POA: Diagnosis not present

## 2018-03-12 NOTE — Progress Notes (Signed)
Office: [phone]  /  Fax: [phone]   HPI:   Chief Complaint: OBESITY Robin Knapp is here to discuss her progress with her obesity treatment plan. She is on the Category 2 plan and is following her eating plan approximately 80 % of the time. She states she is using resistance bands, doing cardio and weights for 45 minutes 2 times per week. Robin Knapp is still struggling with eating all food but consistently getting food in.  Her weight is 178 lb (80.7 kg) today and has had a weight loss of 3 pounds over a period of 2 weeks since her last visit. She has lost 21 lbs since starting treatment with [name].  Pre-Diabetes Robin Knapp has a diagnosis of pre-diabetes based on her elevated Hgb A1c and was informed this puts her at greater risk of developing diabetes. She denies GI side effects of metformin and no carbohydrate cravings. She continues to work on diet and exercise to decrease risk of diabetes. She denies nausea or hypoglycemia.  At risk for diabetes Robin Knapp is at higher than average risk for developing diabetes due to her obesity and pre-diabetes. She currently denies polyuria or polydipsia.  Hyperlipidemia Robin Knapp has hyperlipidemia and has been trying to improve her cholesterol levels with intensive lifestyle modification including a low saturated fat diet, exercise and weight loss. Elevated LDL, and she is consistently following Category 2 plan. She denies any chest pain, claudication or myalgias.  ALLERGIES: Allergies  Allergen Reactions  . Sulfa Antibiotics Hives    itching  . Sunscreens     MEDICATIONS: Current Outpatient Medications on File Prior to Visit  Medication Sig Dispense Refill  . acetaminophen (TYLENOL 8 HOUR ARTHRITIS PAIN) 650 MG CR tablet Take 650 mg by mouth every 8 (eight) hours as needed for pain.    [name] albuterol (PROVENTIL HFA;VENTOLIN HFA) 108 (90 Base) MCG/ACT inhaler Inhale 2 puffs into the lungs every 6 (six) hours as needed for wheezing or shortness of  breath.    . cetirizine (ZYRTEC) 10 MG tablet Take 10 mg by mouth daily.    . Cholecalciferol ([employer] VITAMIN D3) 10000 units CAPS Take by mouth.    . gabapentin (NEURONTIN) 100 MG capsule Take 3 capsules (300 mg total) by mouth at bedtime. 90 capsule 11  . ibuprofen (ADVIL,MOTRIN) 600 MG tablet Take 600 mg by mouth every 6 (six) hours as needed.    [name] levonorgestrel (MIRENA) 20 MCG/24HR IUD 1 each by Intrauterine route once.    . Magnesium 250 MG TABS Take 1 tablet by mouth daily.    . metFORMIN (GLUCOPHAGE) 500 MG tablet Take 1 tablet (500 mg total) by mouth daily with breakfast. 30 tablet 0  . montelukast (SINGULAIR) 10 MG tablet Take 10 mg by mouth at bedtime.    . Multiple Vitamins-Minerals (HAIR SKIN & NAILS ADVANCED PO) Take 1 tablet by mouth daily.    . Omega-3 Fatty Acids (OMEGA-3 FISH OIL) 1200 MG CAPS Take 2 capsules by mouth daily.    . Oral Electrolytes (REVITAL JELL CUPS PO) Take 1 capsule by mouth daily.     No current facility-administered medications on file prior to visit.     PAST MEDICAL HISTORY: Past Medical History:  Diagnosis Date  . Allergy   . Asthma   . Breast lump   . Constipation   . Dry skin   . Fatigue   . GERD (gastroesophageal reflux disease)    pregnancy related-zantac  . HTN (hypertension)   . Hyperlipidemia   . Infertility,  female   . Joint pain   . Lactose intolerance   . Missed abortion    6weeks 3days  . Muscle pain   . Obesity   . Palpitations   . PCOS (polycystic ovarian syndrome)   . Seasonal allergies   . Skin rash   . Stress   . Vitamin D deficiency     PAST SURGICAL HISTORY: Past Surgical History:  Procedure Laterality Date  . BREAST BIOPSY    . CESAREAN SECTION    . DILATION AND EVACUATION  05/09/2012   Procedure: DILATATION AND EVACUATION;  Surgeon: Marvene Staff, MD;  Location: [facility];  Service: Gynecology;  Laterality: N/A;  . EYE SURGERY    . LASIK    . [place]    . WISDOM TOOTH EXTRACTION  1994    SOCIAL  HISTORY: Social History   Tobacco Use  . Smoking status: Never Smoker  Substance Use Topics  . Alcohol use: No    Alcohol/week: 0.0 oz  . Drug use: No    FAMILY HISTORY: Family History  Problem Relation Age of Onset  . Hyperlipidemia Mother   . Hyperlipidemia Father   . Hypertension Father   . Anxiety disorder Father   . Hyperlipidemia Maternal Grandmother   . Hypertension Maternal Grandmother   . Heart disease Maternal Grandmother   . Heart disease Maternal Grandfather   . Diabetes Maternal Grandfather   . Stroke Paternal Grandmother   . Mental illness Paternal Grandmother   . Heart disease Paternal Grandmother     ROS: Review of Systems  Constitutional: Positive for weight loss.  Cardiovascular: Negative for chest pain and claudication.  Gastrointestinal: Negative for nausea.  Genitourinary: Negative for frequency.  Musculoskeletal: Negative for myalgias.  Endo/Heme/Allergies: Negative for polydipsia.       Negative hypoglycemia    PHYSICAL EXAM: Blood pressure 117/80, pulse 63, temperature 97.8 F (36.6 C), temperature source Oral, height 5\' 5"  (1.651 m), weight 178 lb (80.7 kg), SpO2 100 %. Body mass index is 29.62 kg/m. Physical Exam  Constitutional: She is oriented to person, place, and time. She appears well-developed and well-nourished.  Cardiovascular: Normal rate.  Pulmonary/Chest: Effort normal.  Musculoskeletal: Normal range of motion.  Neurological: She is oriented to person, place, and time.  Skin: Skin is warm and dry.  Psychiatric: She has a normal mood and affect. Her behavior is normal.  Vitals reviewed.   RECENT LABS AND TESTS: BMET    Component Value Date/Time   NA 139 11/27/2017 1203   K 4.3 11/27/2017 1203   CL 99 11/27/2017 1203   CO2 24 11/27/2017 1203   GLUCOSE 84 11/27/2017 1203   GLUCOSE 91 04/06/2015 1715   BUN 11 11/27/2017 1203   CREATININE 0.89 11/27/2017 1203   CALCIUM 9.7 11/27/2017 1203   GFRNONAA 79 11/27/2017 1203    GFRAA 91 11/27/2017 1203   Lab Results  Component Value Date   HGBA1C 5.7 (H) 11/27/2017   Lab Results  Component Value Date   INSULIN 12.2 11/27/2017   CBC    Component Value Date/Time   WBC 8.0 11/27/2017 1203   WBC 10.4 04/06/2015 1715   RBC 4.54 11/27/2017 1203   RBC 4.58 04/06/2015 1715   HGB 13.6 11/27/2017 1203   HCT 38.8 11/27/2017 1203   PLT 343 04/06/2015 1715   MCV 86 11/27/2017 1203   MCH 30.0 11/27/2017 1203   MCH 30.3 04/06/2015 1715   MCHC 35.1 11/27/2017 1203   MCHC 33.7 04/06/2015 1715  RDW 14.5 11/27/2017 1203   LYMPHSABS 2.2 11/27/2017 1203   MONOABS 0.6 04/06/2015 1715   EOSABS 0.2 11/27/2017 1203   BASOSABS 0.0 11/27/2017 1203   Iron/TIBC/Ferritin/ %Sat No results found for: IRON, TIBC, FERRITIN, IRONPCTSAT Lipid Panel     Component Value Date/Time   CHOL 240 (H) 11/27/2017 1203   TRIG 107 11/27/2017 1203   HDL 44 11/27/2017 1203   LDLCALC 175 (H) 11/27/2017 1203   Hepatic Function Panel     Component Value Date/Time   PROT 7.8 11/27/2017 1203   ALBUMIN 4.8 11/27/2017 1203   AST 20 11/27/2017 1203   ALT 20 11/27/2017 1203   ALKPHOS 42 11/27/2017 1203   BILITOT 0.4 11/27/2017 1203      Component Value Date/Time   TSH 0.667 11/27/2017 1203   TSH 0.406 05/26/2009 1040    ASSESSMENT AND PLAN: Prediabetes - Plan: Hemoglobin A1c, Insulin, random  Other hyperlipidemia - Plan: Lipid Panel With LDL/HDL Ratio  At risk for diabetes mellitus  Class 1 obesity with serious comorbidity and body mass index (BMI) of 30.0 to 30.9 in adult, unspecified obesity type - Starting BMI greater then 30  PLAN:  Pre-Diabetes Robin Knapp will continue to work on weight loss, exercise, and decreasing simple carbohydrates in her diet to help decrease the risk of diabetes. We dicussed metformin including benefits and risks. She was informed that eating too many simple carbohydrates or too many calories at one sitting increases the likelihood of GI side  effects. Robin Knapp agrees to continue taking metformin and we will check labs today. Robin Knapp agrees to follow up with our clinic in 2 weeks as directed to monitor her progress.  Diabetes risk counselling Robin Knapp was given extended (15 minutes) diabetes prevention counseling today. She is 48 y.o. female and has risk factors for diabetes including obesity and pre-diabetes. We discussed intensive lifestyle modifications today with an emphasis on weight loss as well as increasing exercise and decreasing simple carbohydrates in her diet.  Hyperlipidemia Robin Knapp was informed of the American Heart Association Guidelines emphasizing intensive lifestyle modifications as the first line treatment for hyperlipidemia. We discussed many lifestyle modifications today in depth, and Robin Knapp will continue to work on decreasing saturated fats such as fatty red meat, butter and many fried foods. She will also increase vegetables and lean protein in her diet and continue to work on exercise and weight loss efforts. We will check labs today and Sanyla agrees to follow up with our clinic in 2 weeks.  Obesity Robin Knapp is currently in the action stage of change. As such, her goal is to continue with weight loss efforts She has agreed to follow the Category 2 plan Robin Knapp has been instructed to work up to a goal of 150 minutes of combined cardio and strengthening exercise per week or physical activity for 15-20 minutes 4 times per week for weight loss and overall health benefits. We discussed the following Behavioral Modification Strategies today: increasing lean protein intake, increasing vegetables, work on meal planning and easy cooking plans, and planning for success   Robin Knapp has agreed to follow up with our clinic in 2 weeks. She was informed of the importance of frequent follow up visits to maximize her success with intensive lifestyle modifications for her multiple health conditions.   OBESITY BEHAVIORAL INTERVENTION  VISIT  Today's visit was # 7 out of 22.  Starting weight: 199 lbs Starting date: 11/27/17 Today's weight : 178 lbs  Today's date: 03/12/2018 Total lbs lost to date: 21 (Patients  must lose 7 lbs in the first 6 months to continue with counseling)   ASK: We discussed the diagnosis of obesity with Robin Knapp today and Robin Knapp agreed to give [name] permission to discuss obesity behavioral modification therapy today.  ASSESS: Robin Knapp has the diagnosis of obesity and her BMI today is 29.62 Robin Knapp is in the action stage of change   ADVISE: Robin Knapp was educated on the multiple health risks of obesity as well as the benefit of weight loss to improve her health. She was advised of the need for long term treatment and the importance of lifestyle modifications.  AGREE: Multiple dietary modification options and treatment options were discussed and  Robin Knapp agreed to the above obesity treatment plan.  I, Trixie Dredge, am acting as transcriptionist for Ilene Qua, MD  I have reviewed the above documentation for accuracy and completeness, and I agree with the above. - Ilene Qua, MD

## 2018-03-13 LAB — LIPID PANEL WITH LDL/HDL RATIO
Cholesterol, Total: 183 mg/dL (ref 100–199)
HDL: 39 mg/dL — ABNORMAL LOW (ref >39)
LDL Calculated: 128 mg/dL — ABNORMAL HIGH (ref 0–99)
LDl/HDL Ratio: 3.3 ratio — ABNORMAL HIGH (ref 0.0–3.2)
TRIGLYCERIDES: 80 mg/dL (ref 0–149)
VLDL CHOLESTEROL CAL: 16 mg/dL (ref 5–40)

## 2018-03-13 LAB — HEMOGLOBIN A1C
Est. average glucose Bld gHb Est-mCnc: 108 mg/dL
Hgb A1c MFr Bld: 5.4 % (ref 4.8–5.6)

## 2018-03-13 LAB — INSULIN, RANDOM: INSULIN: 6.7 u[IU]/mL (ref 2.6–24.9)

## 2018-03-26 ENCOUNTER — Encounter (INDEPENDENT_AMBULATORY_CARE_PROVIDER_SITE_OTHER): Payer: Self-pay | Admitting: Family Medicine

## 2018-04-01 ENCOUNTER — Ambulatory Visit (INDEPENDENT_AMBULATORY_CARE_PROVIDER_SITE_OTHER): Payer: 59 | Admitting: Family Medicine

## 2018-04-01 VITALS — BP 104/68 | HR 64 | Temp 98.0°F | Ht 65.0 in | Wt 177.0 lb

## 2018-04-01 DIAGNOSIS — Z6830 Body mass index (BMI) 30.0-30.9, adult: Secondary | ICD-10-CM | POA: Diagnosis not present

## 2018-04-01 DIAGNOSIS — E669 Obesity, unspecified: Secondary | ICD-10-CM

## 2018-04-01 DIAGNOSIS — E559 Vitamin D deficiency, unspecified: Secondary | ICD-10-CM

## 2018-04-01 DIAGNOSIS — R7303 Prediabetes: Secondary | ICD-10-CM | POA: Diagnosis not present

## 2018-04-01 NOTE — Progress Notes (Signed)
Office: [phone]  /  Fax: [phone]   HPI:   Chief Complaint: OBESITY Robin Knapp is here to discuss her progress with her obesity treatment plan. She is on the Category 2 plan and is following her eating plan approximately 60 % of the time. She states she is walking and doing circuit workout for 20 minutes 3-4 times per week. Robin Knapp has noticed lack of hunger but still getting all food in.  Her weight is 177 lb (80.3 kg) today and has had a weight loss of 1 pound over a period of 2 to 3 weeks since her last visit. She has lost 22 lbs since starting treatment with [name].  Pre-Diabetes Robin Knapp has a diagnosis of pre-diabetes based on her elevated Hgb A1c and was informed this puts her at greater risk of developing diabetes. Improved Hgb A1c and insulin last draw. She is taking metformin currently and continues to work on diet and exercise to decrease risk of diabetes. She denies nausea or hypoglycemia.  Vitamin D Deficiency Robin Knapp has a diagnosis of vitamin D deficiency. She is on OTC Vit D and denies nausea, vomiting or muscle weakness.  ALLERGIES: Allergies  Allergen Reactions  . Sulfa Antibiotics Hives    itching  . Sunscreens     MEDICATIONS: Current Outpatient Medications on File Prior to Visit  Medication Sig Dispense Refill  . acetaminophen (TYLENOL 8 HOUR ARTHRITIS PAIN) 650 MG CR tablet Take 650 mg by mouth every 8 (eight) hours as needed for pain.    [name] albuterol (PROVENTIL HFA;VENTOLIN HFA) 108 (90 Base) MCG/ACT inhaler Inhale 2 puffs into the lungs every 6 (six) hours as needed for wheezing or shortness of breath.    . cetirizine (ZYRTEC) 10 MG tablet Take 10 mg by mouth daily.    . Cholecalciferol (CVS VITAMIN D3) 10000 units CAPS Take by mouth.    . gabapentin (NEURONTIN) 100 MG capsule Take 3 capsules (300 mg total) by mouth at bedtime. 90 capsule 11  . ibuprofen (ADVIL,MOTRIN) 600 MG tablet Take 600 mg by mouth every 6 (six) hours as needed.    [name] levonorgestrel  (MIRENA) 20 MCG/24HR IUD 1 each by Intrauterine route once.    . Magnesium 250 MG TABS Take 1 tablet by mouth daily.    . metFORMIN (GLUCOPHAGE) 500 MG tablet Take 1 tablet (500 mg total) by mouth daily with breakfast. 30 tablet 0  . montelukast (SINGULAIR) 10 MG tablet Take 10 mg by mouth at bedtime.    . Multiple Vitamins-Minerals (HAIR SKIN & NAILS ADVANCED PO) Take 1 tablet by mouth daily.    . Omega-3 Fatty Acids (OMEGA-3 FISH OIL) 1200 MG CAPS Take 2 capsules by mouth daily.    . Oral Electrolytes (REVITAL JELL CUPS PO) Take 1 capsule by mouth daily.     No current facility-administered medications on file prior to visit.     PAST MEDICAL HISTORY: Past Medical History:  Diagnosis Date  . Allergy   . Asthma   . Breast lump   . Constipation   . Dry skin   . Fatigue   . GERD (gastroesophageal reflux disease)    pregnancy related-zantac  . HTN (hypertension)   . Hyperlipidemia   . Infertility, female   . Joint pain   . Lactose intolerance   . Missed abortion    6weeks 3days  . Muscle pain   . Obesity   . Palpitations   . PCOS (polycystic ovarian syndrome)   . Seasonal allergies   .  Skin rash   . Stress   . Vitamin D deficiency     PAST SURGICAL HISTORY: Past Surgical History:  Procedure Laterality Date  . BREAST BIOPSY    . CESAREAN SECTION    . DILATION AND EVACUATION  05/09/2012   Procedure: DILATATION AND EVACUATION;  Surgeon: Marvene Staff, MD;  Location: [facility];  Service: Gynecology;  Laterality: N/A;  . EYE SURGERY    . LASIK    . [place]    . WISDOM TOOTH EXTRACTION  1994    SOCIAL HISTORY: Social History   Tobacco Use  . Smoking status: Never Smoker  Substance Use Topics  . Alcohol use: No    Alcohol/week: 0.0 oz  . Drug use: No    FAMILY HISTORY: Family History  Problem Relation Age of Onset  . Hyperlipidemia Mother   . Hyperlipidemia Father   . Hypertension Father   . Anxiety disorder Father   . Hyperlipidemia Maternal Grandmother     . Hypertension Maternal Grandmother   . Heart disease Maternal Grandmother   . Heart disease Maternal Grandfather   . Diabetes Maternal Grandfather   . Stroke Paternal Grandmother   . Mental illness Paternal Grandmother   . Heart disease Paternal Grandmother     ROS: Review of Systems  Constitutional: Positive for weight loss.  Gastrointestinal: Negative for nausea and vomiting.  Musculoskeletal:       Negative muscle weakness  Endo/Heme/Allergies:       Negative hypoglycemia    PHYSICAL EXAM: Blood pressure 104/68, pulse 64, temperature 98 F (36.7 C), temperature source Oral, height 5\' 5"  (1.651 m), weight 177 lb (80.3 kg), SpO2 98 %. Body mass index is 29.45 kg/m. Physical Exam  Constitutional: She is oriented to person, place, and time. She appears well-developed and well-nourished.  Cardiovascular: Normal rate.  Pulmonary/Chest: Effort normal.  Musculoskeletal: Normal range of motion.  Neurological: She is oriented to person, place, and time.  Skin: Skin is warm and dry.  Psychiatric: She has a normal mood and affect. Her behavior is normal.  Vitals reviewed.   RECENT LABS AND TESTS: BMET    Component Value Date/Time   NA 139 11/27/2017 1203   K 4.3 11/27/2017 1203   CL 99 11/27/2017 1203   CO2 24 11/27/2017 1203   GLUCOSE 84 11/27/2017 1203   GLUCOSE 91 04/06/2015 1715   BUN 11 11/27/2017 1203   CREATININE 0.89 11/27/2017 1203   CALCIUM 9.7 11/27/2017 1203   GFRNONAA 79 11/27/2017 1203   GFRAA 91 11/27/2017 1203   Lab Results  Component Value Date   HGBA1C 5.4 03/12/2018   HGBA1C 5.7 (H) 11/27/2017   Lab Results  Component Value Date   INSULIN 6.7 03/12/2018   INSULIN 12.2 11/27/2017   CBC    Component Value Date/Time   WBC 8.0 11/27/2017 1203   WBC 10.4 04/06/2015 1715   RBC 4.54 11/27/2017 1203   RBC 4.58 04/06/2015 1715   HGB 13.6 11/27/2017 1203   HCT 38.8 11/27/2017 1203   PLT 343 04/06/2015 1715   MCV 86 11/27/2017 1203   MCH 30.0  11/27/2017 1203   MCH 30.3 04/06/2015 1715   MCHC 35.1 11/27/2017 1203   MCHC 33.7 04/06/2015 1715   RDW 14.5 11/27/2017 1203   LYMPHSABS 2.2 11/27/2017 1203   MONOABS 0.6 04/06/2015 1715   EOSABS 0.2 11/27/2017 1203   BASOSABS 0.0 11/27/2017 1203   Iron/TIBC/Ferritin/ %Sat No results found for: IRON, TIBC, FERRITIN, IRONPCTSAT Lipid Panel  Component Value Date/Time   CHOL 183 03/12/2018 0933   TRIG 80 03/12/2018 0933   HDL 39 (L) 03/12/2018 0933   LDLCALC 128 (H) 03/12/2018 0933   Hepatic Function Panel     Component Value Date/Time   PROT 7.8 11/27/2017 1203   ALBUMIN 4.8 11/27/2017 1203   AST 20 11/27/2017 1203   ALT 20 11/27/2017 1203   ALKPHOS 42 11/27/2017 1203   BILITOT 0.4 11/27/2017 1203      Component Value Date/Time   TSH 0.667 11/27/2017 1203   TSH 0.406  05/26/2009 1040  Results for Robin Knapp, MARMOL (MRN [id number]) as of 04/01/2018 15:26  Ref. Range 11/27/2017 12:03  Vitamin D, 25-Hydroxy Latest Ref Range: 30.0 - 100.0 ng/mL 40.3    ASSESSMENT AND PLAN: Prediabetes  Vitamin D deficiency  Class 1 obesity with serious comorbidity and body mass index (BMI) of 30.0 to 30.9 in adult, unspecified obesity type - Starting BMI greater then 30  PLAN:  Pre-Diabetes Robin Knapp will continue to work on weight loss, exercise, and decreasing simple carbohydrates in her diet to help decrease the risk of diabetes. We dicussed metformin including benefits and risks. She was informed that eating too many simple carbohydrates or too many calories at one sitting increases the likelihood of GI side effects. Blaklee agrees to continue taking metformin and she agrees to follow up with our clinic in 2 weeks as directed to monitor her progress.  Vitamin D Deficiency Robin Knapp was informed that low vitamin D levels contributes to fatigue and are associated with obesity, breast, and colon cancer. Robin Knapp agrees to continue to taking OTC Vit D and will follow up for routine testing  of vitamin D, at least 2-3 times per year. She was informed of the risk of over-replacement of vitamin D and agrees to not increase her dose unless she discusses this with [name] first. Amazing agrees to follow up with our clinic in 2 weeks  We spent > than 50% of the 15 minute visit on the counseling as documented in the note.  Obesity Robin Knapp is currently in the action stage of change. As such, her goal is to continue with weight loss efforts She has agreed to follow the Category 2 plan Robin Knapp has been instructed to work up to a goal of 150 minutes of combined cardio and strengthening exercise per week for weight loss and overall health benefits. We discussed the following Behavioral Modification Strategies today: increasing lean protein intake, increasing vegetables, work on meal planning and easy cooking plans, and planning for success   Robin Knapp has agreed to follow up with our clinic in 2 weeks. She was informed of the importance of frequent follow up visits to maximize her success with intensive lifestyle modifications for her multiple health conditions.   OBESITY BEHAVIORAL INTERVENTION VISIT  Today's visit was # 8 out of 22.  Starting weight: 199 lbs Starting date: 11/27/17 Today's weight : 177 lbs Today's date: 04/01/2018 Total lbs lost to date: 65    ASK: We discussed the diagnosis of obesity with Robin Knapp today and Robin Knapp agreed to give [name] permission to discuss obesity behavioral modification therapy today.  ASSESS: Robin Knapp has the diagnosis of obesity and her BMI today is 29.45 Robin Knapp is in the action stage of change   ADVISE: Robin Knapp was educated on the multiple health risks of obesity as well as the benefit of weight loss to improve her health. She was advised of the need for long term treatment and the importance  of lifestyle modifications.  AGREE: Multiple dietary modification options and treatment options were discussed and  Robin Knapp agreed to the above  obesity treatment plan.  I, Trixie Dredge, am acting as transcriptionist for Ilene Qua, MD  I have reviewed the above documentation for accuracy and completeness, and I agree with the above. - Ilene Qua, MD

## 2018-04-22 ENCOUNTER — Ambulatory Visit (INDEPENDENT_AMBULATORY_CARE_PROVIDER_SITE_OTHER): Payer: 59 | Admitting: Family Medicine

## 2018-04-22 VITALS — BP 113/70 | HR 77 | Temp 97.9°F | Ht 65.0 in | Wt 179.0 lb

## 2018-04-22 DIAGNOSIS — Z9189 Other specified personal risk factors, not elsewhere classified: Secondary | ICD-10-CM

## 2018-04-22 DIAGNOSIS — E7849 Other hyperlipidemia: Secondary | ICD-10-CM

## 2018-04-22 DIAGNOSIS — Z6830 Body mass index (BMI) 30.0-30.9, adult: Secondary | ICD-10-CM

## 2018-04-22 DIAGNOSIS — E66811 Obesity, class 1: Secondary | ICD-10-CM

## 2018-04-22 DIAGNOSIS — E669 Obesity, unspecified: Secondary | ICD-10-CM

## 2018-04-22 DIAGNOSIS — R7303 Prediabetes: Secondary | ICD-10-CM | POA: Diagnosis not present

## 2018-04-22 MED ORDER — METFORMIN HCL 500 MG PO TABS: 500.0000 mg | ORAL_TABLET | Freq: Every day | ORAL | 0 refills | Status: DC

## 2018-04-22 MED FILL — metFORMIN HCL 500 MG TABS: 500 | 30 days supply | Qty: 30 | Fill #0

## 2018-04-23 NOTE — Progress Notes (Signed)
Office: [phone]  /  Fax: [phone]   HPI:   Chief Complaint: OBESITY Robin Knapp is here to discuss her progress with her obesity treatment plan. She is on the Category 2 plan and is following her eating plan approximately 30 % of the time. She states she is walking and doing cardio for 20 minutes 3 times per week. Robin Knapp had a vacation in [place], eating out and indulging. No hunger.  Her weight is 179 lb (81.2 kg) today and has gained 2 pounds since her last visit. She has lost 20 lbs since starting treatment with [name].  Pre-Diabetes Robin Knapp has a diagnosis of pre-diabetes based on her elevated Hgb A1c and was informed this puts her at greater risk of developing diabetes. She denies GI side effects on metformin and no hunger. She continues to work on diet and exercise to decrease risk of diabetes. She denies nausea or hypoglycemia.  At risk for diabetes Robin Knapp is at higher than average risk for developing diabetes due to her obesity and pre-diabetes. She currently denies polyuria or polydipsia.  Hyperlipidemia Robin Knapp has hyperlipidemia and has been trying to improve her cholesterol levels with intensive lifestyle modification including a low saturated fat diet, exercise and weight loss. Elevation of LDL. She denies any chest pain, claudication or myalgias.  ALLERGIES: Allergies  Allergen Reactions  . Sulfa Antibiotics Hives    itching  . Sunscreens     MEDICATIONS: Current Outpatient Medications on File Prior to Visit  Medication Sig Dispense Refill  . acetaminophen (TYLENOL 8 HOUR ARTHRITIS PAIN) 650 MG CR tablet Take 650 mg by mouth every 8 (eight) hours as needed for pain.    [name] albuterol (PROVENTIL HFA;VENTOLIN HFA) 108 (90 Base) MCG/ACT inhaler Inhale 2 puffs into the lungs every 6 (six) hours as needed for wheezing or shortness of breath.    . cetirizine (ZYRTEC) 10 MG tablet Take 10 mg by mouth daily.    . Cholecalciferol ([employer] VITAMIN D3) 10000 units CAPS Take  by mouth.    . gabapentin (NEURONTIN) 100 MG capsule Take 3 capsules (300 mg total) by mouth at bedtime. 90 capsule 11  . ibuprofen (ADVIL,MOTRIN) 600 MG tablet Take 600 mg by mouth every 6 (six) hours as needed.    [name] levonorgestrel (MIRENA) 20 MCG/24HR IUD 1 each by Intrauterine route once.    . Magnesium 250 MG TABS Take 1 tablet by mouth daily.    . montelukast (SINGULAIR) 10 MG tablet Take 10 mg by mouth at bedtime.    . Multiple Vitamins-Minerals (HAIR SKIN & NAILS ADVANCED PO) Take 1 tablet by mouth daily.    . Omega-3 Fatty Acids (OMEGA-3 FISH OIL) 1200 MG CAPS Take 2 capsules by mouth daily.    . Oral Electrolytes (REVITAL JELL CUPS PO) Take 1 capsule by mouth daily.     No current facility-administered medications on file prior to visit.     PAST MEDICAL HISTORY: Past Medical History:  Diagnosis Date  . Allergy   . Asthma   . Breast lump   . Constipation   . Dry skin   . Fatigue   . GERD (gastroesophageal reflux disease)    pregnancy related-zantac  . HTN (hypertension)   . Hyperlipidemia   . Infertility, female   . Joint pain   . Lactose intolerance   . Missed abortion    6weeks 3days  . Muscle pain   . Obesity   . Palpitations   . PCOS (polycystic ovarian syndrome)   .  Seasonal allergies   . Skin rash   . Stress   . Vitamin D deficiency     PAST SURGICAL HISTORY: Past Surgical History:  Procedure Laterality Date  . BREAST BIOPSY    . CESAREAN SECTION    . DILATION AND EVACUATION  05/09/2012   Procedure: DILATATION AND EVACUATION;  Surgeon: Marvene Staff, MD;  Location: [facility];  Service: Gynecology;  Laterality: N/A;  . EYE SURGERY    . LASIK    . [place]    . WISDOM TOOTH EXTRACTION  1994    SOCIAL HISTORY: Social History   Tobacco Use  . Smoking status: Never Smoker  Substance Use Topics  . Alcohol use: No    Alcohol/week: 0.0 oz  . Drug use: No    FAMILY HISTORY: Family History  Problem Relation Age of Onset  . Hyperlipidemia Mother     . Hyperlipidemia Father   . Hypertension Father   . Anxiety disorder Father   . Hyperlipidemia Maternal Grandmother   . Hypertension Maternal Grandmother   . Heart disease Maternal Grandmother   . Heart disease Maternal Grandfather   . Diabetes Maternal Grandfather   . Stroke Paternal Grandmother   . Mental illness Paternal Grandmother   . Heart disease Paternal Grandmother     ROS: Review of Systems  Constitutional: Negative for weight loss.  Cardiovascular: Negative for chest pain and claudication.  Gastrointestinal: Negative for nausea.  Genitourinary: Negative for frequency.  Musculoskeletal: Negative for myalgias.  Endo/Heme/Allergies: Negative for polydipsia.       Negative hypoglycemia    PHYSICAL EXAM: Blood pressure 113/70, pulse 77, temperature 97.9 F (36.6 C), temperature source Oral, height 5\' 5"  (1.651 m), weight 179 lb (81.2 kg), SpO2 99 %. Body mass index is 29.79 kg/m. Physical Exam  Constitutional: She is oriented to person, place, and time. She appears well-developed and well-nourished.  Cardiovascular: Normal rate.  Pulmonary/Chest: Effort normal.  Musculoskeletal: Normal range of motion.  Neurological: She is oriented to person, place, and time.  Skin: Skin is warm and dry.  Psychiatric: She has a normal mood and affect. Her behavior is normal.  Vitals reviewed.   RECENT LABS AND TESTS: BMET    Component Value Date/Time   NA 139 11/27/2017 1203   K 4.3 11/27/2017 1203   CL 99 11/27/2017 1203   CO2 24 11/27/2017 1203   GLUCOSE 84 11/27/2017 1203   GLUCOSE 91 04/06/2015 1715   BUN 11 11/27/2017 1203   CREATININE 0.89 11/27/2017 1203   CALCIUM 9.7 11/27/2017 1203   GFRNONAA 79 11/27/2017 1203   GFRAA 91 11/27/2017 1203   Lab Results  Component Value Date   HGBA1C 5.4 03/12/2018   HGBA1C 5.7 (H) 11/27/2017   Lab Results  Component Value Date   INSULIN 6.7 03/12/2018   INSULIN 12.2 11/27/2017   CBC    Component Value Date/Time    WBC 8.0 11/27/2017 1203   WBC 10.4 04/06/2015 1715   RBC 4.54 11/27/2017 1203   RBC 4.58 04/06/2015 1715   HGB 13.6 11/27/2017 1203   HCT 38.8 11/27/2017 1203   PLT 343 04/06/2015 1715   MCV 86 11/27/2017 1203   MCH 30.0 11/27/2017 1203   MCH 30.3 04/06/2015 1715   MCHC 35.1 11/27/2017 1203   MCHC 33.7 04/06/2015 1715   RDW 14.5 11/27/2017 1203   LYMPHSABS 2.2 11/27/2017 1203   MONOABS 0.6 04/06/2015 1715   EOSABS 0.2 11/27/2017 1203   BASOSABS 0.0 11/27/2017 1203  Iron/TIBC/Ferritin/ %Sat No results found for: IRON, TIBC, FERRITIN, IRONPCTSAT Lipid Panel     Component Value Date/Time   CHOL 183 03/12/2018 0933   TRIG 80 03/12/2018 0933   HDL 39 (L) 03/12/2018 0933   LDLCALC 128 (H) 03/12/2018 0933   Hepatic Function Panel     Component Value Date/Time   PROT 7.8 11/27/2017 1203   ALBUMIN 4.8 11/27/2017 1203   AST 20 11/27/2017 1203   ALT 20 11/27/2017 1203   ALKPHOS 42 11/27/2017 1203   BILITOT 0.4 11/27/2017 1203      Component Value Date/Time   TSH 0.667 11/27/2017 1203   TSH 0.406 05/26/2009 1040    ASSESSMENT AND PLAN: Prediabetes - Plan: metFORMIN (GLUCOPHAGE) 500 MG tablet  Other hyperlipidemia  At risk for diabetes mellitus  Class 1 obesity with serious comorbidity and body mass index (BMI) of 30.0 to 30.9 in adult, unspecified obesity type - Starting BMI greater then 30  PLAN:  Pre-Diabetes Robin Knapp will continue to work on weight loss, exercise, and decreasing simple carbohydrates in her diet to help decrease the risk of diabetes. We dicussed metformin including benefits and risks. She was informed that eating too many simple carbohydrates or too many calories at one sitting increases the likelihood of GI side effects. Robin Knapp agrees to continue taking metformin 500 mg PO q AM #30 and we will refill for 1 month. Robin Knapp agrees to follow up with our clinic in 2 weeks as directed to monitor her progress.  Diabetes risk counselling Robin Knapp was given  extended (15 minutes) diabetes prevention counseling today. She is 47 y.o. female and has risk factors for diabetes including obesity and pre-diabetes. We discussed intensive lifestyle modifications today with an emphasis on weight loss as well as increasing exercise and decreasing simple carbohydrates in her diet.  Hyperlipidemia Robin Knapp was informed of the American Heart Association Guidelines emphasizing intensive lifestyle modifications as the first line treatment for hyperlipidemia. We discussed many lifestyle modifications today in depth, and Robin Knapp will continue to work on decreasing saturated fats such as fatty red meat, butter and many fried foods. She will also increase vegetables and lean protein in her diet and continue to work on exercise and weight loss efforts. We will repeat labs in early October. Robin Knapp agrees to follow up with our clinic in 2 weeks.  Obesity Robin Knapp is currently in the action stage of change. As such, her goal is to continue with weight loss efforts She has agreed to follow the Category 2 plan Robin Knapp has been instructed to work up to a goal of 150 minutes of combined cardio and strengthening exercise per week for weight loss and overall health benefits. We discussed the following Behavioral Modification Strategies today: increasing lean protein intake, increasing vegetables, work on meal planning and easy cooking plans, better snacking choices, and planning for success   Robin Knapp has agreed to follow up with our clinic in 2 weeks. She was informed of the importance of frequent follow up visits to maximize her success with intensive lifestyle modifications for her multiple health conditions.   OBESITY BEHAVIORAL INTERVENTION VISIT  Today's visit was # 9 out of 22.  Starting weight: 199 lbs Starting date: 11/27/17 Today's weight : 179 lbs  Today's date: 04/22/2018 Total lbs lost to date: 4    ASK: We discussed the diagnosis of obesity with Robin Knapp today and Robin Knapp agreed to give [name] permission to discuss obesity behavioral modification therapy today.  ASSESS: Robin Knapp has the diagnosis of  obesity and her BMI today is 29.79 Robin Knapp is in the action stage of change   ADVISE: Robin Knapp was educated on the multiple health risks of obesity as well as the benefit of weight loss to improve her health. She was advised of the need for long term treatment and the importance of lifestyle modifications.  AGREE: Multiple dietary modification options and treatment options were discussed and  Robin Knapp agreed to the above obesity treatment plan.  I, Trixie Dredge, am acting as transcriptionist for Ilene Qua, MD  I have reviewed the above documentation for accuracy and completeness, and I agree with the above. - Ilene Qua, MD

## 2018-05-06 ENCOUNTER — Ambulatory Visit (INDEPENDENT_AMBULATORY_CARE_PROVIDER_SITE_OTHER): Payer: 59 | Admitting: Family Medicine

## 2018-05-06 VITALS — BP 100/62 | HR 80 | Temp 97.9°F | Ht 65.0 in | Wt 177.0 lb

## 2018-05-06 DIAGNOSIS — R7303 Prediabetes: Secondary | ICD-10-CM

## 2018-05-06 DIAGNOSIS — Z6830 Body mass index (BMI) 30.0-30.9, adult: Secondary | ICD-10-CM | POA: Diagnosis not present

## 2018-05-06 DIAGNOSIS — E559 Vitamin D deficiency, unspecified: Secondary | ICD-10-CM | POA: Diagnosis not present

## 2018-05-06 DIAGNOSIS — E66811 Obesity, class 1: Secondary | ICD-10-CM

## 2018-05-06 DIAGNOSIS — E669 Obesity, unspecified: Secondary | ICD-10-CM

## 2018-05-06 NOTE — Progress Notes (Signed)
Office: [phone]  /  Fax: [phone]   HPI:   Chief Complaint: OBESITY Robin Knapp is here to discuss her progress with her obesity treatment plan. She is on the Category 2 plan and is following her eating plan approximately 50 % of the time. She states she is walking and doing cardio, light weight training 30 minutes 2 times per week. Robin Knapp is trying to plan ahead for her daughter starting school next week. She hasn't been tracking in the past two weeks. Her weight is 177 lb (80.3 kg) today and has had a weight loss of 2 pounds over a period of 2 weeks since her last visit. She has lost 22 lbs since starting treatment with [name].  Pre-Diabetes Robin Knapp has a diagnosis of prediabetes based on her elevated Hgb A1c and was informed this puts her at greater risk of developing diabetes. She has no GI side effects of Metformin. Robin Knapp continues to work on diet and exercise to decrease risk of diabetes. She denies hypoglycemia.  Vitamin D deficiency Robin Knapp has a diagnosis of vitamin D deficiency. She is currently taking OTC vit D 10,000 IU daily and denies nausea, vomiting or muscle weakness.  ALLERGIES: Allergies  Allergen Reactions  . Sulfa Antibiotics Hives    itching  . Sunscreens     MEDICATIONS: Current Outpatient Medications on File Prior to Visit  Medication Sig Dispense Refill  . acetaminophen (TYLENOL 8 HOUR ARTHRITIS PAIN) 650 MG CR tablet Take 650 mg by mouth every 8 (eight) hours as needed for pain.    Robin Knapp Kitchen albuterol (PROVENTIL HFA;VENTOLIN HFA) 108 (90 Base) MCG/ACT inhaler Inhale 2 puffs into the lungs every 6 (six) hours as needed for wheezing or shortness of breath.    . cetirizine (ZYRTEC) 10 MG tablet Take 10 mg by mouth daily.    . Cholecalciferol ([employer]S VITAMIN D3) 10000 units CAPS Take by mouth.    . gabapentin (NEURONTIN) 100 MG capsule Take 3 capsules (300 mg total) by mouth at bedtime. 90 capsule 11  . ibuprofen (ADVIL,MOTRIN) 600 MG tablet Take 600 mg by mouth  every 6 (six) hours as needed.    Robin Knapp Kitchen levonorgestrel (MIRENA) 20 MCG/24HR IUD 1 each by Intrauterine route once.    . Magnesium 250 MG TABS Take 1 tablet by mouth daily.    . metFORMIN (GLUCOPHAGE) 500 MG tablet Take 1 tablet (500 mg total) by mouth daily with breakfast. 30 tablet 0  . montelukast (SINGULAIR) 10 MG tablet Take 10 mg by mouth at bedtime.    . Multiple Vitamins-Minerals (HAIR SKIN & NAILS ADVANCED PO) Take 1 tablet by mouth daily.    . Omega-3 Fatty Acids (OMEGA-3 FISH OIL) 1200 MG CAPS Take 2 capsules by mouth daily.    . Oral Electrolytes (REVITAL JELL CUPS PO) Take 1 capsule by mouth daily.     No current facility-administered medications on file prior to visit.     PAST MEDICAL HISTORY: Past Medical History:  Diagnosis Date  . Allergy   . Asthma   . Breast lump   . Constipation   . Dry skin   . Fatigue   . GERD (gastroesophageal reflux disease)    pregnancy related-zantac  . HTN (hypertension)   . Hyperlipidemia   . Infertility, female   . Joint pain   . Lactose intolerance   . Missed abortion    6weeks 3days  . Muscle pain   . Obesity   . Palpitations   . PCOS (polycystic ovarian syndrome)   .  Seasonal allergies   . Skin rash   . Stress   . Vitamin D deficiency     PAST SURGICAL HISTORY: Past Surgical History:  Procedure Laterality Date  . BREAST BIOPSY    . CESAREAN SECTION    . DILATION AND EVACUATION  05/09/2012   Procedure: DILATATION AND EVACUATION;  Surgeon: Marvene Staff, MD;  Location: [facility];  Service: Gynecology;  Laterality: N/A;  . EYE SURGERY    . LASIK    . [place]    . WISDOM TOOTH EXTRACTION  1994    SOCIAL HISTORY: Social History   Tobacco Use  . Smoking status: Never Smoker  Substance Use Topics  . Alcohol use: No    Alcohol/week: 0.0 standard drinks  . Drug use: No    FAMILY HISTORY: Family History  Problem Relation Age of Onset  . Hyperlipidemia Mother   . Hyperlipidemia Father   . Hypertension Father   .  Anxiety disorder Father   . Hyperlipidemia Maternal Grandmother   . Hypertension Maternal Grandmother   . Heart disease Maternal Grandmother   . Heart disease Maternal Grandfather   . Diabetes Maternal Grandfather   . Stroke Paternal Grandmother   . Mental illness Paternal Grandmother   . Heart disease Paternal Grandmother     ROS: Review of Systems  Constitutional: Positive for weight loss.  Gastrointestinal: Negative for diarrhea, nausea and vomiting.  Musculoskeletal:       Negative for muscle weakness  Endo/Heme/Allergies:       Negative for hypoglycemia    PHYSICAL EXAM: Blood pressure 100/62, pulse 80, temperature 97.9 F (36.6 C), temperature source Oral, height 5\' 5"  (1.651 m), weight 177 lb (80.3 kg), SpO2 98 %. Body mass index is 29.45 kg/m. Physical Exam  Constitutional: She is oriented to person, place, and time. She appears well-developed and well-nourished.  Cardiovascular: Normal rate.  Pulmonary/Chest: Effort normal.  Musculoskeletal: Normal range of motion.  Neurological: She is oriented to person, place, and time.  Skin: Skin is warm and dry.  Psychiatric: She has a normal mood and affect. Her behavior is normal.  Vitals reviewed.   RECENT LABS AND TESTS: BMET    Component Value Date/Time   NA 139 11/27/2017 1203   K 4.3 11/27/2017 1203   CL 99 11/27/2017 1203   CO2 24 11/27/2017 1203   GLUCOSE 84 11/27/2017 1203   GLUCOSE 91 04/06/2015 1715   BUN 11 11/27/2017 1203   CREATININE 0.89 11/27/2017 1203   CALCIUM 9.7 11/27/2017 1203   GFRNONAA 79 11/27/2017 1203   GFRAA 91 11/27/2017 1203   Lab Results  Component Value Date   HGBA1C 5.4 03/12/2018   HGBA1C 5.7 (H) 11/27/2017   Lab Results  Component Value Date   INSULIN 6.7 03/12/2018   INSULIN 12.2 11/27/2017   CBC    Component Value Date/Time   WBC 8.0 11/27/2017 1203   WBC 10.4 04/06/2015 1715   RBC 4.54 11/27/2017 1203   RBC 4.58 04/06/2015 1715   HGB 13.6 11/27/2017 1203   HCT  38.8 11/27/2017 1203   PLT 343 04/06/2015 1715   MCV 86 11/27/2017 1203   MCH 30.0 11/27/2017 1203   MCH 30.3 04/06/2015 1715   MCHC 35.1 11/27/2017 1203   MCHC 33.7 04/06/2015 1715   RDW 14.5 11/27/2017 1203   LYMPHSABS 2.2 11/27/2017 1203   MONOABS 0.6 04/06/2015 1715   EOSABS 0.2 11/27/2017 1203   BASOSABS 0.0 11/27/2017 1203   Iron/TIBC/Ferritin/ %Sat No results found  for: IRON, TIBC, FERRITIN, IRONPCTSAT Lipid Panel     Component Value Date/Time   CHOL 183 03/12/2018 0933   TRIG 80 03/12/2018 0933   HDL 39 (L) 03/12/2018 0933   LDLCALC 128 (H) 03/12/2018 0933   Hepatic Function Panel     Component Value Date/Time   PROT 7.8 11/27/2017 1203   ALBUMIN 4.8 11/27/2017 1203   AST 20 11/27/2017 1203   ALT 20 11/27/2017 1203   ALKPHOS 42 11/27/2017 1203   BILITOT 0.4 11/27/2017 1203      Component Value Date/Time   TSH 0.667 11/27/2017 1203   TSH 0.406  05/26/2009 1040   Results for MALARIE, TAPPEN (MRN [id number]) as of 05/06/2018 11:40  Ref. Range 11/27/2017 12:03  Vitamin D, 25-Hydroxy Latest Ref Range: 30.0 - 100.0 ng/mL 40.3   ASSESSMENT AND PLAN: Prediabetes  Vitamin D deficiency  Class 1 obesity with serious comorbidity and body mass index (BMI) of 30.0 to 30.9 in adult, unspecified obesity type - Starting BMI greater then 30  PLAN:  Pre-Diabetes Manila will continue to work on weight loss, exercise, and decreasing simple carbohydrates in her diet to help decrease the risk of diabetes. We dicussed metformin including benefits and risks. She was informed that eating too many simple carbohydrates or too many calories at one sitting increases the likelihood of GI side effects. Regla will continue metformin for now and a refill was not needed today. Shenae agreed to follow up with [name] as directed to monitor her progress.  Vitamin D Deficiency Christalyn was informed that low vitamin D levels contributes to fatigue and are associated with obesity, breast, and  colon cancer. She agrees to continue to take OTC Vit D @10 ,000 IU and will follow up for routine testing of vitamin D, at least 2-3 times per year. She was informed of the risk of over-replacement of vitamin D and agrees to not increase her dose unless she discusses this with [name] first.  We spent > than 50% of the 15 minute visit on the counseling as documented in the note.  Obesity Angelynn is currently in the action stage of change. As such, her goal is to continue with weight loss efforts She has agreed to follow the Category 2 plan Shadell has been instructed to work up to a goal of 150 minutes of combined cardio and strengthening exercise per week for weight loss and overall health benefits. We discussed the following Behavioral Modification Strategies today: planning for success, increasing lean protein intake, increasing vegetables and work on meal planning and easy cooking plans  Scherry has agreed to follow up with our clinic in 2 weeks. She was informed of the importance of frequent follow up visits to maximize her success with intensive lifestyle modifications for her multiple health conditions.   OBESITY BEHAVIORAL INTERVENTION VISIT  Today's visit was # 10 out of 22.  Starting weight: 199 lbs Starting date: 11/27/17 Today's weight : 177 lbs  Today's date: 05/06/2018 Total lbs lost to date: 69    ASK: We discussed the diagnosis of obesity with Graceann Congress today and Danisha agreed to give [name] permission to discuss obesity behavioral modification therapy today.  ASSESS: Sary has the diagnosis of obesity and her BMI today is 29.45 Taiyana is in the action stage of change   ADVISE: Gresia was educated on the multiple health risks of obesity as well as the benefit of weight loss to improve her health. She was advised of the need for long term  treatment and the importance of lifestyle modifications.  AGREE: Multiple dietary modification options and treatment options  were discussed and  Kailei agreed to the above obesity treatment plan.  I, Doreene Nest, am acting as transcriptionist for Eber Jones, MD  I have reviewed the above documentation for accuracy and completeness, and I agree with the above. - Robin Qua, MD

## 2018-05-20 ENCOUNTER — Ambulatory Visit (INDEPENDENT_AMBULATORY_CARE_PROVIDER_SITE_OTHER): Payer: 59 | Admitting: Family Medicine

## 2018-05-20 VITALS — BP 105/72 | HR 74 | Temp 98.1°F | Ht 65.0 in | Wt 175.0 lb

## 2018-05-20 DIAGNOSIS — E559 Vitamin D deficiency, unspecified: Secondary | ICD-10-CM

## 2018-05-20 DIAGNOSIS — E669 Obesity, unspecified: Secondary | ICD-10-CM | POA: Diagnosis not present

## 2018-05-20 DIAGNOSIS — E66811 Obesity, class 1: Secondary | ICD-10-CM

## 2018-05-20 DIAGNOSIS — Z9189 Other specified personal risk factors, not elsewhere classified: Secondary | ICD-10-CM | POA: Diagnosis not present

## 2018-05-20 DIAGNOSIS — Z6830 Body mass index (BMI) 30.0-30.9, adult: Secondary | ICD-10-CM | POA: Diagnosis not present

## 2018-05-20 DIAGNOSIS — R7303 Prediabetes: Secondary | ICD-10-CM

## 2018-05-20 NOTE — Progress Notes (Signed)
Office: [phone]  /  Fax: [phone]   HPI:   Chief Complaint: OBESITY Robin Knapp is here to discuss her progress with her obesity treatment plan. She is on the Category 2 plan and is following her eating plan approximately 90 % of the time. She states she is doing cardio and resistance bands 15 to 20 minutes 2 times per week. Robin Knapp has exercised only a few days, but has been consistent with exercising.  Her weight is 175 lb (79.4 kg) today and has had a weight loss of 2 pounds over a period of 2 weeks since her last visit. She has lost 24 lbs since starting treatment with [name].  Pre-Diabetes Robin Knapp has a diagnosis of prediabetes based on her elevated Hgb A1c and was informed this puts her at greater risk of developing diabetes. She is taking metformin currently and continues to work on diet and exercise to decrease risk of diabetes. She denies nausea, GI upset, or hypoglycemia with the metformin.  At risk for diabetes Robin Knapp is at higher than average risk for developing diabetes due to her prediabetes and obesity. She currently denies polyuria or polydipsia.  Vitamin D Deficiency Robin Knapp was informed that low vitamin D levels contributes to fatigue and are associated with obesity, breast, and colon cancer. She agrees to continue to take OTC Vit D supplement @10 ,000 IU daily and will follow up for routine testing of vitamin D, at least 2-3 times per year. She was informed of the risk of over-replacement of vitamin D and agrees to not increase her dose unless she discusses this with [name] first.  ALLERGIES: Allergies  Allergen Reactions  . Sulfa Antibiotics Hives    itching  . Sunscreens     MEDICATIONS: Current Outpatient Medications on File Prior to Visit  Medication Sig Dispense Refill  . acetaminophen (TYLENOL 8 HOUR ARTHRITIS PAIN) 650 MG CR tablet Take 650 mg by mouth every 8 (eight) hours as needed for pain.    [name] albuterol (PROVENTIL HFA;VENTOLIN HFA) 108 (90 Base) MCG/ACT  inhaler Inhale 2 puffs into the lungs every 6 (six) hours as needed for wheezing or shortness of breath.    . cetirizine (ZYRTEC) 10 MG tablet Take 10 mg by mouth daily.    . Cholecalciferol ([employer] VITAMIN D3) 10000 units CAPS Take by mouth.    . gabapentin (NEURONTIN) 100 MG capsule Take 3 capsules (300 mg total) by mouth at bedtime. 90 capsule 11  . ibuprofen (ADVIL,MOTRIN) 600 MG tablet Take 600 mg by mouth every 6 (six) hours as needed.    [name] levonorgestrel (MIRENA) 20 MCG/24HR IUD 1 each by Intrauterine route once.    . Magnesium 250 MG TABS Take 1 tablet by mouth daily.    . metFORMIN (GLUCOPHAGE) 500 MG tablet Take 1 tablet (500 mg total) by mouth daily with breakfast. 30 tablet 0  . montelukast (SINGULAIR) 10 MG tablet Take 10 mg by mouth at bedtime.    . Multiple Vitamins-Minerals (HAIR SKIN & NAILS ADVANCED PO) Take 1 tablet by mouth daily.    . Omega-3 Fatty Acids (OMEGA-3 FISH OIL) 1200 MG CAPS Take 2 capsules by mouth daily.    . Oral Electrolytes (REVITAL JELL CUPS PO) Take 1 capsule by mouth daily.     No current facility-administered medications on file prior to visit.     PAST MEDICAL HISTORY: Past Medical History:  Diagnosis Date  . Allergy   . Asthma   . Breast lump   . Constipation   .  Dry skin   . Fatigue   . GERD (gastroesophageal reflux disease)    pregnancy related-zantac  . HTN (hypertension)   . Hyperlipidemia   . Infertility, female   . Joint pain   . Lactose intolerance   . Missed abortion    6weeks 3days  . Muscle pain   . Obesity   . Palpitations   . PCOS (polycystic ovarian syndrome)   . Seasonal allergies   . Skin rash   . Stress   . Vitamin D deficiency     PAST SURGICAL HISTORY: Past Surgical History:  Procedure Laterality Date  . BREAST BIOPSY    . CESAREAN SECTION    . DILATION AND EVACUATION  05/09/2012   Procedure: DILATATION AND EVACUATION;  Surgeon: Marvene Staff, MD;  Location: [facility];  Service: Gynecology;  Laterality:  N/A;  . EYE SURGERY    . LASIK    . [place]    . WISDOM TOOTH EXTRACTION  1994    SOCIAL HISTORY: Social History   Tobacco Use  . Smoking status: Never Smoker  Substance Use Topics  . Alcohol use: No    Alcohol/week: 0.0 standard drinks  . Drug use: No    FAMILY HISTORY: Family History  Problem Relation Age of Onset  . Hyperlipidemia Mother   . Hyperlipidemia Father   . Hypertension Father   . Anxiety disorder Father   . Hyperlipidemia Maternal Grandmother   . Hypertension Maternal Grandmother   . Heart disease Maternal Grandmother   . Heart disease Maternal Grandfather   . Diabetes Maternal Grandfather   . Stroke Paternal Grandmother   . Mental illness Paternal Grandmother   . Heart disease Paternal Grandmother     ROS: Review of Systems  Constitutional: Positive for weight loss.  Gastrointestinal: Negative for nausea and vomiting.  Genitourinary:       Negative for polyuria.  Musculoskeletal:       Negative for muscle weakness.  Endo/Heme/Allergies: Negative for polydipsia.       Positive for carb cravings. Negative for hypoglycemia.    PHYSICAL EXAM: Blood pressure 105/72, pulse 74, temperature 98.1 F (36.7 C), temperature source Oral, height 5\' 5"  (1.651 m), weight 175 lb (79.4 kg), SpO2 98 %. Body mass index is 29.12 kg/m. Physical Exam  Constitutional: She is oriented to person, place, and time. She appears well-developed and well-nourished.  Cardiovascular: Normal rate.  Pulmonary/Chest: Effort normal.  Musculoskeletal: Normal range of motion.  Neurological: She is oriented to person, place, and time.  Skin: Skin is warm and dry.  Psychiatric: She has a normal mood and affect. Her behavior is normal.  Vitals reviewed.   RECENT LABS AND TESTS: BMET    Component Value Date/Time   NA 139 11/27/2017 1203   K 4.3 11/27/2017 1203   CL 99 11/27/2017 1203   CO2 24 11/27/2017 1203   GLUCOSE 84 11/27/2017 1203   GLUCOSE 91 04/06/2015 1715   BUN 11  11/27/2017 1203   CREATININE 0.89 11/27/2017 1203   CALCIUM 9.7 11/27/2017 1203   GFRNONAA 79 11/27/2017 1203   GFRAA 91 11/27/2017 1203   Lab Results  Component Value Date   HGBA1C 5.4 03/12/2018   HGBA1C 5.7 (H) 11/27/2017   Lab Results  Component Value Date   INSULIN 6.7 03/12/2018   INSULIN 12.2 11/27/2017   CBC    Component Value Date/Time   WBC 8.0 11/27/2017 1203   WBC 10.4 04/06/2015 1715   RBC 4.54 11/27/2017 1203  RBC 4.58 04/06/2015 1715   HGB 13.6 11/27/2017 1203   HCT 38.8 11/27/2017 1203   PLT 343 04/06/2015 1715   MCV 86 11/27/2017 1203   MCH 30.0 11/27/2017 1203   MCH 30.3 04/06/2015 1715   MCHC 35.1 11/27/2017 1203   MCHC 33.7 04/06/2015 1715   RDW 14.5 11/27/2017 1203   LYMPHSABS 2.2 11/27/2017 1203   MONOABS 0.6 04/06/2015 1715   EOSABS 0.2 11/27/2017 1203   BASOSABS 0.0 11/27/2017 1203   Iron/TIBC/Ferritin/ %Sat No results found for: IRON, TIBC, FERRITIN, IRONPCTSAT Lipid Panel     Component Value Date/Time   CHOL 183 03/12/2018 0933   TRIG 80 03/12/2018 0933   HDL 39 (L) 03/12/2018 0933   LDLCALC 128 (H) 03/12/2018 0933   Hepatic Function Panel     Component Value Date/Time   PROT 7.8 11/27/2017 1203   ALBUMIN 4.8 11/27/2017 1203   AST 20 11/27/2017 1203   ALT 20 11/27/2017 1203   ALKPHOS 42 11/27/2017 1203   BILITOT 0.4 11/27/2017 1203      Component Value Date/Time   TSH 0.667 11/27/2017 1203   TSH 0.406  05/26/2009 1040   Results for RAMON, BRANT (MRN [id number]) as of 05/20/2018 11:46  Ref. Range 11/27/2017 12:03  Vitamin D, 25-Hydroxy Latest Ref Range: 30.0 - 100.0 ng/mL 40.3   ASSESSMENT AND PLAN: Prediabetes - Plan: metFORMIN (GLUCOPHAGE) 500 MG tablet  Vitamin D deficiency  At risk for diabetes mellitus  Class 1 obesity with serious comorbidity and body mass index (BMI) of 30.0 to 30.9 in adult, unspecified obesity type - Starting BMI greater then 30  PLAN:  Pre-Diabetes Robin Knapp will continue to work on  weight loss, exercise, and decreasing simple carbohydrates in her diet to help decrease the risk of diabetes. We dicussed metformin including benefits and risks. She was informed that eating too many simple carbohydrates or too many calories at one sitting increases the likelihood of GI side effects. Robin Knapp is taking metformin 500mg  PO qAM #30 with no refills a prescription was written today. Robin Knapp agreed to follow up with [name] in 2 weeks.  Diabetes risk counseling Robin Knapp was given extended (15 minutes) diabetes prevention counseling today. She is 47 y.o. female and has risk factors for diabetes including prediabetes and obesity. We discussed intensive lifestyle modifications today with an emphasis on weight loss as well as increasing exercise and decreasing simple carbohydrates in her diet.  Vitamin D Deficiency Robin Knapp was informed that low vitamin D levels contributes to fatigue and are associated with obesity, breast, and colon cancer. She agrees to continue to take OTC Vit D @10 ,000 IU daily and will follow up for routine testing of vitamin D, at least 2-3 times per year. She was informed of the risk of over-replacement of vitamin D and agrees to not increase her dose unless she discusses this with [name] first.  Obesity Robin Knapp is currently in the action stage of change. As such, her goal is to continue with weight loss efforts. She has agreed to follow the Category 2 plan. Anni has been instructed to work up to a goal of 150 minutes of combined cardio and strengthening exercise per week for weight loss and overall health benefits. We discussed the following Behavioral Modification Strategies today: increasing lean protein intake, increasing vegetables, work on meal planning and easy cooking plans, and planning for success.  Robin Knapp has agreed to follow up with our clinic in 2 weeks. She was informed of the importance of frequent follow up  visits to maximize her success with intensive lifestyle  modifications for her multiple health conditions.   OBESITY BEHAVIORAL INTERVENTION VISIT  Today's visit was # 11  Starting weight: 199 lbs Starting date: 11/27/17 Today's weight : Weight: 175 lb (79.4 kg)  Today's date: 05/20/2018 Total lbs lost to date: 24 At least 15 minutes were spent on discussing the following behavioral intervention visit.   ASK: We discussed the diagnosis of obesity with Robin Knapp today and Robin Knapp agreed to give [name] permission to discuss obesity behavioral modification therapy today.  ASSESS: Zamariya has the diagnosis of obesity and her BMI today is 29.12. Jesselle is in the action stage of change.   ADVISE: Robin Knapp was educated on the multiple health risks of obesity as well as the benefit of weight loss to improve her health. Shewas advised of the need for long term treatment and the importance of lifestyle modifications to improve her current health and to decrease her risk of future health problems.  AGREE: Multiple dietary modification options and treatment options were discussed and Adaria agreed to follow the recommendations documented in the above note.  ARRANGE: Satoya was educated on the importance of frequent visits to treat obesity as outlined per CMS and USPSTF guidelines and agreed to schedule her next follow up appointment today.  I, Marcille Blanco, am acting as Location manager for Eber Jones, MD  I have reviewed the above documentation for accuracy and completeness, and I agree with the above. - Ilene Qua, MD

## 2018-05-21 MED ORDER — METFORMIN HCL 500 MG PO TABS: 500.0000 mg | ORAL_TABLET | Freq: Every day | ORAL | 0 refills | Status: DC

## 2018-05-29 DIAGNOSIS — N6320 Unspecified lump in the left breast, unspecified quadrant: Secondary | ICD-10-CM | POA: Diagnosis not present

## 2018-05-29 DIAGNOSIS — N6310 Unspecified lump in the right breast, unspecified quadrant: Secondary | ICD-10-CM | POA: Diagnosis not present

## 2018-06-03 ENCOUNTER — Ambulatory Visit (INDEPENDENT_AMBULATORY_CARE_PROVIDER_SITE_OTHER): Payer: 59 | Admitting: Family Medicine

## 2018-06-03 VITALS — BP 102/69 | HR 97 | Temp 97.6°F | Ht 65.0 in | Wt 178.0 lb

## 2018-06-03 DIAGNOSIS — E669 Obesity, unspecified: Secondary | ICD-10-CM

## 2018-06-03 DIAGNOSIS — R7303 Prediabetes: Secondary | ICD-10-CM

## 2018-06-03 DIAGNOSIS — E559 Vitamin D deficiency, unspecified: Secondary | ICD-10-CM

## 2018-06-03 DIAGNOSIS — Z6830 Body mass index (BMI) 30.0-30.9, adult: Secondary | ICD-10-CM | POA: Diagnosis not present

## 2018-06-04 MED FILL — metFORMIN HCL 500 MG TABS: 500 | 30 days supply | Qty: 30 | Fill #0

## 2018-06-04 NOTE — Progress Notes (Signed)
Office: [phone]  /  Fax: [phone]   HPI:   Chief Complaint: OBESITY Robin Knapp is here to discuss her progress with her obesity treatment plan. She is on the Category 2 plan and is following her eating plan approximately 50 % of the time. She states she is doing dance cardio and using resistance bands for 20 minutes 3 times per week. Robin Knapp is struggling with motivation. She is doing well with breakfast and slowly derailing throughout the rest of the day.  Her weight is 178 lb (80.7 kg) today and has gained 3 pounds since her last visit. She has lost 21 lbs since starting treatment with [name].  Pre-Diabetes Robin Knapp has a diagnosis of pre-diabetes based on her elevated Hgb A1c and was informed this puts her at greater risk of developing diabetes. She denies GI upset with metformin and no carbohydrate cravings. She continues to work on diet and exercise to decrease risk of diabetes. She denies hypoglycemia.  Vitamin D Deficiency Robin Knapp has a diagnosis of vitamin D deficiency. She is currently taking OTC Vit D supplement and denies nausea, vomiting or muscle weakness.  ALLERGIES: Allergies  Allergen Reactions  . Sulfa Antibiotics Hives    itching  . Sunscreens     MEDICATIONS: Current Outpatient Medications on File Prior to Visit  Medication Sig Dispense Refill  . acetaminophen (TYLENOL 8 HOUR ARTHRITIS PAIN) 650 MG CR tablet Take 650 mg by mouth every 8 (eight) hours as needed for pain.    [name] albuterol (PROVENTIL HFA;VENTOLIN HFA) 108 (90 Base) MCG/ACT inhaler Inhale 2 puffs into the lungs every 6 (six) hours as needed for wheezing or shortness of breath.    . cetirizine (ZYRTEC) 10 MG tablet Take 10 mg by mouth daily.    . Cholecalciferol ([employer]S VITAMIN D3) 10000 units CAPS Take by mouth.    . gabapentin (NEURONTIN) 100 MG capsule Take 3 capsules (300 mg total) by mouth at bedtime. 90 capsule 11  . ibuprofen (ADVIL,MOTRIN) 600 MG tablet Take 600 mg by mouth every 6 (six) hours as  needed.    [name] levonorgestrel (MIRENA) 20 MCG/24HR IUD 1 each by Intrauterine route once.    . Magnesium 250 MG TABS Take 1 tablet by mouth daily.    . metFORMIN (GLUCOPHAGE) 500 MG tablet Take 1 tablet (500 mg total) by mouth daily with breakfast. 30 tablet 0  . montelukast (SINGULAIR) 10 MG tablet Take 10 mg by mouth at bedtime.    . Multiple Vitamins-Minerals (HAIR SKIN & NAILS ADVANCED PO) Take 1 tablet by mouth daily.    . Omega-3 Fatty Acids (OMEGA-3 FISH OIL) 1200 MG CAPS Take 2 capsules by mouth daily.    . Oral Electrolytes (REVITAL JELL CUPS PO) Take 1 capsule by mouth daily.     No current facility-administered medications on file prior to visit.     PAST MEDICAL HISTORY: Past Medical History:  Diagnosis Date  . Allergy   . Asthma   . Breast lump   . Constipation   . Dry skin   . Fatigue   . GERD (gastroesophageal reflux disease)    pregnancy related-zantac  . HTN (hypertension)   . Hyperlipidemia   . Infertility, female   . Joint pain   . Lactose intolerance   . Missed abortion    6weeks 3days  . Muscle pain   . Obesity   . Palpitations   . PCOS (polycystic ovarian syndrome)   . Seasonal allergies   . Skin rash   .  Stress   . Vitamin D deficiency     PAST SURGICAL HISTORY: Past Surgical History:  Procedure Laterality Date  . BREAST BIOPSY    . CESAREAN SECTION    . DILATION AND EVACUATION  05/09/2012   Procedure: DILATATION AND EVACUATION;  Surgeon: Marvene Staff, MD;  Location: [facility];  Service: Gynecology;  Laterality: N/A;  . EYE SURGERY    . LASIK    . [place]    . WISDOM TOOTH EXTRACTION  1994    SOCIAL HISTORY: Social History   Tobacco Use  . Smoking status: Never Smoker  Substance Use Topics  . Alcohol use: No    Alcohol/week: 0.0 standard drinks  . Drug use: No    FAMILY HISTORY: Family History  Problem Relation Age of Onset  . Hyperlipidemia Mother   . Hyperlipidemia Father   . Hypertension Father   . Anxiety disorder Father     . Hyperlipidemia Maternal Grandmother   . Hypertension Maternal Grandmother   . Heart disease Maternal Grandmother   . Heart disease Maternal Grandfather   . Diabetes Maternal Grandfather   . Stroke Paternal Grandmother   . Mental illness Paternal Grandmother   . Heart disease Paternal Grandmother     ROS: Review of Systems  Constitutional: Negative for weight loss.  Gastrointestinal: Negative for nausea and vomiting.  Musculoskeletal:       Negative muscle weakness  Endo/Heme/Allergies:       Negative hypoglycemia    PHYSICAL EXAM: Blood pressure 102/69, pulse 97, temperature 97.6 F (36.4 C), temperature source Oral, height 5\' 5"  (1.651 m), weight 178 lb (80.7 kg), SpO2 96 %. Body mass index is 29.62 kg/m. Physical Exam  Constitutional: She is oriented to person, place, and time. She appears well-developed and well-nourished.  Cardiovascular: Normal rate.  Pulmonary/Chest: Effort normal.  Musculoskeletal: Normal range of motion.  Neurological: She is oriented to person, place, and time.  Skin: Skin is warm and dry.  Psychiatric: She has a normal mood and affect. Her behavior is normal.  Vitals reviewed.   RECENT LABS AND TESTS: BMET    Component Value Date/Time   NA 139 11/27/2017 1203   K 4.3 11/27/2017 1203   CL 99 11/27/2017 1203   CO2 24 11/27/2017 1203   GLUCOSE 84 11/27/2017 1203   GLUCOSE 91 04/06/2015 1715   BUN 11 11/27/2017 1203   CREATININE 0.89 11/27/2017 1203   CALCIUM 9.7 11/27/2017 1203   GFRNONAA 79 11/27/2017 1203   GFRAA 91 11/27/2017 1203   Lab Results  Component Value Date   HGBA1C 5.4 03/12/2018   HGBA1C 5.7 (H) 11/27/2017   Lab Results  Component Value Date   INSULIN 6.7 03/12/2018   INSULIN 12.2 11/27/2017   CBC    Component Value Date/Time   WBC 8.0 11/27/2017 1203   WBC 10.4 04/06/2015 1715   RBC 4.54 11/27/2017 1203   RBC 4.58 04/06/2015 1715   HGB 13.6 11/27/2017 1203   HCT 38.8 11/27/2017 1203   PLT 343 04/06/2015  1715   MCV 86 11/27/2017 1203   MCH 30.0 11/27/2017 1203   MCH 30.3 04/06/2015 1715   MCHC 35.1 11/27/2017 1203   MCHC 33.7 04/06/2015 1715   RDW 14.5 11/27/2017 1203   LYMPHSABS 2.2 11/27/2017 1203   MONOABS 0.6 04/06/2015 1715   EOSABS 0.2 11/27/2017 1203   BASOSABS 0.0 11/27/2017 1203   Iron/TIBC/Ferritin/ %Sat No results found for: IRON, TIBC, FERRITIN, IRONPCTSAT Lipid Panel     Component  Value Date/Time   CHOL 183 03/12/2018 0933   TRIG 80 03/12/2018 0933   HDL 39 (L) 03/12/2018 0933   LDLCALC 128 (H) 03/12/2018 0933   Hepatic Function Panel     Component Value Date/Time   PROT 7.8 11/27/2017 1203   ALBUMIN 4.8 11/27/2017 1203   AST 20 11/27/2017 1203   ALT 20 11/27/2017 1203   ALKPHOS 42 11/27/2017 1203   BILITOT 0.4 11/27/2017 1203      Component Value Date/Time   TSH 0.667 11/27/2017 1203   TSH 0.406  05/26/2009 1040  Results for MALAYSIA, CRANCE (MRN [id number]) as of 06/04/2018 11:21  Ref. Range 11/27/2017 12:03  Vitamin D, 25-Hydroxy Latest Ref Range: 30.0 - 100.0 ng/mL 40.3    ASSESSMENT AND PLAN: Prediabetes  Vitamin D deficiency  Class 1 obesity with serious comorbidity and body mass index (BMI) of 30.0 to 30.9 in adult, unspecified obesity type - Beggining BMI >30  PLAN:  Pre-Diabetes Robin Knapp will continue to work on weight loss, exercise, and decreasing simple carbohydrates in her diet to help decrease the risk of diabetes. We dicussed metformin including benefits and risks. She was informed that eating too many simple carbohydrates or too many calories at one sitting increases the likelihood of GI side effects. Robin Knapp agrees to continue taking metformin, no refill needed, and she agrees to follow up with our clinic in 2 weeks as directed to monitor her progress.  Vitamin D Deficiency Robin Knapp was informed that low vitamin D levels contributes to fatigue and are associated with obesity, breast, and colon cancer. Robin Knapp agrees to continue taking  OTC Vit D supplement and will follow up for routine testing of vitamin D, at least 2-3 times per year. She was informed of the risk of over-replacement of vitamin D and agrees to not increase her dose unless she discusses this with [name] first. Robin Knapp agrees to follow up with our clinic in 2 weeks.  I spent > than 50% of the 15 minute visit on counseling as documented in the note.  Obesity Robin Knapp is currently in the action stage of change. As such, her goal is to continue with weight loss efforts She has agreed to keep a food journal with 1150-1300 calories and 85+ grams of protein daily or follow the Category 2 plan Robin Knapp has been instructed to work up to a goal of 150 minutes of combined cardio and strengthening exercise per week for weight loss and overall health benefits. We discussed the following Behavioral Modification Strategies today: increasing lean protein intake, increasing vegetables, work on meal planning and easy cooking plans, and planning for success   Robin Knapp has agreed to follow up with our clinic in 2 weeks. She was informed of the importance of frequent follow up visits to maximize her success with intensive lifestyle modifications for her multiple health conditions.   OBESITY BEHAVIORAL INTERVENTION VISIT  Today's visit was # 12   Starting weight: 199 lbs Starting date: 11/27/17 Today's weight : 178 lbs  Today's date: 06/03/2018 Total lbs lost to date: 21    ASK: We discussed the diagnosis of obesity with Robin Knapp [place] today and Robin Knapp agreed to give [name] permission to discuss obesity behavioral modification therapy today.  ASSESS: Robin Knapp has the diagnosis of obesity and her BMI today is 29.62 Robin Knapp is in the action stage of change   ADVISE: Robin Knapp was educated on the multiple health risks of obesity as well as the benefit of weight loss to improve her health. She  was advised of the need for long term treatment and the importance of lifestyle  modifications to improve her current health and to decrease her risk of future health problems.  AGREE: Multiple dietary modification options and treatment options were discussed and  Robin Knapp agreed to follow the recommendations documented in the above note.  ARRANGE: Robin Knapp was educated on the importance of frequent visits to treat obesity as outlined per CMS and USPSTF guidelines and agreed to schedule her next follow up appointment today.  I, Trixie Dredge, am acting as transcriptionist for Ilene Qua, MD  I have reviewed the above documentation for accuracy and completeness, and I agree with the above. - Ilene Qua, MD

## 2018-06-17 ENCOUNTER — Ambulatory Visit (INDEPENDENT_AMBULATORY_CARE_PROVIDER_SITE_OTHER): Payer: 59 | Admitting: Family Medicine

## 2018-06-23 DIAGNOSIS — G5603 Carpal tunnel syndrome, bilateral upper limbs: Secondary | ICD-10-CM | POA: Diagnosis not present

## 2018-06-23 DIAGNOSIS — R030 Elevated blood-pressure reading, without diagnosis of hypertension: Secondary | ICD-10-CM | POA: Diagnosis not present

## 2018-06-23 DIAGNOSIS — E559 Vitamin D deficiency, unspecified: Secondary | ICD-10-CM | POA: Diagnosis not present

## 2018-06-23 DIAGNOSIS — E282 Polycystic ovarian syndrome: Secondary | ICD-10-CM | POA: Diagnosis not present

## 2018-06-23 DIAGNOSIS — E7800 Pure hypercholesterolemia, unspecified: Secondary | ICD-10-CM | POA: Diagnosis not present

## 2018-06-24 ENCOUNTER — Ambulatory Visit (INDEPENDENT_AMBULATORY_CARE_PROVIDER_SITE_OTHER): Payer: 59 | Admitting: Bariatrics

## 2018-06-30 ENCOUNTER — Ambulatory Visit (INDEPENDENT_AMBULATORY_CARE_PROVIDER_SITE_OTHER): Payer: 59 | Admitting: Family Medicine

## 2018-06-30 VITALS — BP 114/80 | HR 98 | Temp 98.2°F | Ht 65.0 in | Wt 178.0 lb

## 2018-06-30 DIAGNOSIS — Z6830 Body mass index (BMI) 30.0-30.9, adult: Secondary | ICD-10-CM | POA: Diagnosis not present

## 2018-06-30 DIAGNOSIS — E7849 Other hyperlipidemia: Secondary | ICD-10-CM

## 2018-06-30 DIAGNOSIS — R7303 Prediabetes: Secondary | ICD-10-CM | POA: Diagnosis not present

## 2018-06-30 DIAGNOSIS — Z9189 Other specified personal risk factors, not elsewhere classified: Secondary | ICD-10-CM

## 2018-06-30 DIAGNOSIS — E669 Obesity, unspecified: Secondary | ICD-10-CM | POA: Diagnosis not present

## 2018-06-30 MED ORDER — METFORMIN HCL 500 MG PO TABS: 500.0000 mg | ORAL_TABLET | Freq: Every day | ORAL | 0 refills | Status: DC

## 2018-06-30 MED FILL — metFORMIN HCL 500 MG TABS: 500 | 30 days supply | Qty: 30 | Fill #0

## 2018-07-01 NOTE — Progress Notes (Signed)
Office: [phone]  /  Fax: [phone]   HPI:   Chief Complaint: OBESITY Robin Knapp is here to discuss her progress with her obesity treatment plan. She is on the  keep a food journal with 1150-1300 calories and 85+g of  protein  and follow the Category 2 plan and is following her eating plan approximately 60 % of the time. She states she is exercising 0 minutes 0 times per week. 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 She reports stress eating and ordering out more. She is noticing no fulfillment and abdominal pain. Doing more of a structure.   Her weight is 178 lb (80.7 kg) today and has not lost weight since her last visit. She has lost 21 lbs since starting treatment with [name].  Pre-Diabetes Robin Knapp has a diagnosis of prediabetes based on her elevated HgA1c and was informed this puts her at greater risk of developing diabetes. She is taking metformin currently and continues to work on diet and exercise to decrease risk of diabetes. She denies nausea or hypoglycemia. She admits to occasional GI upset with carbohydrate cravings.   Hyperlipidemia Robin Knapp has hyperlipidemia and her LDL is still elevated. She has been trying to improve her cholesterol levels with intensive lifestyle modification including a low saturated fat diet, exercise and weight loss. She denies any chest pain, claudication or myalgias. Not currently on any medications.   At risk for diabetes Robin Knapp is at higher than averagerisk for developing diabetes due to her obesity. She currently denies polyuria or polydipsia.  ALLERGIES: Allergies  Allergen Reactions  . Sulfa Antibiotics Hives    itching  . Sunscreens     MEDICATIONS: Current Outpatient Medications on File Prior to Visit  Medication Sig Dispense Refill  . acetaminophen (TYLENOL 8 HOUR ARTHRITIS PAIN) 650 MG CR tablet Take 650 mg by mouth every 8 (eight) hours as needed for pain.    [name] albuterol (PROVENTIL HFA;VENTOLIN  HFA) 108 (90 Base) MCG/ACT inhaler Inhale 2 puffs into the lungs every 6 (six) hours as needed for wheezing or shortness of breath.    . cetirizine (ZYRTEC) 10 MG tablet Take 10 mg by mouth daily.    . Cholecalciferol ([employer] VITAMIN D3) 10000 units CAPS Take by mouth.    . gabapentin (NEURONTIN) 100 MG capsule Take 3 capsules (300 mg total) by mouth at bedtime. 90 capsule 11  . ibuprofen (ADVIL,MOTRIN) 600 MG tablet Take 600 mg by mouth every 6 (six) hours as needed.    [name] levonorgestrel (MIRENA) 20 MCG/24HR IUD 1 each by Intrauterine route once.    . Magnesium 250 MG TABS Take 1 tablet by mouth daily.    . montelukast (SINGULAIR) 10 MG tablet Take 10 mg by mouth at bedtime.    . Multiple Vitamins-Minerals (HAIR SKIN & NAILS ADVANCED PO) Take 1 tablet by mouth daily.    . Omega-3 Fatty Acids (OMEGA-3 FISH OIL) 1200 MG CAPS Take 2 capsules by mouth daily.    . Oral Electrolytes (REVITAL JELL CUPS PO) Take 1 capsule by mouth daily.     No current facility-administered medications on file prior to visit.     PAST MEDICAL HISTORY: Past Medical History:  Diagnosis Date  . Allergy   . Asthma   . Breast lump   . Constipation   . Dry skin   . Fatigue   . GERD (gastroesophageal reflux disease)    pregnancy related-zantac  . HTN (hypertension)   . Hyperlipidemia   .  Infertility, female   . Joint pain   . Lactose intolerance   . Missed abortion    6weeks 3days  . Muscle pain   . Obesity   . Palpitations   . PCOS (polycystic ovarian syndrome)   . Seasonal allergies   . Skin rash   . Stress   . Vitamin D deficiency     PAST SURGICAL HISTORY: Past Surgical History:  Procedure Laterality Date  . BREAST BIOPSY    . CESAREAN SECTION    . DILATION AND EVACUATION  05/09/2012   Procedure: DILATATION AND EVACUATION;  Surgeon: Marvene Staff, MD;  Location: [facility];  Service: Gynecology;  Laterality: N/A;  . EYE SURGERY    . LASIK    . [place]    . WISDOM TOOTH EXTRACTION  1994     SOCIAL HISTORY: Social History   Tobacco Use  . Smoking status: Never Smoker  Substance Use Topics  . Alcohol use: No    Alcohol/week: 0.0 standard drinks  . Drug use: No    FAMILY HISTORY: Family History  Problem Relation Age of Onset  . Hyperlipidemia Mother   . Hyperlipidemia Father   . Hypertension Father   . Anxiety disorder Father   . Hyperlipidemia Maternal Grandmother   . Hypertension Maternal Grandmother   . Heart disease Maternal Grandmother   . Heart disease Maternal Grandfather   . Diabetes Maternal Grandfather   . Stroke Paternal Grandmother   . Mental illness Paternal Grandmother   . Heart disease Paternal Grandmother     ROS: Review of Systems  Constitutional: Negative for weight loss.  Cardiovascular: Negative for chest pain and claudication.  Gastrointestinal: Negative for nausea.  Musculoskeletal: Negative for myalgias.  Endo/Heme/Allergies: Negative for polydipsia.       Negative for hypoglycemia and polyuria     PHYSICAL EXAM: Blood pressure 114/80, pulse 98, temperature 98.2 F (36.8 C), temperature source Oral, height 5\' 5"  (1.651 m), weight 178 lb (80.7 kg), SpO2 98 %. Body mass index is 29.62 kg/m. Physical Exam  Constitutional: She is oriented to person, place, and time. She appears well-developed and well-nourished.  HENT:  Head: Normocephalic.  Neck: Normal range of motion.  Cardiovascular: Normal rate.  Pulmonary/Chest: Effort normal.  Musculoskeletal: Normal range of motion.  Neurological: She is alert and oriented to person, place, and time.  Skin: Skin is warm and dry.  Psychiatric: She has a normal mood and affect. Her behavior is normal.  Vitals reviewed.   RECENT LABS AND TESTS: BMET    Component Value Date/Time   NA 139 11/27/2017 1203   K 4.3 11/27/2017 1203   CL 99 11/27/2017 1203   CO2 24 11/27/2017 1203   GLUCOSE 84 11/27/2017 1203   GLUCOSE 91 04/06/2015 1715   BUN 11 11/27/2017 1203   CREATININE 0.89  11/27/2017 1203   CALCIUM 9.7 11/27/2017 1203   GFRNONAA 79 11/27/2017 1203   GFRAA 91 11/27/2017 1203   Lab Results  Component Value Date   HGBA1C 5.4 03/12/2018   HGBA1C 5.7 (H) 11/27/2017   Lab Results  Component Value Date   INSULIN 6.7 03/12/2018   INSULIN 12.2 11/27/2017   CBC    Component Value Date/Time   WBC 8.0 11/27/2017 1203   WBC 10.4 04/06/2015 1715   RBC 4.54 11/27/2017 1203   RBC 4.58 04/06/2015 1715   HGB 13.6 11/27/2017 1203   HCT 38.8 11/27/2017 1203   PLT 343 04/06/2015 1715   MCV 86 11/27/2017 1203  MCH 30.0 11/27/2017 1203   MCH 30.3 04/06/2015 1715   MCHC 35.1 11/27/2017 1203   MCHC 33.7 04/06/2015 1715   RDW 14.5 11/27/2017 1203   LYMPHSABS 2.2 11/27/2017 1203   MONOABS 0.6 04/06/2015 1715   EOSABS 0.2 11/27/2017 1203   BASOSABS 0.0 11/27/2017 1203   Iron/TIBC/Ferritin/ %Sat No results found for: IRON, TIBC, FERRITIN, IRONPCTSAT Lipid Panel     Component Value Date/Time   CHOL 183 03/12/2018 0933   TRIG 80 03/12/2018 0933   HDL 39 (L) 03/12/2018 0933   LDLCALC 128 (H) 03/12/2018 0933   Hepatic Function Panel     Component Value Date/Time   PROT 7.8 11/27/2017 1203   ALBUMIN 4.8 11/27/2017 1203   AST 20 11/27/2017 1203   ALT 20 11/27/2017 1203   ALKPHOS 42 11/27/2017 1203   BILITOT 0.4 11/27/2017 1203      Component Value Date/Time   TSH 0.667 11/27/2017 1203   TSH 0.406  05/26/2009 1040    ASSESSMENT AND PLAN: Prediabetes - Plan: metFORMIN (GLUCOPHAGE) 500 MG tablet  Other hyperlipidemia  At risk for diabetes mellitus  Class 1 obesity with serious comorbidity and body mass index (BMI) of 30.0 to 30.9 in adult, unspecified obesity type - beginning BMI >30  PLAN: Pre-Diabetes Robin Knapp will continue to work on weight loss, exercise, and decreasing simple carbohydrates in her diet to help decrease the risk of diabetes. We dicussed metformin including benefits and risks. She was informed that eating too many simple  carbohydrates or too many calories at one sitting increases the likelihood of GI side effects. Robin Knapp agreed to continue Metformin 500 mg daily #30 with no refills. Robin Knapp agreed to follow up with [name] as directed to monitor her progress.  Hyperlipidemia Robin Knapp was informed of the American Heart Association Guidelines emphasizing intensive lifestyle modifications as the first line treatment for hyperlipidemia. We discussed many lifestyle modifications today in depth, and Robin Knapp will continue to work on decreasing saturated fats such as fatty red meat, butter and many fried foods. She will also increase vegetables and lean protein in her diet and continue to work on exercise and weight loss efforts. We will repeat labs at next visit. Agrees to follow up with our clinic as directed.   Diabetes risk counseling Robin Knapp was given extended (15 minutes) diabetes prevention counseling today. She is 47 y.o. female and has risk factors for diabetes including obesity. We discussed intensive lifestyle modifications today with an emphasis on weight loss as well as increasing exercise and decreasing simple carbohydrates in her diet.  Obesity Robin Knapp is currently in the action stage of change. As such, her goal is to continue with weight loss efforts She has agreed to keep a food journal with 1150-1300 calories and 85+ g of protein   Discussed working on increasing cognitive with all eating.  Robin Knapp has been instructed to work up to a goal of 150 minutes of combined cardio and strengthening exercise per week for weight loss and overall health benefits. We discussed the following Behavioral Modification Strategies today: increasing lean protein intake, increasing vegetables, planning for success, and work on meal planning and easy cooking plans   Robin Knapp has agreed to follow up with our clinic in 2 weeks. She was informed of the importance of frequent follow up visits to maximize her success with intensive  lifestyle modifications for her multiple health conditions.   OBESITY BEHAVIORAL INTERVENTION VISIT  Today's visit was # 13   Starting weight: 199 lb Starting date: 11/27/17  Today's weight : 178 lb Today's date: 06/30/18 Total lbs lost to date: 21 lb    ASK: We discussed the diagnosis of obesity with Robin Knapp today and Robin Knapp agreed to give [name] permission to discuss obesity behavioral modification therapy today.  ASSESS: Robin Knapp has the diagnosis of obesity and her BMI today is 29.62 Robin Knapp is in the action stage of change   ADVISE: Robin Knapp was educated on the multiple health risks of obesity as well as the benefit of weight loss to improve her health. She was advised of the need for long term treatment and the importance of lifestyle modifications to improve her current health and to decrease her risk of future health problems.  AGREE: Multiple dietary modification options and treatment options were discussed and  Robin Knapp agreed to follow the recommendations documented in the above note.  ARRANGE: Mazi was educated on the importance of frequent visits to treat obesity as outlined per CMS and USPSTF guidelines and agreed to schedule her next follow up appointment today.  I, Renee Ramus, am acting as transcriptionist for Ilene Qua, MD   I have reviewed the above documentation for accuracy and completeness, and I agree with the above. - Ilene Qua, MD

## 2018-07-16 ENCOUNTER — Ambulatory Visit (INDEPENDENT_AMBULATORY_CARE_PROVIDER_SITE_OTHER): Payer: 59 | Admitting: Family Medicine

## 2018-07-16 VITALS — BP 103/72 | HR 68 | Temp 98.4°F | Ht 65.0 in | Wt 178.0 lb

## 2018-07-16 DIAGNOSIS — Z6830 Body mass index (BMI) 30.0-30.9, adult: Secondary | ICD-10-CM | POA: Diagnosis not present

## 2018-07-16 DIAGNOSIS — Z9189 Other specified personal risk factors, not elsewhere classified: Secondary | ICD-10-CM | POA: Diagnosis not present

## 2018-07-16 DIAGNOSIS — R7303 Prediabetes: Secondary | ICD-10-CM

## 2018-07-16 DIAGNOSIS — E669 Obesity, unspecified: Secondary | ICD-10-CM

## 2018-07-16 DIAGNOSIS — E559 Vitamin D deficiency, unspecified: Secondary | ICD-10-CM

## 2018-07-16 NOTE — Progress Notes (Signed)
Office: [phone]  /  Fax: [phone]   HPI:   Chief Complaint: OBESITY Robin Knapp is here to discuss her progress with her obesity treatment plan. She is following the Category 2 plan and is following her eating plan approximately 60 % of the time. She states she is walking 20-30 minutes 3 times per week. Robin Knapp has had a significant amount of stress with life at at work and hasn't planned for foods. She has a birthday coming up and is planning on indulging.   Her weight is 178 lb (80.7 kg) today and she has maintained since her last visit. She has lost 21 lbs since starting treatment with [name].  Pre-Diabetes Robin Knapp has a diagnosis of prediabetes based on her elevated HgA1c and was informed this puts her at greater risk of developing diabetes. She is stable on metformin and continues to work on diet and exercise to decrease risk of diabetes. She denies any GI symptoms, nausea or hypoglycemia.  At risk for diabetes Robin Knapp is at higher than averagerisk for developing diabetes due to her obesity. She currently denies polyuria or polydipsia.  Vitamin D deficiency Robin Knapp has a diagnosis of vitamin D deficiency and she does complain of some fatigue. She is not currently taking vit D and denies nausea, vomiting or muscle weakness.    ALLERGIES: Allergies  Allergen Reactions  . Sulfa Antibiotics Hives    itching  . Sunscreens     MEDICATIONS: Current Outpatient Medications on File Prior to Visit  Medication Sig Dispense Refill  . acetaminophen (TYLENOL 8 HOUR ARTHRITIS PAIN) 650 MG CR tablet Take 650 mg by mouth every 8 (eight) hours as needed for pain.    Robin Knapp Kitchen albuterol (PROVENTIL HFA;VENTOLIN HFA) 108 (90 Base) MCG/ACT inhaler Inhale 2 puffs into the lungs every 6 (six) hours as needed for wheezing or shortness of breath.    . cetirizine (ZYRTEC) 10 MG tablet Take 10 mg by mouth daily.    . Cholecalciferol ([employer]S VITAMIN D3) 10000 units CAPS Take by mouth.    . gabapentin  (NEURONTIN) 100 MG capsule Take 3 capsules (300 mg total) by mouth at bedtime. 90 capsule 11  . ibuprofen (ADVIL,MOTRIN) 600 MG tablet Take 600 mg by mouth every 6 (six) hours as needed.    Robin Knapp Kitchen levonorgestrel (MIRENA) 20 MCG/24HR IUD 1 each by Intrauterine route once.    . Magnesium 250 MG TABS Take 1 tablet by mouth daily.    . metFORMIN (GLUCOPHAGE) 500 MG tablet Take 1 tablet (500 mg total) by mouth daily with breakfast. 30 tablet 0  . montelukast (SINGULAIR) 10 MG tablet Take 10 mg by mouth at bedtime.    . Multiple Vitamins-Minerals (HAIR SKIN & NAILS ADVANCED PO) Take 1 tablet by mouth daily.    . Omega-3 Fatty Acids (OMEGA-3 FISH OIL) 1200 MG CAPS Take 2 capsules by mouth daily.    . Oral Electrolytes (REVITAL JELL CUPS PO) Take 1 capsule by mouth daily.     No current facility-administered medications on file prior to visit.     PAST MEDICAL HISTORY: Past Medical History:  Diagnosis Date  . Allergy   . Asthma   . Breast lump   . Constipation   . Dry skin   . Fatigue   . GERD (gastroesophageal reflux disease)    pregnancy related-zantac  . HTN (hypertension)   . Hyperlipidemia   . Infertility, female   . Joint pain   . Lactose intolerance   . Missed abortion  6weeks 3days  . Muscle pain   . Obesity   . Palpitations   . PCOS (polycystic ovarian syndrome)   . Seasonal allergies   . Skin rash   . Stress   . Vitamin D deficiency     PAST SURGICAL HISTORY: Past Surgical History:  Procedure Laterality Date  . BREAST BIOPSY    . CESAREAN SECTION    . DILATION AND EVACUATION  05/09/2012   Procedure: DILATATION AND EVACUATION;  Surgeon: Robin Staff, MD;  Location: [facility];  Service: Gynecology;  Laterality: N/A;  . EYE SURGERY    . LASIK    . [place]    . WISDOM TOOTH EXTRACTION  1994    SOCIAL HISTORY: Social History   Tobacco Use  . Smoking status: Never Smoker  Substance Use Topics  . Alcohol use: No    Alcohol/week: 0.0 standard drinks  . Drug use:  No    FAMILY HISTORY: Family History  Problem Relation Age of Onset  . Hyperlipidemia Mother   . Hyperlipidemia Father   . Hypertension Father   . Anxiety disorder Father   . Hyperlipidemia Maternal Grandmother   . Hypertension Maternal Grandmother   . Heart disease Maternal Grandmother   . Heart disease Maternal Grandfather   . Diabetes Maternal Grandfather   . Stroke Paternal Grandmother   . Mental illness Paternal Grandmother   . Heart disease Paternal Grandmother     ROS: Review of Systems  Constitutional: Positive for malaise/fatigue. Negative for weight loss.  Gastrointestinal: Negative for diarrhea, nausea and vomiting.  Musculoskeletal:       Negative muscle weakness  Endo/Heme/Allergies: Negative for polydipsia.       Negative polyuria Negative polyphasia Negative hypoglycemia    PHYSICAL EXAM: Blood pressure 103/72, pulse 68, temperature 98.4 F (36.9 C), temperature source Oral, height 5\' 5"  (1.651 m), weight 178 lb (80.7 kg), SpO2 99 %. Body mass index is 29.62 kg/m. Physical Exam  Constitutional: She is oriented to person, place, and time. She appears well-developed and well-nourished.  Cardiovascular: Normal rate.  Pulmonary/Chest: Effort normal.  Musculoskeletal: Normal range of motion.  Neurological: She is alert and oriented to person, place, and time.  Skin: Skin is warm and dry.  Psychiatric: She has a normal mood and affect. Her behavior is normal.  Vitals reviewed.   RECENT LABS AND TESTS: BMET    Component Value Date/Time   NA 139 11/27/2017 1203   K 4.3 11/27/2017 1203   CL 99 11/27/2017 1203   CO2 24 11/27/2017 1203   GLUCOSE 84 11/27/2017 1203   GLUCOSE 91 04/06/2015 1715   BUN 11 11/27/2017 1203   CREATININE 0.89 11/27/2017 1203   CALCIUM 9.7 11/27/2017 1203   GFRNONAA 79 11/27/2017 1203   GFRAA 91 11/27/2017 1203   Lab Results  Component Value Date   HGBA1C 5.4 03/12/2018   HGBA1C 5.7 (H) 11/27/2017   Lab Results    Component Value Date   INSULIN 6.7 03/12/2018   INSULIN 12.2 11/27/2017   CBC    Component Value Date/Time   WBC 8.0 11/27/2017 1203   WBC 10.4 04/06/2015 1715   RBC 4.54 11/27/2017 1203   RBC 4.58 04/06/2015 1715   HGB 13.6 11/27/2017 1203   HCT 38.8 11/27/2017 1203   PLT 343 04/06/2015 1715   MCV 86 11/27/2017 1203   MCH 30.0 11/27/2017 1203   MCH 30.3 04/06/2015 1715   MCHC 35.1 11/27/2017 1203   MCHC 33.7 04/06/2015 1715  RDW 14.5 11/27/2017 1203   LYMPHSABS 2.2 11/27/2017 1203   MONOABS 0.6 04/06/2015 1715   EOSABS 0.2 11/27/2017 1203   BASOSABS 0.0 11/27/2017 1203   Iron/TIBC/Ferritin/ %Sat No results found for: IRON, TIBC, FERRITIN, IRONPCTSAT Lipid Panel     Component Value Date/Time   CHOL 183 03/12/2018 0933   TRIG 80 03/12/2018 0933   HDL 39 (L) 03/12/2018 0933   LDLCALC 128 (H) 03/12/2018 0933   Hepatic Function Panel     Component Value Date/Time   PROT 7.8 11/27/2017 1203   ALBUMIN 4.8 11/27/2017 1203   AST 20 11/27/2017 1203   ALT 20 11/27/2017 1203   ALKPHOS 42 11/27/2017 1203   BILITOT 0.4 11/27/2017 1203      Component Value Date/Time   TSH 0.667 11/27/2017 1203   TSH 0.406 05/26/2009 1040    ASSESSMENT AND PLAN: Prediabetes - Plan: Hemoglobin A1c, Insulin, random, Lipid Panel With LDL/HDL Ratio, Vitamin B12  Vitamin D deficiency - Plan: VITAMIN D 25 Hydroxy (Vit-D Deficiency, Fractures)  At risk for diabetes mellitus  Class 1 obesity with serious comorbidity and body mass index (BMI) of 30.0 to 30.9 in adult, unspecified obesity type - Starting BMI greater then 30  PLAN: Pre-Diabetes Mylene will continue to work on weight loss, exercise, and decreasing simple carbohydrates in her diet to help decrease the risk of diabetes. We dicussed metformin including benefits and risks. She was informed that eating too many simple carbohydrates or too many calories at one sitting increases the likelihood of GI side effects. Lelah agrees to  continue taking Metformin 500 mg qd. Masaye agreed to follow up with [name] as directed to monitor her progress. We will check labs today. Dianelly agrees to follow up in our office in 2 weeks.  Diabetes risk counselling Alejah was given extended (15 minutes) diabetes prevention counseling today. She is 47 y.o. female and has risk factors for diabetes including obesity. We discussed intensive lifestyle modifications today with an emphasis on weight loss as well as increasing exercise and decreasing simple carbohydrates in her diet.  Vitamin D Deficiency Mitsuye was informed that low vitamin D levels contributes to fatigue and are associated with obesity, breast, and colon cancer. Vit D level checked today. She was informed of the risk of over-replacement of vitamin D and agrees to not increase her dose unless she discusses this with [name] first. Renata agrees to follow up with our office in 2 weeks.   Obesity Analiah is currently in the action stage of change. As such, her goal is to continue with weight loss efforts She has agreed to follow the Category 2 plan Blaise has been instructed to work up to a goal of 150 minutes of combined cardio and strengthening exercise per week for weight loss and overall health benefits. We discussed the following Behavioral Modification Stratagies today: increasing lean protein intake, planning for success, increasing vegetables and work on meal planning and easy cooking plans  Tanis has agreed to follow up with our clinic in 2 weeks. She was informed of the importance of frequent follow up visits to maximize her success with intensive lifestyle modifications for her multiple health conditions.   OBESITY BEHAVIORAL INTERVENTION VISIT  Today's visit was # 14   Starting weight: 199 lbs Starting date: 11/27/2017 Today's weight : Weight: 178 lb (80.7 kg)  Today's date: 07/16/2018 Total lbs lost to date: 21 lbs At least 15 minutes were spent on discussing the  following behavioral intervention visit.   ASK:  We discussed the diagnosis of obesity with Faiza Octaviano Glow today and Laramie agreed to give [name] permission to discuss obesity behavioral modification therapy today.  ASSESS: Della has the diagnosis of obesity and her BMI today is 29.62 [name] is in the action stage of change   ADVISE: Branna was educated on the multiple health risks of obesity as well as the benefit of weight loss to improve her health. She was advised of the need for long term treatment and the importance of lifestyle modifications to improve her current health and to decrease her risk of future health problems.  AGREE: Multiple dietary modification options and treatment options were discussed and  Hala agreed to follow the recommendations documented in the above note.  ARRANGE: Hannahgrace was educated on the importance of frequent visits to treat obesity as outlined per CMS and USPSTF guidelines and agreed to schedule her next follow up appointment today.  Felipa Emory, CMA, am acting as transcriptionist for Ilene Qua, MD  I have reviewed the above documentation for accuracy and completeness, and I agree with the above. - Ilene Qua, MD

## 2018-07-17 LAB — LIPID PANEL WITH LDL/HDL RATIO
CHOLESTEROL TOTAL: 208 mg/dL — AB (ref 100–199)
HDL: 47 mg/dL (ref >39)
LDL CALC: 140 mg/dL — AB (ref 0–99)
LDl/HDL Ratio: 3 ratio (ref 0.0–3.2)
TRIGLYCERIDES: 104 mg/dL (ref 0–149)
VLDL Cholesterol Cal: 21 mg/dL (ref 5–40)

## 2018-07-17 LAB — HEMOGLOBIN A1C
ESTIMATED AVERAGE GLUCOSE: 105 mg/dL
HEMOGLOBIN A1C: 5.3 % (ref 4.8–5.6)

## 2018-07-17 LAB — VITAMIN D 25 HYDROXY (VIT D DEFICIENCY, FRACTURES): Vit D, 25-Hydroxy: 66.4 ng/mL (ref 30.0–100.0)

## 2018-07-17 LAB — VITAMIN B12: Vitamin B-12: 1395 pg/mL — ABNORMAL HIGH (ref 232–1245)

## 2018-07-17 LAB — INSULIN, RANDOM: INSULIN: 8.2 u[IU]/mL (ref 2.6–24.9)

## 2018-07-30 ENCOUNTER — Ambulatory Visit (INDEPENDENT_AMBULATORY_CARE_PROVIDER_SITE_OTHER): Payer: 59 | Admitting: Family Medicine

## 2018-07-30 VITALS — BP 119/72 | HR 73 | Temp 97.9°F | Ht 65.0 in | Wt 181.0 lb

## 2018-07-30 DIAGNOSIS — Z6830 Body mass index (BMI) 30.0-30.9, adult: Secondary | ICD-10-CM | POA: Diagnosis not present

## 2018-07-30 DIAGNOSIS — Z9189 Other specified personal risk factors, not elsewhere classified: Secondary | ICD-10-CM | POA: Diagnosis not present

## 2018-07-30 DIAGNOSIS — R7303 Prediabetes: Secondary | ICD-10-CM | POA: Diagnosis not present

## 2018-07-30 DIAGNOSIS — E669 Obesity, unspecified: Secondary | ICD-10-CM | POA: Diagnosis not present

## 2018-07-30 DIAGNOSIS — E7849 Other hyperlipidemia: Secondary | ICD-10-CM

## 2018-07-30 MED ORDER — METFORMIN HCL 500 MG PO TABS: 500.0000 mg | ORAL_TABLET | Freq: Every day | ORAL | 0 refills | Status: DC

## 2018-07-30 MED FILL — metFORMIN HCL 500 MG TABS: 500 | 30 days supply | Qty: 30 | Fill #0

## 2018-07-31 NOTE — Progress Notes (Signed)
Office: [phone]  /  Fax: [phone]   HPI:   Chief Complaint: OBESITY Robin Knapp is here to discuss her progress with her obesity treatment plan. She is on the Category 2 plan and is following her eating plan approximately 50 % of the time. She states she is walking for 30 minutes 2 times per week. [name] voices that she has been struggling with stress eating and relying on physician lounge food. She has indulged quite a bit for her birthday. She plans to work for Thanksgiving.  Her weight is 181 lb (82.1 kg) today and has gained 3 pounds since her last visit. She has lost 18 lbs since starting treatment with [name].  Pre-Diabetes Robin Knapp has a diagnosis of pre-diabetes based on her elevated Hgb A1c and was informed this puts her at greater risk of developing diabetes. She is taking metformin currently and she notes stress eating and carbohydrate cravings. She continues to work on diet and exercise to decrease risk of diabetes. She denies nausea or hypoglycemia.  At risk for diabetes Robin Knapp is at higher than average risk for developing diabetes due to her obesity and pre-diabetes. She currently denies polyuria or polydipsia.  Hyperlipidemia Robin Knapp has hyperlipidemia and has been trying to improve her cholesterol levels with intensive lifestyle modification including a low saturated fat diet, exercise and weight loss. Her LDL is elevated, she is not on statin and denies any chest pain, claudication or myalgias.  ALLERGIES: Allergies  Allergen Reactions  . Sulfa Antibiotics Hives    itching  . Sunscreens     MEDICATIONS: Current Outpatient Medications on File Prior to Visit  Medication Sig Dispense Refill  . acetaminophen (TYLENOL 8 HOUR ARTHRITIS PAIN) 650 MG CR tablet Take 650 mg by mouth every 8 (eight) hours as needed for pain.    [name] albuterol (PROVENTIL HFA;VENTOLIN HFA) 108 (90 Base) MCG/ACT inhaler Inhale 2 puffs into the lungs every 6 (six) hours as needed for wheezing or  shortness of breath.    . cetirizine (ZYRTEC) 10 MG tablet Take 10 mg by mouth daily.    . Cholecalciferol ([employer] VITAMIN D3) 10000 units CAPS Take by mouth.    . gabapentin (NEURONTIN) 100 MG capsule Take 3 capsules (300 mg total) by mouth at bedtime. 90 capsule 11  . ibuprofen (ADVIL,MOTRIN) 600 MG tablet Take 600 mg by mouth every 6 (six) hours as needed.    [name] levonorgestrel (MIRENA) 20 MCG/24HR IUD 1 each by Intrauterine route once.    . Magnesium 250 MG TABS Take 1 tablet by mouth daily.    . montelukast (SINGULAIR) 10 MG tablet Take 10 mg by mouth at bedtime.    . Multiple Vitamins-Minerals (HAIR SKIN & NAILS ADVANCED PO) Take 1 tablet by mouth daily.    . Omega-3 Fatty Acids (OMEGA-3 FISH OIL) 1200 MG CAPS Take 2 capsules by mouth daily.    . Oral Electrolytes (REVITAL JELL CUPS PO) Take 1 capsule by mouth daily.     No current facility-administered medications on file prior to visit.     PAST MEDICAL HISTORY: Past Medical History:  Diagnosis Date  . Allergy   . Asthma   . Breast lump   . Constipation   . Dry skin   . Fatigue   . GERD (gastroesophageal reflux disease)    pregnancy related-zantac  . HTN (hypertension)   . Hyperlipidemia   . Infertility, female   . Joint pain   . Lactose intolerance   . Missed abortion  6weeks 3days  . Muscle pain   . Obesity   . Palpitations   . PCOS (polycystic ovarian syndrome)   . Seasonal allergies   . Skin rash   . Stress   . Vitamin D deficiency     PAST SURGICAL HISTORY: Past Surgical History:  Procedure Laterality Date  . BREAST BIOPSY    . CESAREAN SECTION    . DILATION AND EVACUATION  05/09/2012   Procedure: DILATATION AND EVACUATION;  Surgeon: Marvene Staff, MD;  Location: [facility];  Service: Gynecology;  Laterality: N/A;  . EYE SURGERY    . LASIK    . [place]    . WISDOM TOOTH EXTRACTION  1994    SOCIAL HISTORY: Social History   Tobacco Use  . Smoking status: Never Smoker  Substance Use Topics  . Alcohol  use: No    Alcohol/week: 0.0 standard drinks  . Drug use: No    FAMILY HISTORY: Family History  Problem Relation Age of Onset  . Hyperlipidemia Mother   . Hyperlipidemia Father   . Hypertension Father   . Anxiety disorder Father   . Hyperlipidemia Maternal Grandmother   . Hypertension Maternal Grandmother   . Heart disease Maternal Grandmother   . Heart disease Maternal Grandfather   . Diabetes Maternal Grandfather   . Stroke Paternal Grandmother   . Mental illness Paternal Grandmother   . Heart disease Paternal Grandmother     ROS: Review of Systems  Constitutional: Negative for weight loss.  Cardiovascular: Negative for chest pain and claudication.  Gastrointestinal: Negative for nausea.  Genitourinary: Negative for frequency.  Musculoskeletal: Negative for myalgias.  Endo/Heme/Allergies: Negative for polydipsia.       Negative hypoglycemia    PHYSICAL EXAM: Blood pressure 119/72, pulse 73, temperature 97.9 F (36.6 C), temperature source Oral, height 5\' 5"  (1.651 m), weight 181 lb (82.1 kg), SpO2 100 %. Body mass index is 30.12 kg/m. Physical Exam  Constitutional: She is oriented to person, place, and time. She appears well-developed and well-nourished.  Cardiovascular: Normal rate.  Pulmonary/Chest: Effort normal.  Musculoskeletal: Normal range of motion.  Neurological: She is oriented to person, place, and time.  Skin: Skin is warm and dry.  Psychiatric: She has a normal mood and affect. Her behavior is normal.  Vitals reviewed.   RECENT LABS AND TESTS: BMET    Component Value Date/Time   NA 139 11/27/2017 1203   K 4.3 11/27/2017 1203   CL 99 11/27/2017 1203   CO2 24 11/27/2017 1203   GLUCOSE 84 11/27/2017 1203   GLUCOSE 91 04/06/2015 1715   BUN 11 11/27/2017 1203   CREATININE 0.89 11/27/2017 1203   CALCIUM 9.7 11/27/2017 1203   GFRNONAA 79 11/27/2017 1203   GFRAA 91 11/27/2017 1203   Lab Results  Component Value Date   HGBA1C 5.3 07/16/2018    HGBA1C 5.4 03/12/2018   HGBA1C 5.7 (H) 11/27/2017   Lab Results  Component Value Date   INSULIN 8.2 07/16/2018   INSULIN 6.7 03/12/2018   INSULIN 12.2 11/27/2017   CBC    Component Value Date/Time   WBC 8.0 11/27/2017 1203   WBC 10.4 04/06/2015 1715   RBC 4.54 11/27/2017 1203   RBC 4.58 04/06/2015 1715   HGB 13.6 11/27/2017 1203   HCT 38.8 11/27/2017 1203   PLT 343 04/06/2015 1715   MCV 86 11/27/2017 1203   MCH 30.0 11/27/2017 1203   MCH 30.3 04/06/2015 1715   MCHC 35.1 11/27/2017 1203   MCHC 33.7  04/06/2015 1715   RDW 14.5 11/27/2017 1203   LYMPHSABS 2.2 11/27/2017 1203   MONOABS 0.6 04/06/2015 1715   EOSABS 0.2 11/27/2017 1203   BASOSABS 0.0 11/27/2017 1203   Iron/TIBC/Ferritin/ %Sat No results found for: IRON, TIBC, FERRITIN, IRONPCTSAT Lipid Panel     Component Value Date/Time   CHOL 208 (H) 07/16/2018 0902   TRIG 104 07/16/2018 0902   HDL 47 07/16/2018 0902   LDLCALC 140 (H) 07/16/2018 0902   Hepatic Function Panel     Component Value Date/Time   PROT 7.8 11/27/2017 1203   ALBUMIN 4.8 11/27/2017 1203   AST 20 11/27/2017 1203   ALT 20 11/27/2017 1203   ALKPHOS 42 11/27/2017 1203   BILITOT 0.4 11/27/2017 1203      Component Value Date/Time   TSH 0.667 11/27/2017 1203   TSH 0.406  05/26/2009 1040    ASSESSMENT AND PLAN: Prediabetes - Plan: metFORMIN (GLUCOPHAGE) 500 MG tablet  Other hyperlipidemia  At risk for diabetes mellitus  Class 1 obesity with serious comorbidity and body mass index (BMI) of 30.0 to 30.9 in adult, unspecified obesity type  PLAN:  Pre-Diabetes Sarissa will continue to work on weight loss, exercise, and decreasing simple carbohydrates in her diet to help decrease the risk of diabetes. We dicussed metformin including benefits and risks. She was informed that eating too many simple carbohydrates or too many calories at one sitting increases the likelihood of GI side effects. Kayleeann agrees to continue taking metformin 500 mg PO  q daily #30 and we will refill for 1 month. Tiffnay agrees to follow up with our clinic in 2 weeks as directed to monitor her progress.  Diabetes risk counselling Thera was given extended (15 minutes) diabetes prevention counseling today. She is 47 y.o. female and has risk factors for diabetes including obesity and pre-diabetes. We discussed intensive lifestyle modifications today with an emphasis on weight loss as well as increasing exercise and decreasing simple carbohydrates in her diet.  Hyperlipidemia Leilana was informed of the American Heart Association Guidelines emphasizing intensive lifestyle modifications as the first line treatment for hyperlipidemia. We discussed many lifestyle modifications today in depth, and Abegail will continue to work on decreasing saturated fats such as fatty red meat, butter and many fried foods. Heath is to refocus on Category 2 plan. She will also increase vegetables and lean protein in her diet and continue to work on exercise and weight loss efforts. We will repeat FLP in 3 months. Dorthie agrees to follow up with our clinic in 2 weeks.  Obesity Mahati is currently in the action stage of change. As such, her goal is to continue with weight loss efforts She has agreed to follow the Category 2 plan Lielle has been instructed to work up to a goal of 150 minutes of combined cardio and strengthening exercise per week for weight loss and overall health benefits. We discussed the following Behavioral Modification Strategies today: increasing lean protein intake, increasing vegetables and work on meal planning and easy cooking plans   Kenzlee has agreed to follow up with our clinic in 2 weeks. She was informed of the importance of frequent follow up visits to maximize her success with intensive lifestyle modifications for her multiple health conditions.   OBESITY BEHAVIORAL INTERVENTION VISIT  Today's visit was # 15   Starting weight: 199 lbs Starting  date: 11/27/17 Today's weight : 181 lbs  Today's date: 07/30/2018 Total lbs lost to date: 18    ASK: We discussed the  diagnosis of obesity with Graceann Congress today and Shaine agreed to give [name] permission to discuss obesity behavioral modification therapy today.  ASSESS: Renn has the diagnosis of obesity and her BMI today is 30.12 Cortney is in the action stage of change   ADVISE: Vestal was educated on the multiple health risks of obesity as well as the benefit of weight loss to improve her health. She was advised of the need for long term treatment and the importance of lifestyle modifications to improve her current health and to decrease her risk of future health problems.  AGREE: Multiple dietary modification options and treatment options were discussed and  Mariyana agreed to follow the recommendations documented in the above note.  ARRANGE: Kyisha was educated on the importance of frequent visits to treat obesity as outlined per CMS and USPSTF guidelines and agreed to schedule her next follow up appointment today.  I, Trixie Dredge, am acting as transcriptionist for Ilene Qua, MD  I have reviewed the above documentation for accuracy and completeness, and I agree with the above. - Ilene Qua, MD

## 2018-08-05 DIAGNOSIS — Z6830 Body mass index (BMI) 30.0-30.9, adult: Secondary | ICD-10-CM | POA: Diagnosis not present

## 2018-08-05 DIAGNOSIS — Z01419 Encounter for gynecological examination (general) (routine) without abnormal findings: Secondary | ICD-10-CM | POA: Diagnosis not present

## 2018-08-05 DIAGNOSIS — N898 Other specified noninflammatory disorders of vagina: Secondary | ICD-10-CM | POA: Diagnosis not present

## 2018-08-05 DIAGNOSIS — Z1151 Encounter for screening for human papillomavirus (HPV): Secondary | ICD-10-CM | POA: Diagnosis not present

## 2018-08-05 MED FILL — NYSTATIN-TRIAMCINOLONE CRM: 100000-0.1 | 7 days supply | Qty: 30 | Fill #0

## 2018-08-05 MED FILL — FLUCONAZOLE 150 MG TABS: 150 | 1 days supply | Qty: 1 | Fill #0

## 2018-08-13 ENCOUNTER — Ambulatory Visit (INDEPENDENT_AMBULATORY_CARE_PROVIDER_SITE_OTHER): Payer: 59 | Admitting: Family Medicine

## 2018-08-13 VITALS — BP 124/77 | HR 69 | Temp 98.3°F | Ht 65.0 in | Wt 179.0 lb

## 2018-08-13 DIAGNOSIS — Z6830 Body mass index (BMI) 30.0-30.9, adult: Secondary | ICD-10-CM

## 2018-08-13 DIAGNOSIS — E669 Obesity, unspecified: Secondary | ICD-10-CM | POA: Diagnosis not present

## 2018-08-13 DIAGNOSIS — E559 Vitamin D deficiency, unspecified: Secondary | ICD-10-CM | POA: Diagnosis not present

## 2018-08-13 DIAGNOSIS — R7303 Prediabetes: Secondary | ICD-10-CM

## 2018-08-20 NOTE — Progress Notes (Signed)
Office: [phone]  /  Fax: [phone]   HPI:   Chief Complaint: OBESITY Robin Knapp is here to discuss her progress with her obesity treatment plan. She is on the Category 2 plan and is following her eating plan approximately 60 % of the time. She states she is walking 30 minutes 2 times per week. Robin Knapp reports that she was more conscious of choices that she made in the past few weeks. She tried to control stress eating. For example, she had a craving for chocolate and had an apple instead. She plans to go to a [place] for [employer] birthday in 3 days.  Her weight is 179 lb (81.2 kg) today and has had a weight loss of 2 pounds over a period of 2 weeks since her last visit. She has lost 20 lbs since starting treatment with [name].  Pre-Diabetes Robin Knapp has a diagnosis of pre-diabetes based on her elevated Hgb A1c and was informed this puts her at greater risk of developing diabetes. She is taking metformin currently and denies GI side effects. She is having occasional carb cravings and she continues to work on diet and exercise to decrease risk of diabetes.   Vitamin D deficiency Robin Knapp has a diagnosis of vitamin D deficiency. She is currently taking OTC vit D. She admits fatigue is improving and denies nausea, vomiting, or muscle weakness.  ALLERGIES: Allergies  Allergen Reactions  . Sulfa Antibiotics Hives    itching  . Sunscreens     MEDICATIONS: Current Outpatient Medications on File Prior to Visit  Medication Sig Dispense Refill  . acetaminophen (TYLENOL 8 HOUR ARTHRITIS PAIN) 650 MG CR tablet Take 650 mg by mouth every 8 (eight) hours as needed for pain.    [name] albuterol (PROVENTIL HFA;VENTOLIN HFA) 108 (90 Base) MCG/ACT inhaler Inhale 2 puffs into the lungs every 6 (six) hours as needed for wheezing or shortness of breath.    . cetirizine (ZYRTEC) 10 MG tablet Take 10 mg by mouth daily.    . Cholecalciferol (CVS VITAMIN D3) 10000 units CAPS Take by mouth.    .  gabapentin (NEURONTIN) 100 MG capsule Take 3 capsules (300 mg total) by mouth at bedtime. 90 capsule 11  . ibuprofen (ADVIL,MOTRIN) 600 MG tablet Take 600 mg by mouth every 6 (six) hours as needed.    [name] levonorgestrel (MIRENA) 20 MCG/24HR IUD 1 each by Intrauterine route once.    . Magnesium 250 MG TABS Take 1 tablet by mouth daily.    . metFORMIN (GLUCOPHAGE) 500 MG tablet Take 1 tablet (500 mg total) by mouth daily with breakfast. 30 tablet 0  . montelukast (SINGULAIR) 10 MG tablet Take 10 mg by mouth at bedtime.    . Multiple Vitamins-Minerals (HAIR SKIN & NAILS ADVANCED PO) Take 1 tablet by mouth daily.    . Omega-3 Fatty Acids (OMEGA-3 FISH OIL) 1200 MG CAPS Take 2 capsules by mouth daily.    . Oral Electrolytes (REVITAL JELL CUPS PO) Take 1 capsule by mouth daily.     No current facility-administered medications on file prior to visit.     PAST MEDICAL HISTORY: Past Medical History:  Diagnosis Date  . Allergy   . Asthma   . Breast lump   . Constipation   . Dry skin   . Fatigue   . GERD (gastroesophageal reflux disease)    pregnancy related-zantac  . HTN (hypertension)   . Hyperlipidemia   . Infertility, female   . Joint pain   . Lactose  intolerance   . Missed abortion    6weeks 3days  . Muscle pain   . Obesity   . Palpitations   . PCOS (polycystic ovarian syndrome)   . Seasonal allergies   . Skin rash   . Stress   . Vitamin D deficiency     PAST SURGICAL HISTORY: Past Surgical History:  Procedure Laterality Date  . BREAST BIOPSY    . CESAREAN SECTION    . DILATION AND EVACUATION  05/09/2012   Procedure: DILATATION AND EVACUATION;  Surgeon: Marvene Staff, MD;  Location: [facility];  Service: Gynecology;  Laterality: N/A;  . EYE SURGERY    . LASIK    . [place]    . WISDOM TOOTH EXTRACTION  1994    SOCIAL HISTORY: Social History   Tobacco Use  . Smoking status: Never Smoker  Substance Use Topics  . Alcohol use: No    Alcohol/week: 0.0 standard drinks  .  Drug use: No    FAMILY HISTORY: Family History  Problem Relation Age of Onset  . Hyperlipidemia Mother   . Hyperlipidemia Father   . Hypertension Father   . Anxiety disorder Father   . Hyperlipidemia Maternal Grandmother   . Hypertension Maternal Grandmother   . Heart disease Maternal Grandmother   . Heart disease Maternal Grandfather   . Diabetes Maternal Grandfather   . Stroke Paternal Grandmother   . Mental illness Paternal Grandmother   . Heart disease Paternal Grandmother     ROS: Review of Systems  Constitutional: Positive for malaise/fatigue and weight loss.  Gastrointestinal: Negative for diarrhea, nausea and vomiting.  Musculoskeletal:       Negative for muscle weakness.    PHYSICAL EXAM: Blood pressure 124/77, pulse 69, temperature 98.3 F (36.8 C), temperature source Oral, height 5\' 5"  (1.651 m), weight 179 lb (81.2 kg), SpO2 99 %. Body mass index is 29.79 kg/m. Physical Exam  Constitutional: She is oriented to person, place, and time. She appears well-developed and well-nourished.  Cardiovascular: Normal rate.  Pulmonary/Chest: Effort normal.  Musculoskeletal: Normal range of motion.  Neurological: She is oriented to person, place, and time.  Skin: Skin is warm and dry.  Psychiatric: She has a normal mood and affect. Her behavior is normal.  Vitals reviewed.   RECENT LABS AND TESTS: BMET    Component Value Date/Time   NA 139 11/27/2017 1203   K 4.3 11/27/2017 1203   CL 99 11/27/2017 1203   CO2 24 11/27/2017 1203   GLUCOSE 84 11/27/2017 1203   GLUCOSE 91 04/06/2015 1715   BUN 11 11/27/2017 1203   CREATININE 0.89 11/27/2017 1203   CALCIUM 9.7 11/27/2017 1203   GFRNONAA 79 11/27/2017 1203   GFRAA 91 11/27/2017 1203   Lab Results  Component Value Date   HGBA1C 5.3 07/16/2018   HGBA1C 5.4 03/12/2018   HGBA1C 5.7 (H) 11/27/2017   Lab Results  Component Value Date   INSULIN 8.2 07/16/2018   INSULIN 6.7 03/12/2018   INSULIN 12.2 11/27/2017    CBC    Component Value Date/Time   WBC 8.0 11/27/2017 1203   WBC 10.4 04/06/2015 1715   RBC 4.54 11/27/2017 1203   RBC 4.58 04/06/2015 1715   HGB 13.6 11/27/2017 1203   HCT 38.8 11/27/2017 1203   PLT 343 04/06/2015 1715   MCV 86 11/27/2017 1203   MCH 30.0 11/27/2017 1203   MCH 30.3 04/06/2015 1715   MCHC 35.1 11/27/2017 1203   MCHC 33.7 04/06/2015 1715   RDW  14.5 11/27/2017 1203   LYMPHSABS 2.2 11/27/2017 1203   MONOABS 0.6 04/06/2015 1715   EOSABS 0.2 11/27/2017 1203   BASOSABS 0.0 11/27/2017 1203   Iron/TIBC/Ferritin/ %Sat No results found for: IRON, TIBC, FERRITIN, IRONPCTSAT Lipid Panel     Component Value Date/Time   CHOL 208 (H) 07/16/2018 0902   TRIG 104 07/16/2018 0902   HDL 47 07/16/2018 0902   LDLCALC 140 (H) 07/16/2018 0902   Hepatic Function Panel     Component Value Date/Time   PROT 7.8 11/27/2017 1203   ALBUMIN 4.8 11/27/2017 1203   AST 20 11/27/2017 1203   ALT 20 11/27/2017 1203   ALKPHOS 42 11/27/2017 1203   BILITOT 0.4 11/27/2017 1203      Component Value Date/Time   TSH 0.667 11/27/2017 1203   TSH 0.406  05/26/2009 1040   Results for BRENTLEE, SCIARA (MRN [id number]) as of 08/20/2018 09:29  Ref. Range 07/16/2018 09:02  Vitamin D, 25-Hydroxy Latest Ref Range: 30.0 - 100.0 ng/mL 66.4   ASSESSMENT AND PLAN: Prediabetes  Vitamin D deficiency  Class 1 obesity with serious comorbidity and body mass index (BMI) of 30.0 to 30.9 in adult, unspecified obesity type - BMI greater than 30 at start of program  PLAN:  Pre-Diabetes Arturo will continue to work on weight loss, exercise, and decreasing simple carbohydrates in her diet to help decrease the risk of diabetes. She was informed that eating too many simple carbohydrates or too many calories at one sitting increases the likelihood of GI side effects. Iley agreed to continue taking metformin and a prescription was not written today. Will agreed to follow up with [name] as directed to monitor  her progress in 2 weeks.  Vitamin D Deficiency Robin Knapp was informed that low vitamin D levels contributes to fatigue and are associated with obesity, breast, and colon cancer. She agrees to continue to take OTC Vit D 10,000 units every day and will follow up for routine testing of vitamin D, at least 2-3 times per year. She was informed of the risk of over-replacement of vitamin D and agrees to not increase her dose unless she discusses this with [name] first. Robin Knapp agrees to follow up as directed.  I spent > than 50% of the 15 minute visit on counseling as documented in the note.  Obesity Robin Knapp is currently in the action stage of change. As such, her goal is to continue with weight loss efforts. She has agreed to follow the Category 2 plan. Robin Knapp has been instructed to work up to a goal of 150 minutes of combined cardio and strengthening exercise per week for weight loss and overall health benefits. We discussed the following Behavioral Modification Strategies today: increasing lean protein intake, increasing vegetables, work on meal planning and easy cooking plans, holiday eating strategies, and planning for success.   Robin Knapp has agreed to follow up with our clinic in 2 weeks. She was informed of the importance of frequent follow up visits to maximize her success with intensive lifestyle modifications for her multiple health conditions.   OBESITY BEHAVIORAL INTERVENTION VISIT  Today's visit was # 16   Starting weight: 199 lbs Starting date: 11/27/17 Today's weight : Weight: 179 lb (81.2 kg)  Today's date: 08/13/2018 Total lbs lost to date: 20  ASK: We discussed the diagnosis of obesity with Robin Knapp today and Robin Knapp agreed to give [name] permission to discuss obesity behavioral modification therapy today.  ASSESS: Robin Knapp has the diagnosis of obesity and her BMI today  is 29.79. Robin Knapp is in the action stage of change.   ADVISE: Kaysen was educated on the multiple health  risks of obesity as well as the benefit of weight loss to improve her health. She was advised of the need for long term treatment and the importance of lifestyle modifications to improve her current health and to decrease her risk of future health problems.  AGREE: Multiple dietary modification options and treatment options were discussed and Robin Knapp agreed to follow the recommendations documented in the above note.  ARRANGE: Robin Knapp was educated on the importance of frequent visits to treat obesity as outlined per CMS and USPSTF guidelines and agreed to schedule her next follow up appointment today.  I, Marcille Blanco, am acting as Location manager for Eber Jones, MD I have reviewed the above documentation for accuracy and completeness, and I agree with the above. - Ilene Qua, MD

## 2018-09-02 ENCOUNTER — Encounter (INDEPENDENT_AMBULATORY_CARE_PROVIDER_SITE_OTHER): Payer: Self-pay | Admitting: Family Medicine

## 2018-09-02 ENCOUNTER — Ambulatory Visit (INDEPENDENT_AMBULATORY_CARE_PROVIDER_SITE_OTHER): Payer: 59 | Admitting: Family Medicine

## 2018-09-02 VITALS — BP 122/76 | HR 68 | Temp 97.9°F | Ht 65.0 in | Wt 181.0 lb

## 2018-09-02 DIAGNOSIS — E7849 Other hyperlipidemia: Secondary | ICD-10-CM

## 2018-09-02 DIAGNOSIS — R7303 Prediabetes: Secondary | ICD-10-CM | POA: Diagnosis not present

## 2018-09-02 DIAGNOSIS — E669 Obesity, unspecified: Secondary | ICD-10-CM

## 2018-09-02 DIAGNOSIS — Z6830 Body mass index (BMI) 30.0-30.9, adult: Secondary | ICD-10-CM

## 2018-09-02 DIAGNOSIS — Z9189 Other specified personal risk factors, not elsewhere classified: Secondary | ICD-10-CM | POA: Diagnosis not present

## 2018-09-02 MED ORDER — METFORMIN HCL 500 MG PO TABS: 500.0000 mg | ORAL_TABLET | Freq: Every day | ORAL | 0 refills | Status: DC

## 2018-09-02 MED FILL — metFORMIN HCL 500 MG TABS: 500 | 30 days supply | Qty: 30 | Fill #0

## 2018-09-02 NOTE — Progress Notes (Signed)
Office: [phone]  /  Fax: [phone]   HPI:   Chief Complaint: OBESITY Robin Knapp is here to discuss her progress with her obesity treatment plan. She is on the Category 2 plan and is following her eating plan approximately 40 % of the time. She states she is walking and running for 30 minutes 2 times per week. Robin Knapp has had increase in stress recently and may be self sabotaging with not planning ahead, then getting too hungry and eating what is available; which aren't the healthiest options.  Her weight is 181 lb (82.1 kg) today and has gained 2 pounds since her last visit. She has lost 18 lbs since starting treatment with [name].  Pre-Diabetes Robin Knapp has a diagnosis of pre-diabetes based on her elevated Hgb A1c and was informed this puts her at greater risk of developing diabetes. She notes carbohydrate cravings and denies GI upset with metformin. She continues to work on diet and exercise to decrease risk of diabetes. She denies hypoglycemia.  At risk for diabetes Robin Knapp is at higher than average risk for developing diabetes due to her obesity and pre-diabetes. She currently denies polyuria or polydipsia.  Hyperlipidemia Robin Knapp has hyperlipidemia and has been trying to improve her cholesterol levels with intensive lifestyle modification including a low saturated fat diet, exercise and weight loss. Her LDL is elevated again and she is not on statin. She denies any chest pain, claudication or myalgias.  ASSESSMENT AND PLAN:  Prediabetes - Plan: metFORMIN (GLUCOPHAGE) 500 MG tablet  Other hyperlipidemia  At risk for diabetes mellitus  Class 1 obesity with serious comorbidity and body mass index (BMI) of 30.0 to 30.9 in adult, unspecified obesity type  PLAN:  Pre-Diabetes Robin Knapp will continue to work on weight loss, exercise, and decreasing simple carbohydrates in her diet to help decrease the risk of diabetes. We dicussed metformin including benefits and risks. She was  informed that eating too many simple carbohydrates or too many calories at one sitting increases the likelihood of GI side effects. Robin Knapp agrees to continue taking metformin 500 mg PO q AM #30 and we will refill for 1 month. Robin Knapp agrees to follow up with our clinic in 2 weeks as directed to monitor her progress.  Diabetes risk counselling Robin Knapp was given extended (15 minutes) diabetes prevention counseling today. She is 47 y.o. female and has risk factors for diabetes including obesity and pre-diabetes. We discussed intensive lifestyle modifications today with an emphasis on weight loss as well as increasing exercise and decreasing simple carbohydrates in her diet.  Hyperlipidemia Robin Knapp was informed of the American Heart Association Guidelines emphasizing intensive lifestyle modifications as the first line treatment for hyperlipidemia. We discussed many lifestyle modifications today in depth, and Robin Knapp will continue to work on decreasing saturated fats such as fatty red meat, butter and many fried foods. She will also increase vegetables and lean protein in her diet and continue to work on exercise and weight loss efforts. We will repeat labs in early February. Robin Knapp agrees to follow up with our clinic in 2 weeks.  Obesity Robin Knapp is currently in the action stage of change. As such, her goal is to continue with weight loss efforts She has agreed to follow the Category 2 plan Robin Knapp has been instructed to work up to a goal of 150 minutes of combined cardio and strengthening exercise per week for weight loss and overall health benefits. We discussed the following Behavioral Modification Strategies today: work on meal planning and easy cooking plans, holiday  eating strategies, better snacking choices, and planning for success    Robin Knapp has agreed to follow up with our clinic in 2 weeks. She was informed of the importance of frequent follow up visits to maximize her success with intensive  lifestyle modifications for her multiple health conditions.  ALLERGIES: Allergies  Allergen Reactions  . Sulfa Antibiotics Hives    itching  . Sunscreens     MEDICATIONS: Current Outpatient Medications on File Prior to Visit  Medication Sig Dispense Refill  . acetaminophen (TYLENOL 8 HOUR ARTHRITIS PAIN) 650 MG CR tablet Take 650 mg by mouth every 8 (eight) hours as needed for pain.    [name] albuterol (PROVENTIL HFA;VENTOLIN HFA) 108 (90 Base) MCG/ACT inhaler Inhale 2 puffs into the lungs every 6 (six) hours as needed for wheezing or shortness of breath.    . cetirizine (ZYRTEC) 10 MG tablet Take 10 mg by mouth daily.    . Cholecalciferol ([employer] VITAMIN D3) 10000 units CAPS Take by mouth.    . gabapentin (NEURONTIN) 100 MG capsule Take 3 capsules (300 mg total) by mouth at bedtime. 90 capsule 11  . ibuprofen (ADVIL,MOTRIN) 600 MG tablet Take 600 mg by mouth every 6 (six) hours as needed.    [name] levonorgestrel (MIRENA) 20 MCG/24HR IUD 1 each by Intrauterine route once.    . Magnesium 250 MG TABS Take 1 tablet by mouth daily.    . montelukast (SINGULAIR) 10 MG tablet Take 10 mg by mouth at bedtime.    . Multiple Vitamins-Minerals (HAIR SKIN & NAILS ADVANCED PO) Take 1 tablet by mouth daily.    . Omega-3 Fatty Acids (OMEGA-3 FISH OIL) 1200 MG CAPS Take 2 capsules by mouth daily.    . Oral Electrolytes (REVITAL JELL CUPS PO) Take 1 capsule by mouth daily.     No current facility-administered medications on file prior to visit.     PAST MEDICAL HISTORY: Past Medical History:  Diagnosis Date  . Allergy   . Asthma   . Breast lump   . Constipation   . Dry skin   . Fatigue   . GERD (gastroesophageal reflux disease)    pregnancy related-zantac  . HTN (hypertension)   . Hyperlipidemia   . Infertility, female   . Joint pain   . Lactose intolerance   . Missed abortion    6weeks 3days  . Muscle pain   . Obesity   . Palpitations   . PCOS (polycystic ovarian syndrome)   . Seasonal  allergies   . Skin rash   . Stress   . Vitamin D deficiency     PAST SURGICAL HISTORY: Past Surgical History:  Procedure Laterality Date  . BREAST BIOPSY    . CESAREAN SECTION    . DILATION AND EVACUATION  05/09/2012   Procedure: DILATATION AND EVACUATION;  Surgeon: Marvene Staff, MD;  Location: [facility];  Service: Gynecology;  Laterality: N/A;  . EYE SURGERY    . LASIK    . [place]    . WISDOM TOOTH EXTRACTION  1994    SOCIAL HISTORY: Social History   Tobacco Use  . Smoking status: Never Smoker  Substance Use Topics  . Alcohol use: No    Alcohol/week: 0.0 standard drinks  . Drug use: No    FAMILY HISTORY: Family History  Problem Relation Age of Onset  . Hyperlipidemia Mother   . Hyperlipidemia Father   . Hypertension Father   . Anxiety disorder Father   . Hyperlipidemia Maternal Grandmother   .  Hypertension Maternal Grandmother   . Heart disease Maternal Grandmother   . Heart disease Maternal Grandfather   . Diabetes Maternal Grandfather   . Stroke Paternal Grandmother   . Mental illness Paternal Grandmother   . Heart disease Paternal Grandmother     ROS: Review of Systems  Constitutional: Negative for weight loss.  Cardiovascular: Negative for chest pain and claudication.  Genitourinary: Negative for frequency.  Musculoskeletal: Negative for myalgias.  Endo/Heme/Allergies: Negative for polydipsia.       Negative hypoglycemia    PHYSICAL EXAM: Blood pressure 122/76, pulse 68, temperature 97.9 F (36.6 C), temperature source Oral, height 5\' 5"  (1.651 m), weight 181 lb (82.1 kg), SpO2 100 %. Body mass index is 30.12 kg/m. Physical Exam Vitals signs reviewed.  Constitutional:      Appearance: Normal appearance. She is obese.  Cardiovascular:     Rate and Rhythm: Normal rate.  Pulmonary:     Effort: Pulmonary effort is normal.  Musculoskeletal: Normal range of motion.  Skin:    General: Skin is warm and dry.  Neurological:     Mental Status: She  is alert and oriented to person, place, and time.  Psychiatric:        Mood and Affect: Mood normal.        Behavior: Behavior normal.     RECENT LABS AND TESTS: BMET    Component Value Date/Time   NA 139 11/27/2017 1203   K 4.3 11/27/2017 1203   CL 99 11/27/2017 1203   CO2 24 11/27/2017 1203   GLUCOSE 84 11/27/2017 1203   GLUCOSE 91 04/06/2015 1715   BUN 11 11/27/2017 1203   CREATININE 0.89 11/27/2017 1203   CALCIUM 9.7 11/27/2017 1203   GFRNONAA 79 11/27/2017 1203   GFRAA 91 11/27/2017 1203   Lab Results  Component Value Date   HGBA1C 5.3 07/16/2018   HGBA1C 5.4 03/12/2018   HGBA1C 5.7 (H) 11/27/2017   Lab Results  Component Value Date   INSULIN 8.2 07/16/2018   INSULIN 6.7 03/12/2018   INSULIN 12.2 11/27/2017   CBC    Component Value Date/Time   WBC 8.0 11/27/2017 1203   WBC 10.4 04/06/2015 1715   RBC 4.54 11/27/2017 1203   RBC 4.58 04/06/2015 1715   HGB 13.6 11/27/2017 1203   HCT 38.8 11/27/2017 1203   PLT 343 04/06/2015 1715   MCV 86 11/27/2017 1203   MCH 30.0 11/27/2017 1203   MCH 30.3 04/06/2015 1715   MCHC 35.1 11/27/2017 1203   MCHC 33.7 04/06/2015 1715   RDW 14.5 11/27/2017 1203   LYMPHSABS 2.2 11/27/2017 1203   MONOABS 0.6 04/06/2015 1715   EOSABS 0.2 11/27/2017 1203   BASOSABS 0.0 11/27/2017 1203   Iron/TIBC/Ferritin/ %Sat No results found for: IRON, TIBC, FERRITIN, IRONPCTSAT Lipid Panel     Component Value Date/Time   CHOL 208 (H) 07/16/2018 0902   TRIG 104 07/16/2018 0902   HDL 47 07/16/2018 0902   LDLCALC 140 (H) 07/16/2018 0902   Hepatic Function Panel     Component Value Date/Time   PROT 7.8 11/27/2017 1203   ALBUMIN 4.8 11/27/2017 1203   AST 20 11/27/2017 1203   ALT 20 11/27/2017 1203   ALKPHOS 42 11/27/2017 1203   BILITOT 0.4 11/27/2017 1203      Component Value Date/Time   TSH 0.667 11/27/2017 1203   TSH 0.406  05/26/2009 1040      OBESITY BEHAVIORAL INTERVENTION VISIT  Today's visit was # 17   Starting  weight:  199 lbs Starting date: 11/27/17 Today's weight : 181 lbs Today's date: 09/02/2018 Total lbs lost to date: 30    ASK: We discussed the diagnosis of obesity with Graceann Congress today and Kailin agreed to give [name] permission to discuss obesity behavioral modification therapy today.  ASSESS: Carrissa has the diagnosis of obesity and her BMI today is 30.12 Zanaria is in the action stage of change   ADVISE: Renatha was educated on the multiple health risks of obesity as well as the benefit of weight loss to improve her health. She was advised of the need for long term treatment and the importance of lifestyle modifications to improve her current health and to decrease her risk of future health problems.  AGREE: Multiple dietary modification options and treatment options were discussed and  Tisheena agreed to follow the recommendations documented in the above note.  ARRANGE: Monasia was educated on the importance of frequent visits to treat obesity as outlined per CMS and USPSTF guidelines and agreed to schedule her next follow up appointment today.  I, Trixie Dredge, am acting as transcriptionist for Ilene Qua, MD  I have reviewed the above documentation for accuracy and completeness, and I agree with the above. - Ilene Qua, MD

## 2018-09-08 DIAGNOSIS — Z30433 Encounter for removal and reinsertion of intrauterine contraceptive device: Secondary | ICD-10-CM | POA: Diagnosis not present

## 2018-09-08 DIAGNOSIS — N854 Malposition of uterus: Secondary | ICD-10-CM | POA: Diagnosis not present

## 2018-09-23 ENCOUNTER — Ambulatory Visit (INDEPENDENT_AMBULATORY_CARE_PROVIDER_SITE_OTHER): Payer: No Typology Code available for payment source | Admitting: Family Medicine

## 2018-09-23 ENCOUNTER — Encounter (INDEPENDENT_AMBULATORY_CARE_PROVIDER_SITE_OTHER): Payer: Self-pay | Admitting: Family Medicine

## 2018-09-23 VITALS — BP 135/81 | HR 73 | Temp 97.8°F | Ht 65.0 in | Wt 180.0 lb

## 2018-09-23 DIAGNOSIS — Z6830 Body mass index (BMI) 30.0-30.9, adult: Secondary | ICD-10-CM

## 2018-09-23 DIAGNOSIS — E669 Obesity, unspecified: Secondary | ICD-10-CM | POA: Diagnosis not present

## 2018-09-23 DIAGNOSIS — R7303 Prediabetes: Secondary | ICD-10-CM | POA: Diagnosis not present

## 2018-09-23 DIAGNOSIS — E7849 Other hyperlipidemia: Secondary | ICD-10-CM

## 2018-09-24 NOTE — Progress Notes (Signed)
Office: [phone]  /  Fax: [phone]   HPI:   Chief Complaint: OBESITY Robin Knapp is here to discuss her progress with her obesity treatment plan. She is on the Category 2 plan and is following her eating plan approximately 50 % of the time. She states she is exercising 0 minutes 0 times per week. Robin Knapp voices over the holidays she ate a lot of indulgent foods and didn't exercise. She felt she definitely gained weight over the holidays. She notes food for Category not in the house.  Her weight is 180 lb (81.6 kg) today and has had a weight loss of 1 pound over a period of 3 weeks since her last visit. She has lost 19 lbs since starting treatment with [name].  Pre-Diabetes Robin Knapp has a diagnosis of pre-diabetes based on her elevated Hgb A1c and was informed this puts her at greater risk of developing diabetes. She notes carbohydrate cravings and denies GI side effects of metformin. She continues to work on diet and exercise to decrease risk of diabetes. She denies hypoglycemia.  Hyperlipidemia Robin Knapp has hyperlipidemia and has been trying to improve her cholesterol levels with intensive lifestyle modification including a low saturated fat diet, exercise and weight loss. She is taking fish oils and she is not on statin. She denies any chest pain, claudication or myalgias.  ASSESSMENT AND PLAN:  Prediabetes  Other hyperlipidemia  Class 1 obesity with serious comorbidity and body mass index (BMI) of 30.0 to 30.9 in adult, unspecified obesity type  PLAN:  Pre-Diabetes Robin Knapp will continue to work on weight loss, exercise, and decreasing simple carbohydrates in her diet to help decrease the risk of diabetes. We dicussed metformin including benefits and risks. She was informed that eating too many simple carbohydrates or too many calories at one sitting increases the likelihood of GI side effects. Robin Knapp agrees to continue taking metformin and she agrees to follow up with our clinic in 2  weeks as directed to monitor her progress.  Hyperlipidemia Robin Knapp was informed of the American Heart Association Guidelines emphasizing intensive lifestyle modifications as the first line treatment for hyperlipidemia. We discussed many lifestyle modifications today in depth, and Robin Knapp will continue to work on decreasing saturated fats such as fatty red meat, butter and many fried foods. She will also increase vegetables and lean protein in her diet and continue to work on exercise and weight loss efforts. We will repeat labs in the next 2 months. Robin Knapp agrees to follow up with our clinic in 2 weeks.  I spent > than 50% of the 15 minute visit on counseling as documented in the note.  Obesity Robin Knapp is currently in the action stage of change. As such, her goal is to continue with weight loss efforts She has agreed to follow the Category 2 plan Robin Knapp has been instructed to work up to a goal of 150 minutes of combined cardio and strengthening exercise per week for weight loss and overall health benefits. We discussed the following Behavioral Modification Strategies today: increasing lean protein intake, increasing vegetables, work on meal planning and easy cooking plans, and planning for success   Robin Knapp has agreed to follow up with our clinic in 2 weeks. She was informed of the importance of frequent follow up visits to maximize her success with intensive lifestyle modifications for her multiple health conditions.  ALLERGIES: Allergies  Allergen Reactions  . Sulfa Antibiotics Hives    itching  . Sunscreens     MEDICATIONS: Current Outpatient Medications on  File Prior to Visit  Medication Sig Dispense Refill  . acetaminophen (TYLENOL 8 HOUR ARTHRITIS PAIN) 650 MG CR tablet Take 650 mg by mouth every 8 (eight) hours as needed for pain.    [name] albuterol (PROVENTIL HFA;VENTOLIN HFA) 108 (90 Base) MCG/ACT inhaler Inhale 2 puffs into the lungs every 6 (six) hours as needed for wheezing or  shortness of breath.    . cetirizine (ZYRTEC) 10 MG tablet Take 10 mg by mouth daily.    . Cholecalciferol (CVS VITAMIN D3) 10000 units CAPS Take by mouth.    . gabapentin (NEURONTIN) 100 MG capsule Take 3 capsules (300 mg total) by mouth at bedtime. 90 capsule 11  . ibuprofen (ADVIL,MOTRIN) 600 MG tablet Take 600 mg by mouth every 6 (six) hours as needed.    [name] levonorgestrel (MIRENA) 20 MCG/24HR IUD 1 each by Intrauterine route once.    . Magnesium 250 MG TABS Take 1 tablet by mouth daily.    . metFORMIN (GLUCOPHAGE) 500 MG tablet Take 1 tablet (500 mg total) by mouth daily with breakfast. 30 tablet 0  . montelukast (SINGULAIR) 10 MG tablet Take 10 mg by mouth at bedtime.    . Multiple Vitamins-Minerals (HAIR SKIN & NAILS ADVANCED PO) Take 1 tablet by mouth daily.    . Omega-3 Fatty Acids (OMEGA-3 FISH OIL) 1200 MG CAPS Take 2 capsules by mouth daily.    . Oral Electrolytes (REVITAL JELL CUPS PO) Take 1 capsule by mouth daily.     No current facility-administered medications on file prior to visit.     PAST MEDICAL HISTORY: Past Medical History:  Diagnosis Date  . Allergy   . Asthma   . Breast lump   . Constipation   . Dry skin   . Fatigue   . GERD (gastroesophageal reflux disease)    pregnancy related-zantac  . HTN (hypertension)   . Hyperlipidemia   . Infertility, female   . Joint pain   . Lactose intolerance   . Missed abortion    6weeks 3days  . Muscle pain   . Obesity   . Palpitations   . PCOS (polycystic ovarian syndrome)   . Seasonal allergies   . Skin rash   . Stress   . Vitamin D deficiency     PAST SURGICAL HISTORY: Past Surgical History:  Procedure Laterality Date  . BREAST BIOPSY    . CESAREAN SECTION    . DILATION AND EVACUATION  05/09/2012   Procedure: DILATATION AND EVACUATION;  Surgeon: Marvene Staff, MD;  Location: [facility];  Service: Gynecology;  Laterality: N/A;  . EYE SURGERY    . LASIK    . [place]    . WISDOM TOOTH EXTRACTION  1994     SOCIAL HISTORY: Social History   Tobacco Use  . Smoking status: Never Smoker  . Smokeless tobacco: Never Used  Substance Use Topics  . Alcohol use: No    Alcohol/week: 0.0 standard drinks  . Drug use: No    FAMILY HISTORY: Family History  Problem Relation Age of Onset  . Hyperlipidemia Mother   . Hyperlipidemia Father   . Hypertension Father   . Anxiety disorder Father   . Hyperlipidemia Maternal Grandmother   . Hypertension Maternal Grandmother   . Heart disease Maternal Grandmother   . Heart disease Maternal Grandfather   . Diabetes Maternal Grandfather   . Stroke Paternal Grandmother   . Mental illness Paternal Grandmother   . Heart disease Paternal Grandmother  ROS: Review of Systems  Constitutional: Positive for weight loss.  Cardiovascular: Negative for chest pain and claudication.  Musculoskeletal: Negative for myalgias.  Endo/Heme/Allergies:       Negative hypoglycemia    PHYSICAL EXAM: Blood pressure 135/81, pulse 73, temperature 97.8 F (36.6 C), temperature source Oral, height 5\' 5"  (1.651 m), weight 180 lb (81.6 kg), SpO2 99 %. Body mass index is 29.95 kg/m. Physical Exam Vitals signs reviewed.  Constitutional:      Appearance: Normal appearance. She is obese.  Cardiovascular:     Rate and Rhythm: Normal rate.     Pulses: Normal pulses.  Pulmonary:     Effort: Pulmonary effort is normal.  Musculoskeletal: Normal range of motion.  Skin:    General: Skin is warm and dry.  Neurological:     Mental Status: She is alert and oriented to person, place, and time.  Psychiatric:        Mood and Affect: Mood normal.        Behavior: Behavior normal.     RECENT LABS AND TESTS: BMET    Component Value Date/Time   NA 139 11/27/2017 1203   K 4.3 11/27/2017 1203   CL 99 11/27/2017 1203   CO2 24 11/27/2017 1203   GLUCOSE 84 11/27/2017 1203   GLUCOSE 91 04/06/2015 1715   BUN 11 11/27/2017 1203   CREATININE 0.89 11/27/2017 1203   CALCIUM  9.7 11/27/2017 1203   GFRNONAA 79 11/27/2017 1203   GFRAA 91 11/27/2017 1203   Lab Results  Component Value Date   HGBA1C 5.3 07/16/2018   HGBA1C 5.4 03/12/2018   HGBA1C 5.7 (H) 11/27/2017   Lab Results  Component Value Date   INSULIN 8.2 07/16/2018   INSULIN 6.7 03/12/2018   INSULIN 12.2 11/27/2017   CBC    Component Value Date/Time   WBC 8.0 11/27/2017 1203   WBC 10.4 04/06/2015 1715   RBC 4.54 11/27/2017 1203   RBC 4.58 04/06/2015 1715   HGB 13.6 11/27/2017 1203   HCT 38.8 11/27/2017 1203   PLT 343 04/06/2015 1715   MCV 86 11/27/2017 1203   MCH 30.0 11/27/2017 1203   MCH 30.3 04/06/2015 1715   MCHC 35.1 11/27/2017 1203   MCHC 33.7 04/06/2015 1715   RDW 14.5 11/27/2017 1203   LYMPHSABS 2.2 11/27/2017 1203   MONOABS 0.6 04/06/2015 1715   EOSABS 0.2 11/27/2017 1203   BASOSABS 0.0 11/27/2017 1203   Iron/TIBC/Ferritin/ %Sat No results found for: IRON, TIBC, FERRITIN, IRONPCTSAT Lipid Panel     Component Value Date/Time   CHOL 208 (H) 07/16/2018 0902   TRIG 104 07/16/2018 0902   HDL 47 07/16/2018 0902   LDLCALC 140 (H) 07/16/2018 0902   Hepatic Function Panel     Component Value Date/Time   PROT 7.8 11/27/2017 1203   ALBUMIN 4.8 11/27/2017 1203   AST 20 11/27/2017 1203   ALT 20 11/27/2017 1203   ALKPHOS 42 11/27/2017 1203   BILITOT 0.4 11/27/2017 1203      Component Value Date/Time   TSH 0.667 11/27/2017 1203   TSH 0.406  05/26/2009 1040      OBESITY BEHAVIORAL INTERVENTION VISIT  Today's visit was # 18   Starting weight: 199 lbs Starting date: 11/27/17 Today's weight : 180 lbs Today's date: 09/23/2018 Total lbs lost to date: 42    ASK: We discussed the diagnosis of obesity with Dulcey Octaviano Glow today and Chaniqua agreed to give [name] permission to discuss obesity behavioral modification therapy today.  ASSESS: Kahdijah has the diagnosis of obesity and her BMI today is 29.95 Yesika is in the action stage of change   ADVISE: Deanie was  educated on the multiple health risks of obesity as well as the benefit of weight loss to improve her health. She was advised of the need for long term treatment and the importance of lifestyle modifications to improve her current health and to decrease her risk of future health problems.  AGREE: Multiple dietary modification options and treatment options were discussed and  Zayra agreed to follow the recommendations documented in the above note.  ARRANGE: Kenia was educated on the importance of frequent visits to treat obesity as outlined per CMS and USPSTF guidelines and agreed to schedule her next follow up appointment today.  I, Trixie Dredge, am acting as transcriptionist for Ilene Qua, MD  I have reviewed the above documentation for accuracy and completeness, and I agree with the above. - Ilene Qua, MD

## 2018-10-07 ENCOUNTER — Ambulatory Visit (INDEPENDENT_AMBULATORY_CARE_PROVIDER_SITE_OTHER): Payer: No Typology Code available for payment source | Admitting: Family Medicine

## 2018-10-07 ENCOUNTER — Encounter (INDEPENDENT_AMBULATORY_CARE_PROVIDER_SITE_OTHER): Payer: Self-pay | Admitting: Family Medicine

## 2018-10-07 VITALS — BP 114/73 | HR 74 | Temp 97.8°F | Ht 65.0 in | Wt 181.0 lb

## 2018-10-07 DIAGNOSIS — R7303 Prediabetes: Secondary | ICD-10-CM | POA: Diagnosis not present

## 2018-10-07 DIAGNOSIS — Z6830 Body mass index (BMI) 30.0-30.9, adult: Secondary | ICD-10-CM | POA: Diagnosis not present

## 2018-10-07 DIAGNOSIS — E669 Obesity, unspecified: Secondary | ICD-10-CM | POA: Diagnosis not present

## 2018-10-07 DIAGNOSIS — E559 Vitamin D deficiency, unspecified: Secondary | ICD-10-CM

## 2018-10-08 NOTE — Progress Notes (Signed)
Office: [phone]  /  Fax: [phone]   HPI:   Chief Complaint: OBESITY Robin Knapp is here to discuss her progress with her obesity treatment plan. She is on the Category 2 plan and is following her eating plan approximately 60 % of the time. She states she is walking and doing cardio for 30 minutes 2 times per week. Robin Knapp reports that she has been doing some exercise, but hasn't been as aggressive in terms of consistency. She has been packing lunch and dinner if she is working and reports no hunger. Her weight is 181 lb (82.1 kg) today and has gained 1 pound since her last visit. She has lost 18 lbs since starting treatment with [name].  Pre-Diabetes Robin Knapp has a diagnosis of pre-diabetes based on her elevated Hgb A1c and was informed this puts her at greater risk of developing diabetes. She denies GI side effects of metformin and continues to work on diet and exercise to decrease risk of diabetes. She denies nausea, vomiting, or abdominal pain.  Vitamin D Deficiency Robin Knapp has a diagnosis of vitamin D deficiency. She is currently taking OTC Vit D. Last Vit D level was 66.4. She denies nausea, vomiting or muscle weakness, and notes improving fatigue.  ASSESSMENT AND PLAN:  Prediabetes  Vitamin D deficiency  Class 1 obesity with serious comorbidity and body mass index (BMI) of 30.0 to 30.9 in adult, unspecified obesity type  PLAN:  Pre-Diabetes Robin Knapp will continue to work on weight loss, exercise, and decreasing simple carbohydrates in her diet to help decrease the risk of diabetes. We dicussed metformin including benefits and risks. She was informed that eating too many simple carbohydrates or too many calories at one sitting increases the likelihood of GI side effects. Robin Knapp agrees to continue taking metformin, and she agrees to follow up with our clinic in 2 weeks as directed to monitor her progress.  Vitamin D Deficiency Robin Knapp was informed that low vitamin D levels  contributes to fatigue and are associated with obesity, breast, and colon cancer. Robin Knapp agrees to continue taking OTC Vit D and will follow up for routine testing of vitamin D, at least 2-3 times per year. She was informed of the risk of over-replacement of vitamin D and agrees to not increase her dose unless she discusses this with [name] first. We will repeat labs at next appointment. Robin Knapp agrees to follow up with our clinic in 2 weeks.  I spent > than 50% of the 15 minute visit on counseling as documented in the note.  Obesity Robin Knapp is currently in the action stage of change. As such, her goal is to continue with weight loss efforts She has agreed to follow the Category 2 plan Robin Knapp has been instructed to work up to a goal of 150 minutes of combined cardio and strengthening exercise per week for weight loss and overall health benefits. We discussed the following Behavioral Modification Strategies today: increasing lean protein intake, increasing vegetables, work on meal planning and easy cooking plans, and planning for success We will repeat IC and labs at next appointment.  Robin Knapp has agreed to follow up with our clinic in 2 weeks. She was informed of the importance of frequent follow up visits to maximize her success with intensive lifestyle modifications for her multiple health conditions.  ALLERGIES: Allergies  Allergen Reactions  . Sulfa Antibiotics Hives    itching  . Sunscreens     MEDICATIONS: Current Outpatient Medications on File Prior to Visit  Medication Sig Dispense Refill  .  acetaminophen (TYLENOL 8 HOUR ARTHRITIS PAIN) 650 MG CR tablet Take 650 mg by mouth every 8 (eight) hours as needed for pain.    [name] albuterol (PROVENTIL HFA;VENTOLIN HFA) 108 (90 Base) MCG/ACT inhaler Inhale 2 puffs into the lungs every 6 (six) hours as needed for wheezing or shortness of breath.    . cetirizine (ZYRTEC) 10 MG tablet Take 10 mg by mouth daily.    . Cholecalciferol ([employer] VITAMIN  D3) 10000 units CAPS Take by mouth.    . gabapentin (NEURONTIN) 100 MG capsule Take 3 capsules (300 mg total) by mouth at bedtime. 90 capsule 11  . ibuprofen (ADVIL,MOTRIN) 600 MG tablet Take 600 mg by mouth every 6 (six) hours as needed.    [name] levonorgestrel (MIRENA) 20 MCG/24HR IUD 1 each by Intrauterine route once.    . Magnesium 250 MG TABS Take 1 tablet by mouth daily.    . metFORMIN (GLUCOPHAGE) 500 MG tablet Take 1 tablet (500 mg total) by mouth daily with breakfast. 30 tablet 0  . montelukast (SINGULAIR) 10 MG tablet Take 10 mg by mouth at bedtime.    . Multiple Vitamins-Minerals (HAIR SKIN & NAILS ADVANCED PO) Take 1 tablet by mouth daily.    . Omega-3 Fatty Acids (OMEGA-3 FISH OIL) 1200 MG CAPS Take 2 capsules by mouth daily.    . Oral Electrolytes (REVITAL JELL CUPS PO) Take 1 capsule by mouth daily.     No current facility-administered medications on file prior to visit.     PAST MEDICAL HISTORY: Past Medical History:  Diagnosis Date  . Allergy   . Asthma   . Breast lump   . Constipation   . Dry skin   . Fatigue   . GERD (gastroesophageal reflux disease)    pregnancy related-zantac  . HTN (hypertension)   . Hyperlipidemia   . Infertility, female   . Joint pain   . Lactose intolerance   . Missed abortion    6weeks 3days  . Muscle pain   . Obesity   . Palpitations   . PCOS (polycystic ovarian syndrome)   . Seasonal allergies   . Skin rash   . Stress   . Vitamin D deficiency     PAST SURGICAL HISTORY: Past Surgical History:  Procedure Laterality Date  . BREAST BIOPSY    . CESAREAN SECTION    . DILATION AND EVACUATION  05/09/2012   Procedure: DILATATION AND EVACUATION;  Surgeon: Marvene Staff, MD;  Location: [facility];  Service: Gynecology;  Laterality: N/A;  . EYE SURGERY    . LASIK    . [place]    . WISDOM TOOTH EXTRACTION  1994    SOCIAL HISTORY: Social History   Tobacco Use  . Smoking status: Never Smoker  . Smokeless tobacco: Never Used    Substance Use Topics  . Alcohol use: No    Alcohol/week: 0.0 standard drinks  . Drug use: No    FAMILY HISTORY: Family History  Problem Relation Age of Onset  . Hyperlipidemia Mother   . Hyperlipidemia Father   . Hypertension Father   . Anxiety disorder Father   . Hyperlipidemia Maternal Grandmother   . Hypertension Maternal Grandmother   . Heart disease Maternal Grandmother   . Heart disease Maternal Grandfather   . Diabetes Maternal Grandfather   . Stroke Paternal Grandmother   . Mental illness Paternal Grandmother   . Heart disease Paternal Grandmother     ROS: Review of Systems  Constitutional: Positive for malaise/fatigue.  Negative for weight loss.  Gastrointestinal: Negative for abdominal pain, nausea and vomiting.  Musculoskeletal:       Negative muscle weakness    PHYSICAL EXAM: Blood pressure 114/73, pulse 74, temperature 97.8 F (36.6 C), temperature source Oral, height 5\' 5"  (1.651 m), weight 181 lb (82.1 kg), SpO2 97 %. Body mass index is 30.12 kg/m. Physical Exam Vitals signs reviewed.  Constitutional:      Appearance: Normal appearance. She is obese.  Cardiovascular:     Rate and Rhythm: Normal rate.     Pulses: Normal pulses.  Pulmonary:     Effort: Pulmonary effort is normal.     Breath sounds: Normal breath sounds.  Musculoskeletal: Normal range of motion.  Skin:    General: Skin is warm and dry.  Neurological:     Mental Status: She is alert and oriented to person, place, and time.  Psychiatric:        Mood and Affect: Mood normal.        Behavior: Behavior normal.     RECENT LABS AND TESTS: BMET    Component Value Date/Time   NA 139 11/27/2017 1203   K 4.3 11/27/2017 1203   CL 99 11/27/2017 1203   CO2 24 11/27/2017 1203   GLUCOSE 84 11/27/2017 1203   GLUCOSE 91 04/06/2015 1715   BUN 11 11/27/2017 1203   CREATININE 0.89 11/27/2017 1203   CALCIUM 9.7 11/27/2017 1203   GFRNONAA 79 11/27/2017 1203   GFRAA 91 11/27/2017 1203    Lab Results  Component Value Date   HGBA1C 5.3 07/16/2018   HGBA1C 5.4 03/12/2018   HGBA1C 5.7 (H) 11/27/2017   Lab Results  Component Value Date   INSULIN 8.2 07/16/2018   INSULIN 6.7 03/12/2018   INSULIN 12.2 11/27/2017   CBC    Component Value Date/Time   WBC 8.0 11/27/2017 1203   WBC 10.4 04/06/2015 1715   RBC 4.54 11/27/2017 1203   RBC 4.58 04/06/2015 1715   HGB 13.6 11/27/2017 1203   HCT 38.8 11/27/2017 1203   PLT 343 04/06/2015 1715   MCV 86 11/27/2017 1203   MCH 30.0 11/27/2017 1203   MCH 30.3 04/06/2015 1715   MCHC 35.1 11/27/2017 1203   MCHC 33.7 04/06/2015 1715   RDW 14.5 11/27/2017 1203   LYMPHSABS 2.2 11/27/2017 1203   MONOABS 0.6 04/06/2015 1715   EOSABS 0.2 11/27/2017 1203   BASOSABS 0.0 11/27/2017 1203   Iron/TIBC/Ferritin/ %Sat No results found for: IRON, TIBC, FERRITIN, IRONPCTSAT Lipid Panel     Component Value Date/Time   CHOL 208 (H) 07/16/2018 0902   TRIG 104 07/16/2018 0902   HDL 47 07/16/2018 0902   LDLCALC 140 (H) 07/16/2018 0902   Hepatic Function Panel     Component Value Date/Time   PROT 7.8 11/27/2017 1203   ALBUMIN 4.8 11/27/2017 1203   AST 20 11/27/2017 1203   ALT 20 11/27/2017 1203   ALKPHOS 42 11/27/2017 1203   BILITOT 0.4 11/27/2017 1203      Component Value Date/Time   TSH 0.667 11/27/2017 1203   TSH 0.406  05/26/2009 1040      OBESITY BEHAVIORAL INTERVENTION VISIT  Today's visit was # 19   Starting weight: 199 lbs Starting date: 11/27/17 Today's weight : 181 lbs  Today's date: 10/07/2018 Total lbs lost to date: 38    ASK: We discussed the diagnosis of obesity with Emilyn Octaviano Glow today and Nysia agreed to give [name] permission to discuss obesity behavioral modification therapy today.  ASSESS: Loeta has the diagnosis of obesity and her BMI today is 30.12 Odaly is in the action stage of change   ADVISE: Kyona was educated on the multiple health risks of obesity as well as the benefit of weight  loss to improve her health. She was advised of the need for long term treatment and the importance of lifestyle modifications to improve her current health and to decrease her risk of future health problems.  AGREE: Multiple dietary modification options and treatment options were discussed and  Lashala agreed to follow the recommendations documented in the above note.  ARRANGE: Amberlee was educated on the importance of frequent visits to treat obesity as outlined per CMS and USPSTF guidelines and agreed to schedule her next follow up appointment today.  I, Trixie Dredge, am acting as transcriptionist for Ilene Qua, MD  I have reviewed the above documentation for accuracy and completeness, and I agree with the above. - Ilene Qua, MD

## 2018-10-21 ENCOUNTER — Encounter (INDEPENDENT_AMBULATORY_CARE_PROVIDER_SITE_OTHER): Payer: Self-pay

## 2018-10-21 ENCOUNTER — Ambulatory Visit (INDEPENDENT_AMBULATORY_CARE_PROVIDER_SITE_OTHER): Payer: No Typology Code available for payment source | Admitting: Family Medicine

## 2018-11-11 ENCOUNTER — Ambulatory Visit (INDEPENDENT_AMBULATORY_CARE_PROVIDER_SITE_OTHER): Payer: No Typology Code available for payment source | Admitting: Family Medicine

## 2018-11-11 ENCOUNTER — Encounter (INDEPENDENT_AMBULATORY_CARE_PROVIDER_SITE_OTHER): Payer: Self-pay | Admitting: Family Medicine

## 2018-11-11 VITALS — BP 122/74 | HR 89 | Temp 98.0°F | Ht 65.0 in | Wt 185.0 lb

## 2018-11-11 DIAGNOSIS — Z9189 Other specified personal risk factors, not elsewhere classified: Secondary | ICD-10-CM

## 2018-11-11 DIAGNOSIS — E7849 Other hyperlipidemia: Secondary | ICD-10-CM | POA: Diagnosis not present

## 2018-11-11 DIAGNOSIS — Z6830 Body mass index (BMI) 30.0-30.9, adult: Secondary | ICD-10-CM | POA: Diagnosis not present

## 2018-11-11 DIAGNOSIS — E669 Obesity, unspecified: Secondary | ICD-10-CM | POA: Diagnosis not present

## 2018-11-11 DIAGNOSIS — R7303 Prediabetes: Secondary | ICD-10-CM

## 2018-11-11 MED ORDER — METFORMIN HCL 500 MG PO TABS: 500.0000 mg | ORAL_TABLET | Freq: Every day | ORAL | 0 refills | Status: DC

## 2018-11-12 NOTE — Progress Notes (Signed)
Office: [phone]  /  Fax: [phone]   HPI:   Chief Complaint: OBESITY Robin Knapp is here to discuss her progress with her obesity treatment plan. She is on the Category 2 plan and is following her eating plan approximately 40-50 % of the time. She states she is doing zumba for 50 minutes 2 times per week. Robin Knapp voices concern about sabotaging herself with overindulging and also feeling ravenous between meals.  Her weight is 185 lb (83.9 kg) today and has gained 4 pounds since her last visit. She has lost 14 lbs since starting treatment with [name].  Hyperlipidemia Robin Knapp has hyperlipidemia and has been trying to improve her cholesterol levels with intensive lifestyle modification including a low saturated fat diet, exercise and weight loss. Her LDL was elevated at last blood draw. She denies any chest pain, claudication or myalgias.  Pre-Diabetes Robin Knapp has a diagnosis of pre-diabetes based on her elevated Hgb A1c and was informed this puts her at greater risk of developing diabetes. She notes carbohydrate cravings and denies GI side effects of metformin. She continues to work on diet and exercise to decrease risk of diabetes. She denies hypoglycemia.  At risk for diabetes Robin Knapp is at higher than average risk for developing diabetes due to her obesity and pre-diabetes. She currently denies polyuria or polydipsia.  ASSESSMENT AND PLAN:  Other hyperlipidemia  Prediabetes - Plan: metFORMIN (GLUCOPHAGE) 500 MG tablet  At risk for diabetes mellitus  Class 1 obesity with serious comorbidity and body mass index (BMI) of 30.0 to 30.9 in adult, unspecified obesity type  PLAN:  Hyperlipidemia Robin Knapp was informed of the American Heart Association Guidelines emphasizing intensive lifestyle modifications as the first line treatment for hyperlipidemia. We discussed many lifestyle modifications today in depth, and Robin Knapp will continue to work on decreasing saturated fats such as fatty  red meat, butter and many fried foods. She will also increase vegetables and lean protein in her diet and continue to work on exercise and weight loss efforts. We will repeat FLP at next appointment. Robin Knapp agrees to follow up with our clinic in 2 weeks.  Pre-Diabetes Robin Knapp will continue to work on weight loss, exercise, and decreasing simple carbohydrates in her diet to help decrease the risk of diabetes. We dicussed metformin including benefits and risks. She was informed that eating too many simple carbohydrates or too many calories at one sitting increases the likelihood of GI side effects. Robin Knapp agrees to continue taking metformin 500 mg PO q AM #30 and we will refill for 1 month. Robin Knapp agrees to follow up with our clinic in 2 weeks as directed to monitor her progress.  Diabetes risk counselling Robin Knapp was given extended (15 minutes) diabetes prevention counseling today. She is 48 y.o. female and has risk factors for diabetes including obesity and pre-diabetes. We discussed intensive lifestyle modifications today with an emphasis on weight loss as well as increasing exercise and decreasing simple carbohydrates in her diet.  Obesity Robin Knapp is currently in the action stage of change. As such, her goal is to continue with weight loss efforts She has agreed to follow the Category 2 plan or follow the Pescatarian eating plan Robin Knapp to do 5 out of 7 days on either Category 2 or Pescatarian 100%, then breakfast and lunch the other 2 days. Robin Knapp has been instructed to work up to a goal of 150 minutes of combined cardio and strengthening exercise per week for weight loss and overall health benefits. We discussed the following Behavioral Modification Strategies  today: increasing lean protein intake, increasing vegetables and work on meal planning and easy cooking plans, and planning for success   Robin Knapp has agreed to follow up with our clinic in 2 weeks. She was informed of the importance of  frequent follow up visits to maximize her success with intensive lifestyle modifications for her multiple health conditions.  ALLERGIES: Allergies  Allergen Reactions  . Sulfa Antibiotics Hives    itching  . Sunscreens     MEDICATIONS: Current Outpatient Medications on File Prior to Visit  Medication Sig Dispense Refill  . acetaminophen (TYLENOL 8 HOUR ARTHRITIS PAIN) 650 MG CR tablet Take 650 mg by mouth every 8 (eight) hours as needed for pain.    [name] albuterol (PROVENTIL HFA;VENTOLIN HFA) 108 (90 Base) MCG/ACT inhaler Inhale 2 puffs into the lungs every 6 (six) hours as needed for wheezing or shortness of breath.    . cetirizine (ZYRTEC) 10 MG tablet Take 10 mg by mouth daily.    . Cholecalciferol ([employer]S VITAMIN D3) 10000 units CAPS Take by mouth.    . gabapentin (NEURONTIN) 100 MG capsule Take 3 capsules (300 mg total) by mouth at bedtime. 90 capsule 11  . ibuprofen (ADVIL,MOTRIN) 600 MG tablet Take 600 mg by mouth every 6 (six) hours as needed.    [name] levonorgestrel (MIRENA) 20 MCG/24HR IUD 1 each by Intrauterine route once.    . Magnesium 250 MG TABS Take 1 tablet by mouth daily.    . montelukast (SINGULAIR) 10 MG tablet Take 10 mg by mouth at bedtime.    . Multiple Vitamins-Minerals (HAIR SKIN & NAILS ADVANCED PO) Take 1 tablet by mouth daily.    . Omega-3 Fatty Acids (OMEGA-3 FISH OIL) 1200 MG CAPS Take 2 capsules by mouth daily.    . Oral Electrolytes (REVITAL JELL CUPS PO) Take 1 capsule by mouth daily.     No current facility-administered medications on file prior to visit.     PAST MEDICAL HISTORY: Past Medical History:  Diagnosis Date  . Allergy   . Asthma   . Breast lump   . Constipation   . Dry skin   . Fatigue   . GERD (gastroesophageal reflux disease)    pregnancy related-zantac  . HTN (hypertension)   . Hyperlipidemia   . Infertility, female   . Joint pain   . Lactose intolerance   . Missed abortion    6weeks 3days  . Muscle pain   . Obesity   .  Palpitations   . PCOS (polycystic ovarian syndrome)   . Seasonal allergies   . Skin rash   . Stress   . Vitamin D deficiency     PAST SURGICAL HISTORY: Past Surgical History:  Procedure Laterality Date  . BREAST BIOPSY    . CESAREAN SECTION    . DILATION AND EVACUATION  05/09/2012   Procedure: DILATATION AND EVACUATION;  Surgeon: Marvene Staff, MD;  Location: [facility];  Service: Gynecology;  Laterality: N/A;  . EYE SURGERY    . LASIK    . [place]    . WISDOM TOOTH EXTRACTION  1994    SOCIAL HISTORY: Social History   Tobacco Use  . Smoking status: Never Smoker  . Smokeless tobacco: Never Used  Substance Use Topics  . Alcohol use: No    Alcohol/week: 0.0 standard drinks  . Drug use: No    FAMILY HISTORY: Family History  Problem Relation Age of Onset  . Hyperlipidemia Mother   . Hyperlipidemia Father   .  Hypertension Father   . Anxiety disorder Father   . Hyperlipidemia Maternal Grandmother   . Hypertension Maternal Grandmother   . Heart disease Maternal Grandmother   . Heart disease Maternal Grandfather   . Diabetes Maternal Grandfather   . Stroke Paternal Grandmother   . Mental illness Paternal Grandmother   . Heart disease Paternal Grandmother     ROS: Review of Systems  Constitutional: Negative for weight loss.  Cardiovascular: Negative for chest pain and claudication.  Genitourinary: Negative for frequency.  Musculoskeletal: Negative for myalgias.  Endo/Heme/Allergies: Negative for polydipsia.       Negative hypoglycemia    PHYSICAL EXAM: Blood pressure 122/74, pulse 89, temperature 98 F (36.7 C), temperature source Oral, height 5\' 5"  (1.651 m), weight 185 lb (83.9 kg), SpO2 98 %. Body mass index is 30.79 kg/m. Physical Exam Vitals signs reviewed.  Constitutional:      Appearance: Normal appearance. She is obese.  Cardiovascular:     Rate and Rhythm: Normal rate.     Pulses: Normal pulses.  Pulmonary:     Effort: Pulmonary effort is normal.       Breath sounds: Normal breath sounds.  Musculoskeletal: Normal range of motion.  Skin:    General: Skin is warm and dry.  Neurological:     Mental Status: She is alert and oriented to person, place, and time.  Psychiatric:        Mood and Affect: Mood normal.        Behavior: Behavior normal.     RECENT LABS AND TESTS: BMET    Component Value Date/Time   NA 139 11/27/2017 1203   K 4.3 11/27/2017 1203   CL 99 11/27/2017 1203   CO2 24 11/27/2017 1203   GLUCOSE 84 11/27/2017 1203   GLUCOSE 91 04/06/2015 1715   BUN 11 11/27/2017 1203   CREATININE 0.89 11/27/2017 1203   CALCIUM 9.7 11/27/2017 1203   GFRNONAA 79 11/27/2017 1203   GFRAA 91 11/27/2017 1203   Lab Results  Component Value Date   HGBA1C 5.3 07/16/2018   HGBA1C 5.4 03/12/2018   HGBA1C 5.7 (H) 11/27/2017   Lab Results  Component Value Date   INSULIN 8.2 07/16/2018   INSULIN 6.7 03/12/2018   INSULIN 12.2 11/27/2017   CBC    Component Value Date/Time   WBC 8.0 11/27/2017 1203   WBC 10.4 04/06/2015 1715   RBC 4.54 11/27/2017 1203   RBC 4.58 04/06/2015 1715   HGB 13.6 11/27/2017 1203   HCT 38.8 11/27/2017 1203   PLT 343 04/06/2015 1715   MCV 86 11/27/2017 1203   MCH 30.0 11/27/2017 1203   MCH 30.3 04/06/2015 1715   MCHC 35.1 11/27/2017 1203   MCHC 33.7 04/06/2015 1715   RDW 14.5 11/27/2017 1203   LYMPHSABS 2.2 11/27/2017 1203   MONOABS 0.6 04/06/2015 1715   EOSABS 0.2 11/27/2017 1203   BASOSABS 0.0 11/27/2017 1203   Iron/TIBC/Ferritin/ %Sat No results found for: IRON, TIBC, FERRITIN, IRONPCTSAT Lipid Panel     Component Value Date/Time   CHOL 208 (H) 07/16/2018 0902   TRIG 104 07/16/2018 0902   HDL 47 07/16/2018 0902   LDLCALC 140 (H) 07/16/2018 0902   Hepatic Function Panel     Component Value Date/Time   PROT 7.8 11/27/2017 1203   ALBUMIN 4.8 11/27/2017 1203   AST 20 11/27/2017 1203   ALT 20 11/27/2017 1203   ALKPHOS 42 11/27/2017 1203   BILITOT 0.4 11/27/2017 1203      Component  Value Date/Time   TSH 0.667 11/27/2017 1203   TSH 0.406  05/26/2009 1040      OBESITY BEHAVIORAL INTERVENTION VISIT  Today's visit was # 20   Starting weight: 199 lbs Starting date: 11/27/17 Today's weight : 185 lbs  Today's date: 11/11/2018 Total lbs lost to date: 14    11/11/2018  Height 5\' 5"  (1.651 m)  Weight 185 lb (83.9 kg)  BMI (Calculated) 30.79  BLOOD PRESSURE - SYSTOLIC 290  BLOOD PRESSURE - DIASTOLIC 74   Body Fat % 21.1 %  Total Body Water (lbs) 78 lbs     ASK: We discussed the diagnosis of obesity with Briellah Octaviano Glow today and Hilja agreed to give [name] permission to discuss obesity behavioral modification therapy today.  ASSESS: Vidhi has the diagnosis of obesity and her BMI today is 30.79 Finleigh is in the action stage of change   ADVISE: Shawnell was educated on the multiple health risks of obesity as well as the benefit of weight loss to improve her health. She was advised of the need for long term treatment and the importance of lifestyle modifications to improve her current health and to decrease her risk of future health problems.  AGREE: Multiple dietary modification options and treatment options were discussed and  Pressley agreed to follow the recommendations documented in the above note.  ARRANGE: Rozann was educated on the importance of frequent visits to treat obesity as outlined per CMS and USPSTF guidelines and agreed to schedule her next follow up appointment today.  I, Trixie Dredge, am acting as transcriptionist for Ilene Qua, MD  I have reviewed the above documentation for accuracy and completeness, and I agree with the above. - Ilene Qua, MD

## 2018-12-02 ENCOUNTER — Encounter (INDEPENDENT_AMBULATORY_CARE_PROVIDER_SITE_OTHER): Payer: Self-pay | Admitting: Family Medicine

## 2018-12-02 ENCOUNTER — Ambulatory Visit (INDEPENDENT_AMBULATORY_CARE_PROVIDER_SITE_OTHER): Payer: No Typology Code available for payment source | Admitting: Family Medicine

## 2018-12-02 ENCOUNTER — Other Ambulatory Visit: Payer: Self-pay

## 2018-12-02 VITALS — BP 123/78 | HR 69 | Temp 97.9°F | Ht 65.0 in | Wt 183.0 lb

## 2018-12-02 DIAGNOSIS — E559 Vitamin D deficiency, unspecified: Secondary | ICD-10-CM

## 2018-12-02 DIAGNOSIS — R7303 Prediabetes: Secondary | ICD-10-CM | POA: Diagnosis not present

## 2018-12-02 DIAGNOSIS — Z9189 Other specified personal risk factors, not elsewhere classified: Secondary | ICD-10-CM | POA: Diagnosis not present

## 2018-12-02 DIAGNOSIS — E669 Obesity, unspecified: Secondary | ICD-10-CM | POA: Diagnosis not present

## 2018-12-02 DIAGNOSIS — Z6830 Body mass index (BMI) 30.0-30.9, adult: Secondary | ICD-10-CM

## 2018-12-02 MED ORDER — METFORMIN HCL 500 MG PO TABS: 500.0000 mg | ORAL_TABLET | Freq: Every day | ORAL | 0 refills | Status: DC

## 2018-12-02 NOTE — Progress Notes (Signed)
Office: [phone]  /  Fax: [phone]   HPI:   Chief Complaint: OBESITY Robin Knapp is here to discuss her progress with her obesity treatment plan. She is on the Category 2 plan or follow the Pescatarian eating plan and is following her eating plan approximately 50 % of the time. She states she is walking for 60 minutes 5 times per week. [name] went to a conference in the past few weeks, so she sis indulge a bit. She felt the Pescatarian meal plan meals to be too big, so she struggled to eat all the food.  Her weight is 183 lb (83 kg) today and has had a weight loss of 2 pounds over a period of 3 weeks since her last visit. She has lost 16 lbs since starting treatment with [name].  Pre-Diabetes [name] has a diagnosis of pre-diabetes based on her elevated Hgb A1c and was informed this puts her at greater risk of developing diabetes. She notes carbohydrate cravings and indulgences, and denies GI side effects of metformin. She continues to work on diet and exercise to decrease risk of diabetes. She denies hypoglycemia.  At risk for diabetes [name] is at higher than average risk for developing diabetes due to her obesity and pre-diabetes. She currently denies polyuria or polydipsia.  Vitamin D Deficiency [name] has a diagnosis of vitamin D deficiency. She is currently taking OTC Vit D. She notes fatigue and denies nausea, vomiting or muscle weakness.  ASSESSMENT AND PLAN:  Prediabetes - Plan: metFORMIN (GLUCOPHAGE) 500 MG tablet  Vitamin D deficiency  At risk for diabetes mellitus  Class 1 obesity with serious comorbidity and body mass index (BMI) of 30.0 to 30.9 in adult, unspecified obesity type  PLAN:  Pre-Diabetes [name] will continue to work on weight loss, exercise, and decreasing simple carbohydrates in her diet to help decrease the risk of diabetes. We dicussed metformin including benefits and risks. She was informed that eating too many simple carbohydrates or too many  calories at one sitting increases the likelihood of GI side effects. [name] agrees to continue taking metformin 500 mg PO q Am #30 and we will refill for 1 month. [name] agrees to follow up with or clinic in 2 weeks as directed to monitor her progress.  Diabetes risk counseling [name] was given extended (15 minutes) diabetes prevention counseling today. She is 48 y.o. female and has risk factors for diabetes including obesity and pre-diabetes. We discussed intensive lifestyle modifications today with an emphasis on weight loss as well as increasing exercise and decreasing simple carbohydrates in her diet.  Vitamin D Deficiency [name] was informed that low vitamin D levels contributes to fatigue and are associated with obesity, breast, and colon cancer. [name] agrees to continue taking OTC supplementation and will follow up for routine testing of vitamin D, at least 2-3 times per year. She was informed of the risk of over-replacement of vitamin D and agrees to not increase her dose unless she discusses this with [name] first. [name] agrees to follow up with our clinic in 2 weeks.  Obesity [name] is currently in the action stage of change. As such, her goal is to continue with weight loss efforts She has agreed to keep a food journal with 1150-1300 calories and 80+ grams of protein daily or follow the Category 2 plan [name] has been instructed to work up to a goal of 150 minutes of combined cardio and strengthening exercise per week for weight loss and overall health benefits. We discussed the following  Behavioral Modification Strategies today: increasing lean protein intake, increasing vegetables, work on meal planning and easy cooking plans, better snacking choices, and planning for success   [name] has agreed to follow up with our clinic in 2 weeks. She was informed of the importance of frequent follow up visits to maximize her success with intensive lifestyle modifications for her multiple  health conditions.  ALLERGIES: Allergies  Allergen Reactions  . Sulfa Antibiotics Hives    itching  . Sunscreens     MEDICATIONS: Current Outpatient Medications on File Prior to Visit  Medication Sig Dispense Refill  . acetaminophen (TYLENOL 8 HOUR ARTHRITIS PAIN) 650 MG CR tablet Take 650 mg by mouth every 8 (eight) hours as needed for pain.    [name] albuterol (PROVENTIL HFA;VENTOLIN HFA) 108 (90 Base) MCG/ACT inhaler Inhale 2 puffs into the lungs every 6 (six) hours as needed for wheezing or shortness of breath.    . cetirizine (ZYRTEC) 10 MG tablet Take 10 mg by mouth daily.    . Cholecalciferol ([employer] VITAMIN D3) 10000 units CAPS Take by mouth.    . gabapentin (NEURONTIN) 100 MG capsule Take 3 capsules (300 mg total) by mouth at bedtime. 90 capsule 11  . ibuprofen (ADVIL,MOTRIN) 600 MG tablet Take 600 mg by mouth every 6 (six) hours as needed.    [name] levonorgestrel (MIRENA) 20 MCG/24HR IUD 1 each by Intrauterine route once.    . Magnesium 250 MG TABS Take 1 tablet by mouth daily.    . montelukast (SINGULAIR) 10 MG tablet Take 10 mg by mouth at bedtime.    . Multiple Vitamins-Minerals (HAIR SKIN & NAILS ADVANCED PO) Take 1 tablet by mouth daily.    . Omega-3 Fatty Acids (OMEGA-3 FISH OIL) 1200 MG CAPS Take 2 capsules by mouth daily.    . Oral Electrolytes (REVITAL JELL CUPS PO) Take 1 capsule by mouth daily.     No current facility-administered medications on file prior to visit.     PAST MEDICAL HISTORY: Past Medical History:  Diagnosis Date  . Allergy   . Asthma   . Breast lump   . Constipation   . Dry skin   . Fatigue   . GERD (gastroesophageal reflux disease)    pregnancy related-zantac  . HTN (hypertension)   . Hyperlipidemia   . Infertility, female   . Joint pain   . Lactose intolerance   . Missed abortion    6weeks 3days  . Muscle pain   . Obesity   . Palpitations   . PCOS (polycystic ovarian syndrome)   . Seasonal allergies   . Skin rash   . Stress   . Vitamin  D deficiency     PAST SURGICAL HISTORY: Past Surgical History:  Procedure Laterality Date  . BREAST BIOPSY    . CESAREAN SECTION    . DILATION AND EVACUATION  05/09/2012   Procedure: DILATATION AND EVACUATION;  Surgeon: Marvene Staff, MD;  Location: [facility];  Service: Gynecology;  Laterality: N/A;  . EYE SURGERY    . LASIK    . [place]    . WISDOM TOOTH EXTRACTION  1994    SOCIAL HISTORY: Social History   Tobacco Use  . Smoking status: Never Smoker  . Smokeless tobacco: Never Used  Substance Use Topics  . Alcohol use: No    Alcohol/week: 0.0 standard drinks  . Drug use: No    FAMILY HISTORY: Family History  Problem Relation Age of Onset  . Hyperlipidemia Mother   .  Hyperlipidemia Father   . Hypertension Father   . Anxiety disorder Father   . Hyperlipidemia Maternal Grandmother   . Hypertension Maternal Grandmother   . Heart disease Maternal Grandmother   . Heart disease Maternal Grandfather   . Diabetes Maternal Grandfather   . Stroke Paternal Grandmother   . Mental illness Paternal Grandmother   . Heart disease Paternal Grandmother     ROS: Review of Systems  Constitutional: Positive for weight loss.  Gastrointestinal: Negative for nausea and vomiting.  Genitourinary: Negative for frequency.  Musculoskeletal:       Negative muscle weakness  Endo/Heme/Allergies: Negative for polydipsia.       Negative hypoglycemia    PHYSICAL EXAM: Blood pressure 123/78, pulse 69, temperature 97.9 F (36.6 C), temperature source Oral, height 5\' 5"  (1.651 m), weight 183 lb (83 kg), SpO2 98 %. Body mass index is 30.45 kg/m. Physical Exam Vitals signs reviewed.  Constitutional:      Appearance: Normal appearance. She is obese.  Cardiovascular:     Rate and Rhythm: Normal rate.     Pulses: Normal pulses.  Pulmonary:     Effort: Pulmonary effort is normal.     Breath sounds: Normal breath sounds.  Musculoskeletal: Normal range of motion.  Skin:    General: Skin is  warm and dry.  Neurological:     Mental Status: She is alert and oriented to person, place, and time.  Psychiatric:        Mood and Affect: Mood normal.        Behavior: Behavior normal.     RECENT LABS AND TESTS: BMET    Component Value Date/Time   NA 139 11/27/2017 1203   K 4.3 11/27/2017 1203   CL 99 11/27/2017 1203   CO2 24 11/27/2017 1203   GLUCOSE 84 11/27/2017 1203   GLUCOSE 91 04/06/2015 1715   BUN 11 11/27/2017 1203   CREATININE 0.89 11/27/2017 1203   CALCIUM 9.7 11/27/2017 1203   GFRNONAA 79 11/27/2017 1203   GFRAA 91 11/27/2017 1203   Lab Results  Component Value Date   HGBA1C 5.3 07/16/2018   HGBA1C 5.4 03/12/2018   HGBA1C 5.7 (H) 11/27/2017   Lab Results  Component Value Date   INSULIN 8.2 07/16/2018   INSULIN 6.7 03/12/2018   INSULIN 12.2 11/27/2017   CBC    Component Value Date/Time   WBC 8.0 11/27/2017 1203   WBC 10.4 04/06/2015 1715   RBC 4.54 11/27/2017 1203   RBC 4.58 04/06/2015 1715   HGB 13.6 11/27/2017 1203   HCT 38.8 11/27/2017 1203   PLT 343 04/06/2015 1715   MCV 86 11/27/2017 1203   MCH 30.0 11/27/2017 1203   MCH 30.3 04/06/2015 1715   MCHC 35.1 11/27/2017 1203   MCHC 33.7 04/06/2015 1715   RDW 14.5 11/27/2017 1203   LYMPHSABS 2.2 11/27/2017 1203   MONOABS 0.6 04/06/2015 1715   EOSABS 0.2 11/27/2017 1203   BASOSABS 0.0 11/27/2017 1203   Iron/TIBC/Ferritin/ %Sat No results found for: IRON, TIBC, FERRITIN, IRONPCTSAT Lipid Panel     Component Value Date/Time   CHOL 208 (H) 07/16/2018 0902   TRIG 104 07/16/2018 0902   HDL 47 07/16/2018 0902   LDLCALC 140 (H) 07/16/2018 0902   Hepatic Function Panel     Component Value Date/Time   PROT 7.8 11/27/2017 1203   ALBUMIN 4.8 11/27/2017 1203   AST 20 11/27/2017 1203   ALT 20 11/27/2017 1203   ALKPHOS 42 11/27/2017 1203   BILITOT 0.4  11/27/2017 1203      Component Value Date/Time   TSH 0.667 11/27/2017 1203   TSH 0.406  05/26/2009 1040      OBESITY BEHAVIORAL  INTERVENTION VISIT  Today's visit was # 21   Starting weight: 199 lbs Starting date: 11/27/17 Today's weight : 183 lbs Today's date: 12/02/2018 Total lbs lost to date: 16    ASK: We discussed the diagnosis of obesity with Graceann Congress today and Metztli agreed to give [name] permission to discuss obesity behavioral modification therapy today.  ASSESS: Ronnae has the diagnosis of obesity and her BMI today is 30.45 Mady is in the action stage of change   ADVISE: Valita was educated on the multiple health risks of obesity as well as the benefit of weight loss to improve her health. She was advised of the need for long term treatment and the importance of lifestyle modifications to improve her current health and to decrease her risk of future health problems.  AGREE: Multiple dietary modification options and treatment options were discussed and  Lyanne agreed to follow the recommendations documented in the above note.  ARRANGE: Chantia was educated on the importance of frequent visits to treat obesity as outlined per CMS and USPSTF guidelines and agreed to schedule her next follow up appointment today.  I, Trixie Dredge, am acting as transcriptionist for Ilene Qua, MD  I have reviewed the above documentation for accuracy and completeness, and I agree with the above. - Ilene Qua, MD

## 2018-12-03 ENCOUNTER — Encounter (INDEPENDENT_AMBULATORY_CARE_PROVIDER_SITE_OTHER): Payer: Self-pay

## 2018-12-09 ENCOUNTER — Encounter (INDEPENDENT_AMBULATORY_CARE_PROVIDER_SITE_OTHER): Payer: Self-pay

## 2018-12-23 ENCOUNTER — Other Ambulatory Visit: Payer: Self-pay

## 2018-12-23 ENCOUNTER — Encounter (INDEPENDENT_AMBULATORY_CARE_PROVIDER_SITE_OTHER): Payer: Self-pay | Admitting: Family Medicine

## 2018-12-23 ENCOUNTER — Ambulatory Visit (INDEPENDENT_AMBULATORY_CARE_PROVIDER_SITE_OTHER): Payer: No Typology Code available for payment source | Admitting: Family Medicine

## 2018-12-23 DIAGNOSIS — R7303 Prediabetes: Secondary | ICD-10-CM

## 2018-12-23 DIAGNOSIS — E7849 Other hyperlipidemia: Secondary | ICD-10-CM | POA: Diagnosis not present

## 2018-12-23 DIAGNOSIS — Z6830 Body mass index (BMI) 30.0-30.9, adult: Secondary | ICD-10-CM | POA: Diagnosis not present

## 2018-12-23 DIAGNOSIS — E669 Obesity, unspecified: Secondary | ICD-10-CM | POA: Diagnosis not present

## 2018-12-23 MED ORDER — METFORMIN HCL 500 MG PO TABS: 500.0000 mg | ORAL_TABLET | Freq: Every day | ORAL | 0 refills | Status: DC

## 2018-12-23 MED FILL — metFORMIN HCL 500 MG TABS: 500 | 90 days supply | Qty: 90 | Fill #0

## 2018-12-23 NOTE — Progress Notes (Signed)
Office: [phone]  /  Fax: [phone] TeleHealth Visit:  IMAGINE NEST has verbally consented to this TeleHealth visit today. The patient is located at home, the provider is located at the [facility] office. The participants in this visit include the listed provider and patient and provider's assistant. The visit was conducted today via face time.  HPI:   Chief Complaint: OBESITY Robin Knapp is here to discuss her progress with her obesity treatment plan. She is on the keep a food journal with 1150-1300 calories and 80+ grams of protein daily or follow the Category 2 plan and is following her eating plan approximately 10 % of the time. She states she is walking and doing zumba for 30-60 minutes 3 times per week. Robin Knapp is having significant increased stress with COVID-19 crisis. Childcare has been a big stressor and so has risk of exposure. She is doing significant stress eating and snacking.  We were unable to weigh the patient today for this TeleHealth visit. She feels as if she has gained 2-3 lbs since her last visit. She has lost 16 lbs since starting treatment with [name].  Pre-Diabetes Robin Knapp has a diagnosis of pre-diabetes based on her elevated Hgb A1c and was informed this puts her at greater risk of developing diabetes. She notes carbohydrate cravings and stress eating, and denies GI side effects of metformin. She continues to work on diet and exercise to decrease risk of diabetes. She denies hypoglycemia.  Hyperlipidemia Robin Knapp has hyperlipidemia and has been trying to improve her cholesterol levels with intensive lifestyle modification including a low saturated fat diet, exercise and weight loss. Last LDL was elevated again in October 2019. She is not on statin and denies any chest pain, claudication or myalgias.  ASSESSMENT AND PLAN:  Prediabetes - Plan: metFORMIN (GLUCOPHAGE) 500 MG tablet  Other hyperlipidemia  Class 1 obesity with serious comorbidity and  body mass index (BMI) of 30.0 to 30.9 in adult, unspecified obesity type  PLAN:  Pre-Diabetes Robin Knapp will continue to work on weight loss, exercise, and decreasing simple carbohydrates in her diet to help decrease the risk of diabetes. We dicussed metformin including benefits and risks. She was informed that eating too many simple carbohydrates or too many calories at one sitting increases the likelihood of GI side effects. Consepcion agrees to continue taking metformin 500 mg PO daily #90 day supply with no refills. Robin Knapp agrees to follow up with our clinic in 2 weeks as directed to monitor her progress.  Hyperlipidemia Robin Knapp was informed of the American Heart Association Guidelines emphasizing intensive lifestyle modifications as the first line treatment for hyperlipidemia. We discussed many lifestyle modifications today in depth, and Yukiko will continue to work on decreasing saturated fats such as fatty red meat, butter and many fried foods. She will also increase vegetables and lean protein in her diet and continue to work on diet, exercise, and weight loss efforts. We will repeat labs at next appointment. Robin Knapp agrees to follow up with our clinic in 2 weeks.  Obesity Robin Knapp is currently in the action stage of change. As such, her goal is to continue with weight loss efforts She has agreed to follow the Category 2 plan Robin Knapp has been instructed to work up to a goal of 150 minutes of combined cardio and strengthening exercise per week for weight loss and overall health benefits. We discussed the following Behavioral Modification Strategies today: increasing lean protein intake, increasing vegetables, work on meal planning and easy cooking plans, ways  to avoid boredom eating, better snacking choices, and planning for success   Robin Knapp has agreed to follow up with our clinic in 2 weeks. She was informed of the importance of frequent follow up visits to maximize her success with intensive  lifestyle modifications for her multiple health conditions.  ALLERGIES: Allergies  Allergen Reactions  . Sulfa Antibiotics Hives    itching  . Sunscreens     MEDICATIONS: Current Outpatient Medications on File Prior to Visit  Medication Sig Dispense Refill  . acetaminophen (TYLENOL 8 HOUR ARTHRITIS PAIN) 650 MG CR tablet Take 650 mg by mouth every 8 (eight) hours as needed for pain.    [name] albuterol (PROVENTIL HFA;VENTOLIN HFA) 108 (90 Base) MCG/ACT inhaler Inhale 2 puffs into the lungs every 6 (six) hours as needed for wheezing or shortness of breath.    . cetirizine (ZYRTEC) 10 MG tablet Take 10 mg by mouth daily.    . Cholecalciferol ([employer]S VITAMIN D3) 10000 units CAPS Take by mouth.    . gabapentin (NEURONTIN) 100 MG capsule Take 3 capsules (300 mg total) by mouth at bedtime. 90 capsule 11  . ibuprofen (ADVIL,MOTRIN) 600 MG tablet Take 600 mg by mouth every 6 (six) hours as needed.    [name] levonorgestrel (MIRENA) 20 MCG/24HR IUD 1 each by Intrauterine route once.    . Magnesium 250 MG TABS Take 1 tablet by mouth daily.    . montelukast (SINGULAIR) 10 MG tablet Take 10 mg by mouth at bedtime.    . Multiple Vitamins-Minerals (HAIR SKIN & NAILS ADVANCED PO) Take 1 tablet by mouth daily.    . Omega-3 Fatty Acids (OMEGA-3 FISH OIL) 1200 MG CAPS Take 2 capsules by mouth daily.    . Oral Electrolytes (REVITAL JELL CUPS PO) Take 1 capsule by mouth daily.     No current facility-administered medications on file prior to visit.     PAST MEDICAL HISTORY: Past Medical History:  Diagnosis Date  . Allergy   . Asthma   . Breast lump   . Constipation   . Dry skin   . Fatigue   . GERD (gastroesophageal reflux disease)    pregnancy related-zantac  . HTN (hypertension)   . Hyperlipidemia   . Infertility, female   . Joint pain   . Lactose intolerance   . Missed abortion    6weeks 3days  . Muscle pain   . Obesity   . Palpitations   . PCOS (polycystic ovarian syndrome)   . Seasonal  allergies   . Skin rash   . Stress   . Vitamin D deficiency     PAST SURGICAL HISTORY: Past Surgical History:  Procedure Laterality Date  . BREAST BIOPSY    . CESAREAN SECTION    . DILATION AND EVACUATION  05/09/2012   Procedure: DILATATION AND EVACUATION;  Surgeon: Marvene Staff, MD;  Location: [facility];  Service: Gynecology;  Laterality: N/A;  . EYE SURGERY    . LASIK    . [place]    . WISDOM TOOTH EXTRACTION  1994    SOCIAL HISTORY: Social History   Tobacco Use  . Smoking status: Never Smoker  . Smokeless tobacco: Never Used  Substance Use Topics  . Alcohol use: No    Alcohol/week: 0.0 standard drinks  . Drug use: No    FAMILY HISTORY: Family History  Problem Relation Age of Onset  . Hyperlipidemia Mother   . Hyperlipidemia Father   . Hypertension Father   . Anxiety disorder Father   .  Hyperlipidemia Maternal Grandmother   . Hypertension Maternal Grandmother   . Heart disease Maternal Grandmother   . Heart disease Maternal Grandfather   . Diabetes Maternal Grandfather   . Stroke Paternal Grandmother   . Mental illness Paternal Grandmother   . Heart disease Paternal Grandmother     ROS: Review of Systems  Constitutional: Negative for weight loss.  Cardiovascular: Negative for chest pain and claudication.  Musculoskeletal: Negative for myalgias.  Endo/Heme/Allergies:       Negative hypoglycemia    PHYSICAL EXAM: Pt in no acute distress  RECENT LABS AND TESTS: BMET    Component Value Date/Time   NA 139 11/27/2017 1203   K 4.3 11/27/2017 1203   CL 99 11/27/2017 1203   CO2 24 11/27/2017 1203   GLUCOSE 84 11/27/2017 1203   GLUCOSE 91 04/06/2015 1715   BUN 11 11/27/2017 1203   CREATININE 0.89 11/27/2017 1203   CALCIUM 9.7 11/27/2017 1203   GFRNONAA 79 11/27/2017 1203   GFRAA 91 11/27/2017 1203   Lab Results  Component Value Date   HGBA1C 5.3 07/16/2018   HGBA1C 5.4 03/12/2018   HGBA1C 5.7 (H) 11/27/2017   Lab Results  Component Value Date    INSULIN 8.2 07/16/2018   INSULIN 6.7 03/12/2018   INSULIN 12.2 11/27/2017   CBC    Component Value Date/Time   WBC 8.0 11/27/2017 1203   WBC 10.4 04/06/2015 1715   RBC 4.54 11/27/2017 1203   RBC 4.58 04/06/2015 1715   HGB 13.6 11/27/2017 1203   HCT 38.8 11/27/2017 1203   PLT 343 04/06/2015 1715   MCV 86 11/27/2017 1203   MCH 30.0 11/27/2017 1203   MCH 30.3 04/06/2015 1715   MCHC 35.1 11/27/2017 1203   MCHC 33.7 04/06/2015 1715   RDW 14.5 11/27/2017 1203   LYMPHSABS 2.2 11/27/2017 1203   MONOABS 0.6 04/06/2015 1715   EOSABS 0.2 11/27/2017 1203   BASOSABS 0.0 11/27/2017 1203   Iron/TIBC/Ferritin/ %Sat No results found for: IRON, TIBC, FERRITIN, IRONPCTSAT Lipid Panel     Component Value Date/Time   CHOL 208 (H) 07/16/2018 0902   TRIG 104 07/16/2018 0902   HDL 47 07/16/2018 0902   LDLCALC 140 (H) 07/16/2018 0902   Hepatic Function Panel     Component Value Date/Time   PROT 7.8 11/27/2017 1203   ALBUMIN 4.8 11/27/2017 1203   AST 20 11/27/2017 1203   ALT 20 11/27/2017 1203   ALKPHOS 42 11/27/2017 1203   BILITOT 0.4 11/27/2017 1203      Component Value Date/Time   TSH 0.667 11/27/2017 1203   TSH 0.406  05/26/2009 1040      I, Trixie Dredge, am acting as Location manager for Ilene Qua, MD   I have reviewed the above documentation for accuracy and completeness, and I agree with the above. - Ilene Qua, MD

## 2018-12-31 ENCOUNTER — Encounter (INDEPENDENT_AMBULATORY_CARE_PROVIDER_SITE_OTHER): Payer: Self-pay | Admitting: Family Medicine

## 2019-01-05 NOTE — Telephone Encounter (Signed)
FYI

## 2019-01-06 ENCOUNTER — Encounter (INDEPENDENT_AMBULATORY_CARE_PROVIDER_SITE_OTHER): Payer: Self-pay | Admitting: Family Medicine

## 2019-01-06 ENCOUNTER — Ambulatory Visit (INDEPENDENT_AMBULATORY_CARE_PROVIDER_SITE_OTHER): Payer: No Typology Code available for payment source | Admitting: Family Medicine

## 2019-01-06 ENCOUNTER — Other Ambulatory Visit: Payer: Self-pay

## 2019-01-06 DIAGNOSIS — R7303 Prediabetes: Secondary | ICD-10-CM

## 2019-01-06 DIAGNOSIS — Z6835 Body mass index (BMI) 35.0-35.9, adult: Secondary | ICD-10-CM

## 2019-01-06 DIAGNOSIS — E7849 Other hyperlipidemia: Secondary | ICD-10-CM

## 2019-01-06 DIAGNOSIS — E559 Vitamin D deficiency, unspecified: Secondary | ICD-10-CM

## 2019-01-06 NOTE — Telephone Encounter (Signed)
[email or web address]

## 2019-01-11 NOTE — Progress Notes (Signed)
Office: [phone]  /  Fax: [phone] TeleHealth Visit:  Robin Knapp has verbally consented to this TeleHealth visit today. The patient is located at home, the provider is located at the [facility] office. The participants in this visit include the listed provider and patient. The visit was conducted today via face time.  HPI:   Chief Complaint: OBESITY Robin Knapp is here to discuss her progress with her obesity treatment plan. She is on the Category 2 plan and is following her eating plan approximately 50 % of the time. She states she is exercising 0 minutes 0 times per week. Robin Knapp is experiencing fairly significant right knee pain, secondary to twisting it playing kickball. She has iced it and rested it. She is having fairly significant work stress. The first week was more motivating, and the second week she struggled to stay on the plan.  We were unable to weigh the patient today for this TeleHealth visit. She feels as if she has maintained her weight since her last visit. She has lost 16 lbs since starting treatment with [name].  Pre-Diabetes Robin Knapp has a diagnosis of pre-diabetes based on her elevated Hgb A1c and was informed this puts her at greater risk of developing diabetes. Last labs were at the end of October 2019. She is taking metformin currently and notes carbohydrate cravings. She continues to work on diet and exercise to decrease risk of diabetes. She denies nausea or hypoglycemia.  Vitamin D Deficiency Robin Knapp has a diagnosis of vitamin D deficiency. She is currently taking OTC Vit D. She notes fatigue and denies nausea, vomiting or muscle weakness.  Hyperlipidemia Robin Knapp has hyperlipidemia and has been trying to improve her cholesterol levels with intensive lifestyle modification including a low saturated fat diet, exercise and weight loss. Last LDL was of 140, HDL of 47, and triglycerides of 104. She is not on statin and denies any chest pain,  claudication or myalgias.  ASSESSMENT AND PLAN:  Prediabetes - Plan: Hemoglobin A1c, Insulin, random  Vitamin D deficiency - Plan: VITAMIN D 25 Hydroxy (Vit-D Deficiency, Fractures)  Other hyperlipidemia - Plan: Lipid Panel With LDL/HDL Ratio  Class 2 severe obesity with serious comorbidity and body mass index (BMI) of 35.0 to 35.9 in adult, unspecified obesity type ([name])  PLAN:  Pre-Diabetes Robin Knapp will continue to work on weight loss, exercise, and decreasing simple carbohydrates in her diet to help decrease the risk of diabetes. We dicussed metformin including benefits and risks. She was informed that eating too many simple carbohydrates or too many calories at one sitting increases the likelihood of GI side effects. Robin Knapp agrees to continue taking metformin, and we will repeat Hgb A1c and insulin today. Robin Knapp agrees to follow up with our clinic in 2 weeks as directed to monitor her progress.  Vitamin D Deficiency Robin Knapp was informed that low vitamin D levels contributes to fatigue and are associated with obesity, breast, and colon cancer. Robin Knapp agrees to continue taking OTC Vit D and will follow up for routine testing of vitamin D, at least 2-3 times per year. She was informed of the risk of over-replacement of vitamin D and agrees to not increase her dose unless she discusses this with [name] first. We will repeat Vit D level today. Robin Knapp agrees to follow up with our clinic in 2 weeks.  Hyperlipidemia Robin Knapp was informed of the American Heart Association Guidelines emphasizing intensive lifestyle modifications as the first line treatment for hyperlipidemia. We discussed many lifestyle modifications today in  depth, and Robin Knapp will continue to work on decreasing saturated fats such as fatty red meat, butter and many fried foods. She will also increase vegetables and lean protein in her diet and continue to work on exercise and weight loss efforts. We will repeat FLP today. Robin Knapp  agrees to follow up with our clinic in 2 weeks.  Obesity Robin Knapp is currently in the action stage of change. As such, her goal is to continue with weight loss efforts She has agreed to follow the Category 2 plan Robin Knapp has been instructed to work up to a goal of 150 minutes of combined cardio and strengthening exercise per week for weight loss and overall health benefits. We discussed the following Behavioral Modification Strategies today: increasing lean protein intake, work on meal planning and easy cooking plans, emotional eating strategies and ways to avoid boredom eating, keeping healthy foods in the home, better snacking choices, and planning for success   Robin Knapp has agreed to follow up with our clinic in 2 weeks. She was informed of the importance of frequent follow up visits to maximize her success with intensive lifestyle modifications for her multiple health conditions.  ALLERGIES: Allergies  Allergen Reactions  . Sulfa Antibiotics Hives    itching  . Sunscreens     MEDICATIONS: Current Outpatient Medications on File Prior to Visit  Medication Sig Dispense Refill  . acetaminophen (TYLENOL 8 HOUR ARTHRITIS PAIN) 650 MG CR tablet Take 650 mg by mouth every 8 (eight) hours as needed for pain.    [name] albuterol (PROVENTIL HFA;VENTOLIN HFA) 108 (90 Base) MCG/ACT inhaler Inhale 2 puffs into the lungs every 6 (six) hours as needed for wheezing or shortness of breath.    . cetirizine (ZYRTEC) 10 MG tablet Take 10 mg by mouth daily.    . Cholecalciferol ([employer] VITAMIN D3) 10000 units CAPS Take by mouth.    . gabapentin (NEURONTIN) 100 MG capsule Take 3 capsules (300 mg total) by mouth at bedtime. 90 capsule 11  . ibuprofen (ADVIL,MOTRIN) 600 MG tablet Take 600 mg by mouth every 6 (six) hours as needed.    [name] levonorgestrel (MIRENA) 20 MCG/24HR IUD 1 each by Intrauterine route once.    . Magnesium 250 MG TABS Take 1 tablet by mouth daily.    . metFORMIN (GLUCOPHAGE) 500 MG tablet Take 1  tablet (500 mg total) by mouth daily with breakfast. 90 tablet 0  . montelukast (SINGULAIR) 10 MG tablet Take 10 mg by mouth at bedtime.    . Multiple Vitamins-Minerals (HAIR SKIN & NAILS ADVANCED PO) Take 1 tablet by mouth daily.    . Omega-3 Fatty Acids (OMEGA-3 FISH OIL) 1200 MG CAPS Take 2 capsules by mouth daily.    . Oral Electrolytes (REVITAL JELL CUPS PO) Take 1 capsule by mouth daily.     No current facility-administered medications on file prior to visit.     PAST MEDICAL HISTORY: Past Medical History:  Diagnosis Date  . Allergy   . Asthma   . Breast lump   . Constipation   . Dry skin   . Fatigue   . GERD (gastroesophageal reflux disease)    pregnancy related-zantac  . HTN (hypertension)   . Hyperlipidemia   . Infertility, female   . Joint pain   . Lactose intolerance   . Missed abortion    6weeks 3days  . Muscle pain   . Obesity   . Palpitations   . PCOS (polycystic ovarian syndrome)   . Seasonal allergies   .  Skin rash   . Stress   . Vitamin D deficiency     PAST SURGICAL HISTORY: Past Surgical History:  Procedure Laterality Date  . BREAST BIOPSY    . CESAREAN SECTION    . DILATION AND EVACUATION  05/09/2012   Procedure: DILATATION AND EVACUATION;  Surgeon: Marvene Staff, MD;  Location: [facility];  Service: Gynecology;  Laterality: N/A;  . EYE SURGERY    . LASIK    . [place]    . WISDOM TOOTH EXTRACTION  1994    SOCIAL HISTORY: Social History   Tobacco Use  . Smoking status: Never Smoker  . Smokeless tobacco: Never Used  Substance Use Topics  . Alcohol use: No    Alcohol/week: 0.0 standard drinks  . Drug use: No    FAMILY HISTORY: Family History  Problem Relation Age of Onset  . Hyperlipidemia Mother   . Hyperlipidemia Father   . Hypertension Father   . Anxiety disorder Father   . Hyperlipidemia Maternal Grandmother   . Hypertension Maternal Grandmother   . Heart disease Maternal Grandmother   . Heart disease Maternal Grandfather   .  Diabetes Maternal Grandfather   . Stroke Paternal Grandmother   . Mental illness Paternal Grandmother   . Heart disease Paternal Grandmother     ROS: Review of Systems  Constitutional: Positive for malaise/fatigue. Negative for weight loss.  Cardiovascular: Negative for chest pain and claudication.  Gastrointestinal: Negative for nausea and vomiting.  Musculoskeletal: Negative for myalgias.       Negative muscle weakness + Right knee pain  Endo/Heme/Allergies:       Negative hypoglycemia    PHYSICAL EXAM: Pt in no acute distress  RECENT LABS AND TESTS: BMET    Component Value Date/Time   NA 139 11/27/2017 1203   K 4.3 11/27/2017 1203   CL 99 11/27/2017 1203   CO2 24 11/27/2017 1203   GLUCOSE 84 11/27/2017 1203   GLUCOSE 91 04/06/2015 1715   BUN 11 11/27/2017 1203   CREATININE 0.89 11/27/2017 1203   CALCIUM 9.7 11/27/2017 1203   GFRNONAA 79 11/27/2017 1203   GFRAA 91 11/27/2017 1203   Lab Results  Component Value Date   HGBA1C 5.3 07/16/2018   HGBA1C 5.4 03/12/2018   HGBA1C 5.7 (H) 11/27/2017   Lab Results  Component Value Date   INSULIN 8.2 07/16/2018   INSULIN 6.7 03/12/2018   INSULIN 12.2 11/27/2017   CBC    Component Value Date/Time   WBC 8.0 11/27/2017 1203   WBC 10.4 04/06/2015 1715   RBC 4.54 11/27/2017 1203   RBC 4.58 04/06/2015 1715   HGB 13.6 11/27/2017 1203   HCT 38.8 11/27/2017 1203   PLT 343 04/06/2015 1715   MCV 86 11/27/2017 1203   MCH 30.0 11/27/2017 1203   MCH 30.3 04/06/2015 1715   MCHC 35.1 11/27/2017 1203   MCHC 33.7 04/06/2015 1715   RDW 14.5 11/27/2017 1203   LYMPHSABS 2.2 11/27/2017 1203   MONOABS 0.6 04/06/2015 1715   EOSABS 0.2 11/27/2017 1203   BASOSABS 0.0 11/27/2017 1203   Iron/TIBC/Ferritin/ %Sat No results found for: IRON, TIBC, FERRITIN, IRONPCTSAT Lipid Panel     Component Value Date/Time   CHOL 208 (H) 07/16/2018 0902   TRIG 104 07/16/2018 0902   HDL 47 07/16/2018 0902   LDLCALC 140 (H) 07/16/2018 0902    Hepatic Function Panel     Component Value Date/Time   PROT 7.8 11/27/2017 1203   ALBUMIN 4.8 11/27/2017 1203  AST 20 11/27/2017 1203   ALT 20 11/27/2017 1203   ALKPHOS 42 11/27/2017 1203   BILITOT 0.4 11/27/2017 1203      Component Value Date/Time   TSH 0.667 11/27/2017 1203   TSH 0.406  05/26/2009 1040      I, Trixie Dredge, am acting as transcriptionist for Robin Qua, MD  I have reviewed the above documentation for accuracy and completeness, and I agree with the above. - Robin Qua, MD

## 2019-01-13 NOTE — Telephone Encounter (Signed)
Done

## 2019-01-13 NOTE — Telephone Encounter (Signed)
Can we cancel the HgA1c I ordered on her most recent appt? She just had it done :)

## 2019-01-14 LAB — LIPID PANEL WITH LDL/HDL RATIO
Cholesterol, Total: 196 mg/dL (ref 100–199)
HDL: 43 mg/dL (ref >39)
LDL Calculated: 139 mg/dL — ABNORMAL HIGH (ref 0–99)
LDl/HDL Ratio: 3.2 ratio (ref 0.0–3.2)
Triglycerides: 71 mg/dL (ref 0–149)
VLDL Cholesterol Cal: 14 mg/dL (ref 5–40)

## 2019-01-14 LAB — VITAMIN D 25 HYDROXY (VIT D DEFICIENCY, FRACTURES): Vit D, 25-Hydroxy: 60.5 ng/mL (ref 30.0–100.0)

## 2019-01-14 LAB — INSULIN, RANDOM: INSULIN: 8.2 u[IU]/mL (ref 2.6–24.9)

## 2019-01-14 LAB — HEMOGLOBIN A1C
Est. average glucose Bld gHb Est-mCnc: 108 mg/dL
Hgb A1c MFr Bld: 5.4 % (ref 4.8–5.6)

## 2019-01-20 ENCOUNTER — Ambulatory Visit (INDEPENDENT_AMBULATORY_CARE_PROVIDER_SITE_OTHER): Payer: No Typology Code available for payment source | Admitting: Family Medicine

## 2019-01-20 ENCOUNTER — Other Ambulatory Visit: Payer: Self-pay

## 2019-01-20 ENCOUNTER — Encounter (INDEPENDENT_AMBULATORY_CARE_PROVIDER_SITE_OTHER): Payer: Self-pay | Admitting: Family Medicine

## 2019-01-20 DIAGNOSIS — Z6835 Body mass index (BMI) 35.0-35.9, adult: Secondary | ICD-10-CM | POA: Diagnosis not present

## 2019-01-20 DIAGNOSIS — E7849 Other hyperlipidemia: Secondary | ICD-10-CM | POA: Diagnosis not present

## 2019-01-20 DIAGNOSIS — R7303 Prediabetes: Secondary | ICD-10-CM

## 2019-01-20 NOTE — Progress Notes (Signed)
Office: [phone]  /  Fax: [phone] TeleHealth Visit:  Robin Knapp has verbally consented to this TeleHealth visit today. The patient is located at home, the provider is located at the [employer] [facility] office. The participants in this visit include the listed provider and patient. The visit was conducted today via face time.  HPI:   Chief Complaint: OBESITY Robin Knapp is here to discuss her progress with her obesity treatment plan. She is on the Category 2 plan and is following her eating plan approximately 50 % of the time. She states she is walking for 45 minutes 2 times per week. Robin Knapp has noticed that she has been able to walk more with her knee brace, and eccentric exercises. She is still struggling to follow the meal plan. She has realized that she is heavily influenced by her husband and daughter. Her last weight was 186 lbs.  We were unable to weigh the patient today for this TeleHealth visit. She feels as if she has maintained her weight since her last visit. She has lost 16 lbs since starting treatment with [name].  Hyperlipidemia Robin Knapp has hyperlipidemia and has been trying to improve her cholesterol levels with intensive lifestyle modification including a low saturated fat diet, exercise and weight loss. Last LDL was of 139 (previously 140), and HDL of 43. She denies any chest pain, claudication or myalgias.  Pre-Diabetes Robin Knapp has a diagnosis of pre-diabetes based on her elevated Hgb A1c of 5.4 and insulin of 8.2. She was informed this puts her at greater risk of developing diabetes. She is taking metformin currently and notes better control of carbohydrate cravings. She continues to work on diet and exercise to decrease risk of diabetes. She denies nausea or hypoglycemia.  ASSESSMENT AND PLAN:  Other hyperlipidemia  Prediabetes  Class 2 severe obesity with serious comorbidity and body mass index (BMI) of 35.0 to 35.9 in adult, unspecified obesity type  ([place])  PLAN:  Hyperlipidemia Robin Knapp was informed of the American Heart Association Guidelines emphasizing intensive lifestyle modifications as the first line treatment for hyperlipidemia. We discussed many lifestyle modifications today in depth, and Robin Knapp will continue to work on decreasing saturated fats such as fatty red meat, butter and many fried foods. She will also increase vegetables and lean protein in her diet and continue to work on exercise and weight loss efforts. We will repeat labs in early August. Robin Knapp agrees to follow up with our clinic in 2 weeks.  Pre-Diabetes Robin Knapp will continue to work on weight loss, exercise, and decreasing simple carbohydrates in her diet to help decrease the risk of diabetes. We dicussed metformin including benefits and risks. She was informed that eating too many simple carbohydrates or too many calories at one sitting increases the likelihood of GI side effects. Robin Knapp agrees to continue taking metformin and we will repeat labs in early August. Robin Knapp agrees to follow up with our clinic in 2 weeks as directed to monitor her progress.  Obesity Robin Knapp is currently in the action stage of change. As such, her goal is to continue with weight loss efforts She has agreed to follow the Category 2 plan Robin Knapp has been instructed to work up to a goal of 150 minutes of combined cardio and strengthening exercise per week for weight loss and overall health benefits. We discussed the following Behavioral Modification Strategies today: increasing lean protein intake, increasing vegetables and work on meal planning and easy cooking plans, keeping healthy foods in the home, and planning for  success   Robin Knapp has agreed to follow up with our clinic in 2 weeks. She was informed of the importance of frequent follow up visits to maximize her success with intensive lifestyle modifications for her multiple health conditions.  ALLERGIES: Allergies  Allergen  Reactions  . Sulfa Antibiotics Hives    itching  . Sunscreens     MEDICATIONS: Current Outpatient Medications on File Prior to Visit  Medication Sig Dispense Refill  . acetaminophen (TYLENOL 8 HOUR ARTHRITIS PAIN) 650 MG CR tablet Take 650 mg by mouth every 8 (eight) hours as needed for pain.    [name] albuterol (PROVENTIL HFA;VENTOLIN HFA) 108 (90 Base) MCG/ACT inhaler Inhale 2 puffs into the lungs every 6 (six) hours as needed for wheezing or shortness of breath.    . cetirizine (ZYRTEC) 10 MG tablet Take 10 mg by mouth daily.    . Cholecalciferol ([employer]S VITAMIN D3) 10000 units CAPS Take by mouth.    . gabapentin (NEURONTIN) 100 MG capsule Take 3 capsules (300 mg total) by mouth at bedtime. 90 capsule 11  . ibuprofen (ADVIL,MOTRIN) 600 MG tablet Take 600 mg by mouth every 6 (six) hours as needed.    [name] levonorgestrel (MIRENA) 20 MCG/24HR IUD 1 each by Intrauterine route once.    . Magnesium 250 MG TABS Take 1 tablet by mouth daily.    . metFORMIN (GLUCOPHAGE) 500 MG tablet Take 1 tablet (500 mg total) by mouth daily with breakfast. 90 tablet 0  . montelukast (SINGULAIR) 10 MG tablet Take 10 mg by mouth at bedtime.    . Multiple Vitamins-Minerals (HAIR SKIN & NAILS ADVANCED PO) Take 1 tablet by mouth daily.    . Omega-3 Fatty Acids (OMEGA-3 FISH OIL) 1200 MG CAPS Take 2 capsules by mouth daily.    . Oral Electrolytes (REVITAL JELL CUPS PO) Take 1 capsule by mouth daily.     No current facility-administered medications on file prior to visit.     PAST MEDICAL HISTORY: Past Medical History:  Diagnosis Date  . Allergy   . Asthma   . Breast lump   . Constipation   . Dry skin   . Fatigue   . GERD (gastroesophageal reflux disease)    pregnancy related-zantac  . HTN (hypertension)   . Hyperlipidemia   . Infertility, female   . Joint pain   . Lactose intolerance   . Missed abortion    6weeks 3days  . Muscle pain   . Obesity   . Palpitations   . PCOS (polycystic ovarian syndrome)   .  Seasonal allergies   . Skin rash   . Stress   . Vitamin D deficiency     PAST SURGICAL HISTORY: Past Surgical History:  Procedure Laterality Date  . BREAST BIOPSY    . CESAREAN SECTION    . DILATION AND EVACUATION  05/09/2012   Procedure: DILATATION AND EVACUATION;  Surgeon: Marvene Staff, MD;  Location: [facility];  Service: Gynecology;  Laterality: N/A;  . EYE SURGERY    . LASIK    . [place]    . WISDOM TOOTH EXTRACTION  1994    SOCIAL HISTORY: Social History   Tobacco Use  . Smoking status: Never Smoker  . Smokeless tobacco: Never Used  Substance Use Topics  . Alcohol use: No    Alcohol/week: 0.0 standard drinks  . Drug use: No    FAMILY HISTORY: Family History  Problem Relation Age of Onset  . Hyperlipidemia Mother   . Hyperlipidemia Father   .  Hypertension Father   . Anxiety disorder Father   . Hyperlipidemia Maternal Grandmother   . Hypertension Maternal Grandmother   . Heart disease Maternal Grandmother   . Heart disease Maternal Grandfather   . Diabetes Maternal Grandfather   . Stroke Paternal Grandmother   . Mental illness Paternal Grandmother   . Heart disease Paternal Grandmother     ROS: Review of Systems  Constitutional: Negative for weight loss.  Cardiovascular: Negative for chest pain and claudication.  Gastrointestinal: Negative for nausea.  Musculoskeletal: Negative for myalgias.  Endo/Heme/Allergies:       Negative hypoglycemia    PHYSICAL EXAM: Pt in no acute distress  RECENT LABS AND TESTS: BMET    Component Value Date/Time   NA 139 11/27/2017 1203   K 4.3 11/27/2017 1203   CL 99 11/27/2017 1203   CO2 24 11/27/2017 1203   GLUCOSE 84 11/27/2017 1203   GLUCOSE 91 04/06/2015 1715   BUN 11 11/27/2017 1203   CREATININE 0.89 11/27/2017 1203   CALCIUM 9.7 11/27/2017 1203   GFRNONAA 79 11/27/2017 1203   GFRAA 91 11/27/2017 1203   Lab Results  Component Value Date   HGBA1C 5.4 01/13/2019   HGBA1C 5.3 07/16/2018   HGBA1C 5.4  03/12/2018   HGBA1C 5.7 (H) 11/27/2017   Lab Results  Component Value Date   INSULIN 8.2 01/13/2019   INSULIN 8.2 07/16/2018   INSULIN 6.7 03/12/2018   INSULIN 12.2 11/27/2017   CBC    Component Value Date/Time   WBC 8.0 11/27/2017 1203   WBC 10.4 04/06/2015 1715   RBC 4.54 11/27/2017 1203   RBC 4.58 04/06/2015 1715   HGB 13.6 11/27/2017 1203   HCT 38.8 11/27/2017 1203   PLT 343 04/06/2015 1715   MCV 86 11/27/2017 1203   MCH 30.0 11/27/2017 1203   MCH 30.3 04/06/2015 1715   MCHC 35.1 11/27/2017 1203   MCHC 33.7 04/06/2015 1715   RDW 14.5 11/27/2017 1203   LYMPHSABS 2.2 11/27/2017 1203   MONOABS 0.6 04/06/2015 1715   EOSABS 0.2 11/27/2017 1203   BASOSABS 0.0 11/27/2017 1203   Iron/TIBC/Ferritin/ %Sat No results found for: IRON, TIBC, FERRITIN, IRONPCTSAT Lipid Panel     Component Value Date/Time   CHOL 196 01/13/2019 1028   TRIG 71 01/13/2019 1028   HDL 43 01/13/2019 1028   LDLCALC 139 (H) 01/13/2019 1028   Hepatic Function Panel     Component Value Date/Time   PROT 7.8 11/27/2017 1203   ALBUMIN 4.8 11/27/2017 1203   AST 20 11/27/2017 1203   ALT 20 11/27/2017 1203   ALKPHOS 42 11/27/2017 1203   BILITOT 0.4 11/27/2017 1203      Component Value Date/Time   TSH 0.667 11/27/2017 1203   TSH 0.406  05/26/2009 1040      I, Trixie Dredge, am acting as Location manager for Ilene Qua, MD   I have reviewed the above documentation for accuracy and completeness, and I agree with the above. - Ilene Qua, MD

## 2019-02-03 ENCOUNTER — Other Ambulatory Visit: Payer: Self-pay

## 2019-02-03 ENCOUNTER — Ambulatory Visit (INDEPENDENT_AMBULATORY_CARE_PROVIDER_SITE_OTHER): Payer: No Typology Code available for payment source | Admitting: Family Medicine

## 2019-02-03 DIAGNOSIS — E7849 Other hyperlipidemia: Secondary | ICD-10-CM | POA: Diagnosis not present

## 2019-02-03 DIAGNOSIS — Z6830 Body mass index (BMI) 30.0-30.9, adult: Secondary | ICD-10-CM | POA: Diagnosis not present

## 2019-02-03 DIAGNOSIS — E669 Obesity, unspecified: Secondary | ICD-10-CM | POA: Diagnosis not present

## 2019-02-03 DIAGNOSIS — R7303 Prediabetes: Secondary | ICD-10-CM | POA: Diagnosis not present

## 2019-02-03 NOTE — Progress Notes (Signed)
Office: [phone]  /  Fax: [phone] TeleHealth Visit:  Robin Knapp has verbally consented to this TeleHealth visit today. The patient is located at home, the provider is located at the [facility] office. The participants in this visit include the listed provider and patient. The visit was conducted today via face time.  HPI:   Chief Complaint: OBESITY Robin Knapp is here to discuss her progress with her obesity treatment plan. She is on the Category 2 plan and is following her eating plan approximately 50 % of the time. She states she is walking for 1.5 hours 3 times per week and doing cardio for 45 minutes 1 time per week. Robin Knapp voices the last few weeks she partnered with a friend and did some hiking trails, getting up to 7 miles on a hike. She feels uncomfortable so she feels she is gaining weight. She hasn't weighed herself so she is unsure if she has lost or gained weight. She is not always packing meals and that's why she struggles at work.  We were unable to weigh the patient today for this TeleHealth visit. She is unsure if she has lost or gained weight since her last visit. She has lost 16 lbs since starting treatment with [name].  Pre-Diabetes Robin Knapp has a diagnosis of pre-diabetes based on her elevated Hgb A1c and was informed this puts her at greater risk of developing diabetes. She denies GI side effects of metformin and notes carbohydrate cravings with carbohydrate snacks. She continues to work on diet and exercise to decrease risk of diabetes.   Hyperlipidemia Robin Knapp has hyperlipidemia and has been trying to improve her cholesterol levels with intensive lifestyle modification including a low saturated fat diet, exercise and weight loss. Last LDL was of 139 and she is not on statin. She reports increase in fried food recently. She denies any chest pain, claudication or myalgias.  ASSESSMENT AND PLAN:  Prediabetes  Other hyperlipidemia  Class 1 obesity  with serious comorbidity and body mass index (BMI) of 30.0 to 30.9 in adult, unspecified obesity type  PLAN:  Pre-Diabetes Robin Knapp will continue to work on weight loss, exercise, and decreasing simple carbohydrates in her diet to help decrease the risk of diabetes. We dicussed metformin including benefits and risks. She was informed that eating too many simple carbohydrates or too many calories at one sitting increases the likelihood of GI side effects. Robin Knapp agrees to continue taking metformin and we will repeat labs at the end of July. Robin Knapp agrees to follow up with our clinic in 2 weeks as directed to monitor her progress.  Hyperlipidemia Robin Knapp was informed of the American Heart Association Guidelines emphasizing intensive lifestyle modifications as the first line treatment for hyperlipidemia. We discussed many lifestyle modifications today in depth, and Robin Knapp will continue to work on decreasing saturated fats such as fatty red meat, butter and many fried foods. She will also increase vegetables and lean protein in her diet and continue to work on exercise and weight loss efforts. We will repeat labs at the end of July. Robin Knapp agrees to follow up with our clinic in 2 weeks.  Obesity Robin Knapp is currently in the action stage of change. As such, her goal is to continue with weight loss efforts She has agreed to follow the Category 2 plan  Robin Knapp's goal is to get up to 75 % on meal plan. Robin Knapp has been instructed to work up to a goal of 150 minutes of combined cardio and strengthening exercise  per week for weight loss and overall health benefits. We discussed the following Behavioral Modification Strategies today: increasing lean protein intake, increasing vegetables, work on meal planning and easy cooking plans, dealing with family or coworker sabotage, and keeping healthy foods in the home   Robin Knapp has agreed to follow up with our clinic in 2 weeks. She was informed of the importance  of frequent follow up visits to maximize her success with intensive lifestyle modifications for her multiple health conditions.  ALLERGIES: Allergies  Allergen Reactions  . Sulfa Antibiotics Hives    itching  . Sunscreens     MEDICATIONS: Current Outpatient Medications on File Prior to Visit  Medication Sig Dispense Refill  . acetaminophen (TYLENOL 8 HOUR ARTHRITIS PAIN) 650 MG CR tablet Take 650 mg by mouth every 8 (eight) hours as needed for pain.    [name] albuterol (PROVENTIL HFA;VENTOLIN HFA) 108 (90 Base) MCG/ACT inhaler Inhale 2 puffs into the lungs every 6 (six) hours as needed for wheezing or shortness of breath.    . cetirizine (ZYRTEC) 10 MG tablet Take 10 mg by mouth daily.    . Cholecalciferol ([employer] VITAMIN D3) 10000 units CAPS Take by mouth.    . gabapentin (NEURONTIN) 100 MG capsule Take 3 capsules (300 mg total) by mouth at bedtime. 90 capsule 11  . ibuprofen (ADVIL,MOTRIN) 600 MG tablet Take 600 mg by mouth every 6 (six) hours as needed.    [name] levonorgestrel (MIRENA) 20 MCG/24HR IUD 1 each by Intrauterine route once.    . Magnesium 250 MG TABS Take 1 tablet by mouth daily.    . metFORMIN (GLUCOPHAGE) 500 MG tablet Take 1 tablet (500 mg total) by mouth daily with breakfast. 90 tablet 0  . montelukast (SINGULAIR) 10 MG tablet Take 10 mg by mouth at bedtime.    . Multiple Vitamins-Minerals (HAIR SKIN & NAILS ADVANCED PO) Take 1 tablet by mouth daily.    . Omega-3 Fatty Acids (OMEGA-3 FISH OIL) 1200 MG CAPS Take 2 capsules by mouth daily.    . Oral Electrolytes (REVITAL JELL CUPS PO) Take 1 capsule by mouth daily.     No current facility-administered medications on file prior to visit.     PAST MEDICAL HISTORY: Past Medical History:  Diagnosis Date  . Allergy   . Asthma   . Breast lump   . Constipation   . Dry skin   . Fatigue   . GERD (gastroesophageal reflux disease)    pregnancy related-zantac  . HTN (hypertension)   . Hyperlipidemia   . Infertility, female   .  Joint pain   . Lactose intolerance   . Missed abortion    6weeks 3days  . Muscle pain   . Obesity   . Palpitations   . PCOS (polycystic ovarian syndrome)   . Seasonal allergies   . Skin rash   . Stress   . Vitamin D deficiency     PAST SURGICAL HISTORY: Past Surgical History:  Procedure Laterality Date  . BREAST BIOPSY    . CESAREAN SECTION    . DILATION AND EVACUATION  05/09/2012   Procedure: DILATATION AND EVACUATION;  Surgeon: Marvene Staff, MD;  Location: [facility];  Service: Gynecology;  Laterality: N/A;  . EYE SURGERY    . LASIK    . [place]    . WISDOM TOOTH EXTRACTION  1994    SOCIAL HISTORY: Social History   Tobacco Use  . Smoking status: Never Smoker  . Smokeless tobacco: Never Used  Substance Use Topics  . Alcohol use: No    Alcohol/week: 0.0 standard drinks  . Drug use: No    FAMILY HISTORY: Family History  Problem Relation Age of Onset  . Hyperlipidemia Mother   . Hyperlipidemia Father   . Hypertension Father   . Anxiety disorder Father   . Hyperlipidemia Maternal Grandmother   . Hypertension Maternal Grandmother   . Heart disease Maternal Grandmother   . Heart disease Maternal Grandfather   . Diabetes Maternal Grandfather   . Stroke Paternal Grandmother   . Mental illness Paternal Grandmother   . Heart disease Paternal Grandmother     ROS: Review of Systems  Constitutional: Negative for weight loss.  Cardiovascular: Negative for chest pain and claudication.  Musculoskeletal: Negative for myalgias.    PHYSICAL EXAM: Pt in no acute distress  RECENT LABS AND TESTS: BMET    Component Value Date/Time   NA 139 11/27/2017 1203   K 4.3 11/27/2017 1203   CL 99 11/27/2017 1203   CO2 24 11/27/2017 1203   GLUCOSE 84 11/27/2017 1203   GLUCOSE 91 04/06/2015 1715   BUN 11 11/27/2017 1203   CREATININE 0.89 11/27/2017 1203   CALCIUM 9.7 11/27/2017 1203   GFRNONAA 79 11/27/2017 1203   GFRAA 91 11/27/2017 1203   Lab Results  Component Value  Date   HGBA1C 5.4 01/13/2019   HGBA1C 5.3 07/16/2018   HGBA1C 5.4 03/12/2018   HGBA1C 5.7 (H) 11/27/2017   Lab Results  Component Value Date   INSULIN 8.2 01/13/2019   INSULIN 8.2 07/16/2018   INSULIN 6.7 03/12/2018   INSULIN 12.2 11/27/2017   CBC    Component Value Date/Time   WBC 8.0 11/27/2017 1203   WBC 10.4 04/06/2015 1715   RBC 4.54 11/27/2017 1203   RBC 4.58 04/06/2015 1715   HGB 13.6 11/27/2017 1203   HCT 38.8 11/27/2017 1203   PLT 343 04/06/2015 1715   MCV 86 11/27/2017 1203   MCH 30.0 11/27/2017 1203   MCH 30.3 04/06/2015 1715   MCHC 35.1 11/27/2017 1203   MCHC 33.7 04/06/2015 1715   RDW 14.5 11/27/2017 1203   LYMPHSABS 2.2 11/27/2017 1203   MONOABS 0.6 04/06/2015 1715   EOSABS 0.2 11/27/2017 1203   BASOSABS 0.0 11/27/2017 1203   Iron/TIBC/Ferritin/ %Sat No results found for: IRON, TIBC, FERRITIN, IRONPCTSAT Lipid Panel     Component Value Date/Time   CHOL 196 01/13/2019 1028   TRIG 71 01/13/2019 1028   HDL 43 01/13/2019 1028   LDLCALC 139 (H) 01/13/2019 1028   Hepatic Function Panel     Component Value Date/Time   PROT 7.8 11/27/2017 1203   ALBUMIN 4.8 11/27/2017 1203   AST 20 11/27/2017 1203   ALT 20 11/27/2017 1203   ALKPHOS 42 11/27/2017 1203   BILITOT 0.4 11/27/2017 1203      Component Value Date/Time   TSH 0.667 11/27/2017 1203   TSH 0.406  05/26/2009 1040      I, Trixie Dredge, am acting as Location manager for Ilene Qua, MD  I have reviewed the above documentation for accuracy and completeness, and I agree with the above. - Ilene Qua, MD

## 2019-02-04 ENCOUNTER — Encounter (INDEPENDENT_AMBULATORY_CARE_PROVIDER_SITE_OTHER): Payer: Self-pay | Admitting: Family Medicine

## 2019-02-19 ENCOUNTER — Encounter (INDEPENDENT_AMBULATORY_CARE_PROVIDER_SITE_OTHER): Payer: Self-pay | Admitting: Family Medicine

## 2019-02-19 ENCOUNTER — Other Ambulatory Visit: Payer: Self-pay

## 2019-02-19 ENCOUNTER — Ambulatory Visit (INDEPENDENT_AMBULATORY_CARE_PROVIDER_SITE_OTHER): Payer: No Typology Code available for payment source | Admitting: Family Medicine

## 2019-02-19 DIAGNOSIS — E559 Vitamin D deficiency, unspecified: Secondary | ICD-10-CM

## 2019-02-19 DIAGNOSIS — Z6830 Body mass index (BMI) 30.0-30.9, adult: Secondary | ICD-10-CM | POA: Diagnosis not present

## 2019-02-19 DIAGNOSIS — E669 Obesity, unspecified: Secondary | ICD-10-CM

## 2019-02-19 DIAGNOSIS — R7303 Prediabetes: Secondary | ICD-10-CM

## 2019-02-23 NOTE — Progress Notes (Signed)
Office: [phone]  /  Fax: [phone] TeleHealth Visit:  Robin Knapp has verbally consented to this TeleHealth visit today. The patient is located at home, the provider is located at the [facility] office. The participants in this visit include the listed provider and patient. The visit was conducted today via face time.  HPI:   Chief Complaint: OBESITY Robin Knapp is here to discuss her progress with her obesity treatment plan. She is on the Category 2 plan and is following her eating plan approximately 70 % of the time. She states she is walking for 50 minutes 2 times per week. Robin Knapp has had a relatively stressful 2 weeks at work. 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 She got tested 3 days ago for COVID-19. She is trying to recommit. Her weight was of 188 lbs yesterday. She states her blood pressure last week was 118/73. We were unable to weigh the patient today for this TeleHealth visit. She feels as if she has maintained her weight since her last visit. She has lost 16 lbs since starting treatment with [name].  Pre-Diabetes Robin Knapp has a diagnosis of pre-diabetes based on her elevated Hgb A1c, last labs were 01/13/2019 and Hgb A1c was of 5.4 and insulin of 8.2. She was informed this puts her at greater risk of developing diabetes. She is still on metformin currently and continues to work on diet and exercise to decrease risk of diabetes.   Vitamin D Deficiency Robin Knapp has a diagnosis of vitamin D deficiency. She is currently taking OTC Vit D. She notes fatigue and denies nausea, vomiting or muscle weakness.  ASSESSMENT AND PLAN:  Prediabetes  Vitamin D deficiency  Class 1 obesity with serious comorbidity and body mass index (BMI) of 30.0 to 30.9 in adult, unspecified obesity type  PLAN:  Pre-Diabetes Robin Knapp will continue to work on weight loss, exercise, and decreasing simple carbohydrates in her diet to help decrease the risk of  diabetes. We dicussed metformin including benefits and risks. She was informed that eating too many simple carbohydrates or too many calories at one sitting increases the likelihood of GI side effects. Robin Knapp agrees to continue taking metformin and we will repeat labs in early August. Robin Knapp agrees to follow up with our clinic in 2 weeks as directed to monitor her progress.  Vitamin D Deficiency Robin Knapp was informed that low vitamin D levels contributes to fatigue and are associated with obesity, breast, and colon cancer. Robin Knapp agrees to continue taking OTC Vit D and will follow up for routine testing of vitamin D, at least 2-3 times per year. She was informed of the risk of over-replacement of vitamin D and agrees to not increase her dose unless she discusses this with [name] first. Robin Knapp agrees to follow up with our clinic in 2 weeks.  Obesity Robin Knapp is currently in the action stage of change. As such, her goal is to continue with weight loss efforts She has agreed to follow the Category 2 plan Robin Knapp has been instructed to work up to a goal of 150 minutes of combined cardio and strengthening exercise per week for weight loss and overall health benefits. We discussed the following Behavioral Modification Strategies today: increasing lean protein intake, increasing vegetables and work on meal planning and easy cooking plans, keeping healthy foods in the home, and planning for success   Robin Knapp has agreed to follow up with our clinic in 2 weeks. She was informed of the importance of frequent  follow up visits to maximize her success with intensive lifestyle modifications for her multiple health conditions.  ALLERGIES: Allergies  Allergen Reactions  . Sulfa Antibiotics Hives    itching  . Sunscreens     MEDICATIONS: Current Outpatient Medications on File Prior to Visit  Medication Sig Dispense Refill  . acetaminophen (TYLENOL 8 HOUR ARTHRITIS PAIN) 650 MG CR tablet Take 650 mg by mouth  every 8 (eight) hours as needed for pain.    Robin Knapp albuterol (PROVENTIL HFA;VENTOLIN HFA) 108 (90 Base) MCG/ACT inhaler Inhale 2 puffs into the lungs every 6 (six) hours as needed for wheezing or shortness of breath.    . cetirizine (ZYRTEC) 10 MG tablet Take 10 mg by mouth daily.    . Cholecalciferol (CVS VITAMIN D3) 10000 units CAPS Take by mouth.    . gabapentin (NEURONTIN) 100 MG capsule Take 3 capsules (300 mg total) by mouth at bedtime. 90 capsule 11  . ibuprofen (ADVIL,MOTRIN) 600 MG tablet Take 600 mg by mouth every 6 (six) hours as needed.    Robin Knapp levonorgestrel (MIRENA) 20 MCG/24HR IUD 1 each by Intrauterine route once.    . Magnesium 250 MG TABS Take 1 tablet by mouth daily.    . metFORMIN (GLUCOPHAGE) 500 MG tablet Take 1 tablet (500 mg total) by mouth daily with breakfast. 90 tablet 0  . montelukast (SINGULAIR) 10 MG tablet Take 10 mg by mouth at bedtime.    . Multiple Vitamins-Minerals (HAIR SKIN & NAILS ADVANCED PO) Take 1 tablet by mouth daily.    . Omega-3 Fatty Acids (OMEGA-3 FISH OIL) 1200 MG CAPS Take 2 capsules by mouth daily.    . Oral Electrolytes (REVITAL JELL CUPS PO) Take 1 capsule by mouth daily.     No current facility-administered medications on file prior to visit.     PAST MEDICAL HISTORY: Past Medical History:  Diagnosis Date  . Allergy   . Asthma   . Breast lump   . Constipation   . Dry skin   . Fatigue   . GERD (gastroesophageal reflux disease)    pregnancy related-zantac  . HTN (hypertension)   . Hyperlipidemia   . Infertility, female   . Joint pain   . Lactose intolerance   . Missed abortion    6weeks 3days  . Muscle pain   . Obesity   . Palpitations   . PCOS (polycystic ovarian syndrome)   . Seasonal allergies   . Skin rash   . Stress   . Vitamin D deficiency     PAST SURGICAL HISTORY: Past Surgical History:  Procedure Laterality Date  . BREAST BIOPSY    . CESAREAN SECTION    . DILATION AND EVACUATION  05/09/2012   Procedure: DILATATION  AND EVACUATION;  Surgeon: Marvene Staff, MD;  Location: [facility];  Service: Gynecology;  Laterality: N/A;  . EYE SURGERY    . LASIK    . [place]    . WISDOM TOOTH EXTRACTION  1994    SOCIAL HISTORY: Social History   Tobacco Use  . Smoking status: Never Smoker  . Smokeless tobacco: Never Used  Substance Use Topics  . Alcohol use: No    Alcohol/week: 0.0 standard drinks  . Drug use: No    FAMILY HISTORY: Family History  Problem Relation Age of Onset  . Hyperlipidemia Mother   . Hyperlipidemia Father   . Hypertension Father   . Anxiety disorder Father   . Hyperlipidemia Maternal Grandmother   . Hypertension Maternal Grandmother   .  Heart disease Maternal Grandmother   . Heart disease Maternal Grandfather   . Diabetes Maternal Grandfather   . Stroke Paternal Grandmother   . Mental illness Paternal Grandmother   . Heart disease Paternal Grandmother     ROS: Review of Systems  Constitutional: Positive for malaise/fatigue. Negative for weight loss.  Gastrointestinal: Negative for nausea and vomiting.  Musculoskeletal:       Negative muscle weakness    PHYSICAL EXAM: Pt in no acute distress  RECENT LABS AND TESTS: BMET    Component Value Date/Time   NA 139 11/27/2017 1203   K 4.3 11/27/2017 1203   CL 99 11/27/2017 1203   CO2 24 11/27/2017 1203   GLUCOSE 84 11/27/2017 1203   GLUCOSE 91 04/06/2015 1715   BUN 11 11/27/2017 1203   CREATININE 0.89 11/27/2017 1203   CALCIUM 9.7 11/27/2017 1203   GFRNONAA 79 11/27/2017 1203   GFRAA 91 11/27/2017 1203   Lab Results  Component Value Date   HGBA1C 5.4 01/13/2019   HGBA1C 5.3 07/16/2018   HGBA1C 5.4 03/12/2018   HGBA1C 5.7 (H) 11/27/2017   Lab Results  Component Value Date   INSULIN 8.2 01/13/2019   INSULIN 8.2 07/16/2018   INSULIN 6.7 03/12/2018   INSULIN 12.2 11/27/2017   CBC    Component Value Date/Time   WBC 8.0 11/27/2017 1203   WBC 10.4 04/06/2015 1715   RBC 4.54 11/27/2017 1203   RBC 4.58  04/06/2015 1715   HGB 13.6 11/27/2017 1203   HCT 38.8 11/27/2017 1203   PLT 343 04/06/2015 1715   MCV 86 11/27/2017 1203   MCH 30.0 11/27/2017 1203   MCH 30.3 04/06/2015 1715   MCHC 35.1 11/27/2017 1203   MCHC 33.7 04/06/2015 1715   RDW 14.5 11/27/2017 1203   LYMPHSABS 2.2 11/27/2017 1203   MONOABS 0.6 04/06/2015 1715   EOSABS 0.2 11/27/2017 1203   BASOSABS 0.0 11/27/2017 1203   Iron/TIBC/Ferritin/ %Sat No results found for: IRON, TIBC, FERRITIN, IRONPCTSAT Lipid Panel     Component Value Date/Time   CHOL 196 01/13/2019 1028   TRIG 71 01/13/2019 1028   HDL 43 01/13/2019 1028   LDLCALC 139 (H) 01/13/2019 1028   Hepatic Function Panel     Component Value Date/Time   PROT 7.8 11/27/2017 1203   ALBUMIN 4.8 11/27/2017 1203   AST 20 11/27/2017 1203   ALT 20 11/27/2017 1203   ALKPHOS 42 11/27/2017 1203   BILITOT 0.4 11/27/2017 1203      Component Value Date/Time   TSH 0.667 11/27/2017 1203   TSH 0.406  05/26/2009 1040      I, Trixie Dredge, am acting as Location manager for Ilene Qua, MD  I have reviewed the above documentation for accuracy and completeness, and I agree with the above. - Ilene Qua, MD

## 2019-03-03 ENCOUNTER — Ambulatory Visit (INDEPENDENT_AMBULATORY_CARE_PROVIDER_SITE_OTHER): Payer: No Typology Code available for payment source | Admitting: Family Medicine

## 2019-03-04 ENCOUNTER — Encounter (INDEPENDENT_AMBULATORY_CARE_PROVIDER_SITE_OTHER): Payer: Self-pay | Admitting: Family Medicine

## 2019-03-04 NOTE — Telephone Encounter (Signed)
Please review

## 2019-03-11 ENCOUNTER — Ambulatory Visit (INDEPENDENT_AMBULATORY_CARE_PROVIDER_SITE_OTHER): Payer: No Typology Code available for payment source | Admitting: Family Medicine

## 2019-03-11 ENCOUNTER — Encounter (INDEPENDENT_AMBULATORY_CARE_PROVIDER_SITE_OTHER): Payer: Self-pay | Admitting: Family Medicine

## 2019-03-11 ENCOUNTER — Other Ambulatory Visit: Payer: Self-pay

## 2019-03-11 VITALS — BP 135/76 | HR 90 | Temp 98.1°F | Ht 65.0 in | Wt 189.0 lb

## 2019-03-11 DIAGNOSIS — Z6831 Body mass index (BMI) 31.0-31.9, adult: Secondary | ICD-10-CM

## 2019-03-11 DIAGNOSIS — E7849 Other hyperlipidemia: Secondary | ICD-10-CM

## 2019-03-11 DIAGNOSIS — E669 Obesity, unspecified: Secondary | ICD-10-CM | POA: Diagnosis not present

## 2019-03-11 DIAGNOSIS — R7303 Prediabetes: Secondary | ICD-10-CM | POA: Diagnosis not present

## 2019-03-15 NOTE — Progress Notes (Signed)
Office: [phone]  /  Fax: [phone]   HPI:   Chief Complaint: OBESITY Robin Knapp is here to discuss her progress with her obesity treatment plan. She is on the Category 2 plan and is following her eating plan approximately 50 % of the time. She states she is walking for 30-45 minutes 2 times per week. Robin Knapp repeats her IC today. She is thinking about flipping dinner and lunch. She denies hunger or cravings. She is sometimes finding that she struggles to eat all of the food on the plan.  Her weight is 189 lb (85.7 kg) today and has gained 6 lbs since her last visit. She has lost 10 lbs since starting treatment with [name].  Pre-Diabetes Robin Knapp has a diagnosis of pre-diabetes based on her elevated Hgb A1c and was informed this puts her at greater risk of developing diabetes. She is taking metformin currently and denies GI side effects. She notes minimal cravings and continues to work on diet and exercise to decrease risk of diabetes. She denies hypoglycemia.  Hyperlipidemia Robin Knapp has hyperlipidemia and has been trying to improve her cholesterol levels with intensive lifestyle modification including a low saturated fat diet, exercise and weight loss. Last LDL was of 139 and she is not on statin. She denies any chest pain, claudication or myalgias.  ASSESSMENT AND PLAN:  Prediabetes  Other hyperlipidemia  Class 1 obesity with serious comorbidity and body mass index (BMI) of 31.0 to 31.9 in adult, unspecified obesity type  PLAN:  Pre-Diabetes Robin Knapp will continue to work on weight loss, exercise, and decreasing simple carbohydrates in her diet to help decrease the risk of diabetes. We dicussed metformin including benefits and risks. She was informed that eating too many simple carbohydrates or too many calories at one sitting increases the likelihood of GI side effects. [name] agrees to continue taking metformin, and she agrees to follow up with our clinic in 2 weeks as directed to  monitor her progress.  Hyperlipidemia Robin Knapp was informed of the American Heart Association Guidelines emphasizing intensive lifestyle modifications as the first line treatment for hyperlipidemia. We discussed many lifestyle modifications today in depth, and Robin Knapp will continue to work on decreasing saturated fats such as fatty red meat, butter and many fried foods. She will also increase vegetables and lean protein in her diet and continue to work on exercise and weight loss efforts. We will repeat labs at the end of July or early August. Robin Knapp agrees to follow up with our clinic in 2 weeks.  Obesity Robin Knapp is currently in the action stage of change. As such, her goal is to continue with weight loss efforts She has agreed to follow the Category 3 plan Robin Knapp has been instructed to work up to a goal of 150 minutes of combined cardio and strengthening exercise per week for weight loss and overall health benefits. We discussed the following Behavioral Modification Strategies today: increasing lean protein intake, increasing vegetables and work on meal planning and easy cooking plans, better snacking choices, and planning for success   Robin Knapp has agreed to follow up with our clinic in 2 weeks. She was informed of the importance of frequent follow up visits to maximize her success with intensive lifestyle modifications for her multiple health conditions.  ALLERGIES: Allergies  Allergen Reactions  . Sulfa Antibiotics Hives    itching  . Sunscreens     MEDICATIONS: Current Outpatient Medications on File Prior to Visit  Medication Sig Dispense Refill  . acetaminophen (TYLENOL 8 HOUR ARTHRITIS  PAIN) 650 MG CR tablet Take 650 mg by mouth every 8 (eight) hours as needed for pain.    [name] albuterol (PROVENTIL HFA;VENTOLIN HFA) 108 (90 Base) MCG/ACT inhaler Inhale 2 puffs into the lungs every 6 (six) hours as needed for wheezing or shortness of breath.    . cetirizine (ZYRTEC) 10 MG tablet Take  10 mg by mouth daily.    . Cholecalciferol ([employer] VITAMIN D3) 10000 units CAPS Take by mouth.    . gabapentin (NEURONTIN) 100 MG capsule Take 3 capsules (300 mg total) by mouth at bedtime. 90 capsule 11  . ibuprofen (ADVIL,MOTRIN) 600 MG tablet Take 600 mg by mouth every 6 (six) hours as needed.    [name] levonorgestrel (MIRENA) 20 MCG/24HR IUD 1 each by Intrauterine route once.    . Magnesium 250 MG TABS Take 1 tablet by mouth daily.    . metFORMIN (GLUCOPHAGE) 500 MG tablet Take 1 tablet (500 mg total) by mouth daily with breakfast. 90 tablet 0  . montelukast (SINGULAIR) 10 MG tablet Take 10 mg by mouth at bedtime.    . Multiple Vitamins-Minerals (HAIR SKIN & NAILS ADVANCED PO) Take 1 tablet by mouth daily.    . Omega-3 Fatty Acids (OMEGA-3 FISH OIL) 1200 MG CAPS Take 2 capsules by mouth daily.    . Oral Electrolytes (REVITAL JELL CUPS PO) Take 1 capsule by mouth daily.     No current facility-administered medications on file prior to visit.     PAST MEDICAL HISTORY: Past Medical History:  Diagnosis Date  . Allergy   . Asthma   . Breast lump   . Constipation   . Dry skin   . Fatigue   . GERD (gastroesophageal reflux disease)    pregnancy related-zantac  . HTN (hypertension)   . Hyperlipidemia   . Infertility, female   . Joint pain   . Lactose intolerance   . Missed abortion    6weeks 3days  . Muscle pain   . Obesity   . Palpitations   . PCOS (polycystic ovarian syndrome)   . Seasonal allergies   . Skin rash   . Stress   . Vitamin D deficiency     PAST SURGICAL HISTORY: Past Surgical History:  Procedure Laterality Date  . BREAST BIOPSY    . CESAREAN SECTION    . DILATION AND EVACUATION  05/09/2012   Procedure: DILATATION AND EVACUATION;  Surgeon: Marvene Staff, MD;  Location: [facility];  Service: Gynecology;  Laterality: N/A;  . EYE SURGERY    . LASIK    . [place]    . WISDOM TOOTH EXTRACTION  1994    SOCIAL HISTORY: Social History   Tobacco Use  . Smoking status:  Never Smoker  . Smokeless tobacco: Never Used  Substance Use Topics  . Alcohol use: No    Alcohol/week: 0.0 standard drinks  . Drug use: No    FAMILY HISTORY: Family History  Problem Relation Age of Onset  . Hyperlipidemia Mother   . Hyperlipidemia Father   . Hypertension Father   . Anxiety disorder Father   . Hyperlipidemia Maternal Grandmother   . Hypertension Maternal Grandmother   . Heart disease Maternal Grandmother   . Heart disease Maternal Grandfather   . Diabetes Maternal Grandfather   . Stroke Paternal Grandmother   . Mental illness Paternal Grandmother   . Heart disease Paternal Grandmother     ROS: Review of Systems  Constitutional: Negative for weight loss.  Cardiovascular: Negative for chest  pain and claudication.  Musculoskeletal: Negative for myalgias.  Endo/Heme/Allergies:       Negative hypoglycemia    PHYSICAL EXAM: Blood pressure 135/76, pulse 90, temperature 98.1 F (36.7 C), height 5\' 5"  (1.651 m), weight 189 lb (85.7 kg), last menstrual period 02/17/2019, SpO2 98 %. Body mass index is 31.45 kg/m. Physical Exam Vitals signs reviewed.  Constitutional:      Appearance: Normal appearance. She is obese.  Cardiovascular:     Rate and Rhythm: Normal rate.     Pulses: Normal pulses.  Pulmonary:     Effort: Pulmonary effort is normal.     Breath sounds: Normal breath sounds.  Musculoskeletal: Normal range of motion.  Skin:    General: Skin is warm and dry.  Neurological:     Mental Status: She is alert and oriented to person, place, and time.  Psychiatric:        Mood and Affect: Mood normal.        Behavior: Behavior normal.     RECENT LABS AND TESTS: BMET    Component Value Date/Time   NA 139 11/27/2017 1203   K 4.3 11/27/2017 1203   CL 99 11/27/2017 1203   CO2 24 11/27/2017 1203   GLUCOSE 84 11/27/2017 1203   GLUCOSE 91 04/06/2015 1715   BUN 11 11/27/2017 1203   CREATININE 0.89 11/27/2017 1203   CALCIUM 9.7 11/27/2017 1203    GFRNONAA 79 11/27/2017 1203   GFRAA 91 11/27/2017 1203   Lab Results  Component Value Date   HGBA1C 5.4 01/13/2019   HGBA1C 5.3 07/16/2018   HGBA1C 5.4 03/12/2018   HGBA1C 5.7 (H) 11/27/2017   Lab Results  Component Value Date   INSULIN 8.2 01/13/2019   INSULIN 8.2 07/16/2018   INSULIN 6.7 03/12/2018   INSULIN 12.2 11/27/2017   CBC    Component Value Date/Time   WBC 8.0 11/27/2017 1203   WBC 10.4 04/06/2015 1715   RBC 4.54 11/27/2017 1203   RBC 4.58 04/06/2015 1715   HGB 13.6 11/27/2017 1203   HCT 38.8 11/27/2017 1203   PLT 343 04/06/2015 1715   MCV 86 11/27/2017 1203   MCH 30.0 11/27/2017 1203   MCH 30.3 04/06/2015 1715   MCHC 35.1 11/27/2017 1203   MCHC 33.7 04/06/2015 1715   RDW 14.5 11/27/2017 1203   LYMPHSABS 2.2 11/27/2017 1203   MONOABS 0.6 04/06/2015 1715   EOSABS 0.2 11/27/2017 1203   BASOSABS 0.0 11/27/2017 1203   Iron/TIBC/Ferritin/ %Sat No results found for: IRON, TIBC, FERRITIN, IRONPCTSAT Lipid Panel     Component Value Date/Time   CHOL 196 01/13/2019 1028   TRIG 71 01/13/2019 1028   HDL 43 01/13/2019 1028   LDLCALC 139 (H) 01/13/2019 1028   Hepatic Function Panel     Component Value Date/Time   PROT 7.8 11/27/2017 1203   ALBUMIN 4.8 11/27/2017 1203   AST 20 11/27/2017 1203   ALT 20 11/27/2017 1203   ALKPHOS 42 11/27/2017 1203   BILITOT 0.4 11/27/2017 1203      Component Value Date/Time   TSH 0.667 11/27/2017 1203   TSH 0.406 05/26/2009 1040      OBESITY BEHAVIORAL INTERVENTION VISIT  Today's visit was # 27   Starting weight: 199 lbs Starting date: 11/27/17 Today's weight : 189 lbs Today's date: 03/11/2019 Total lbs lost to date: 10    ASK: We discussed the diagnosis of obesity with Robin Knapp today and Robin Knapp agreed to give [name] permission to discuss obesity behavioral modification  therapy today.  ASSESS: Robin Knapp has the diagnosis of obesity and her BMI today is 31.45 Robin Knapp is in the action stage of change    ADVISE: Robin Knapp was educated on the multiple health risks of obesity as well as the benefit of weight loss to improve her health. She was advised of the need for long term treatment and the importance of lifestyle modifications to improve her current health and to decrease her risk of future health problems.  AGREE: Multiple dietary modification options and treatment options were discussed and  Robin Knapp agreed to follow the recommendations documented in the above note.  ARRANGE: Robin Knapp was educated on the importance of frequent visits to treat obesity as outlined per CMS and USPSTF guidelines and agreed to schedule her next follow up appointment today.  I, Trixie Dredge, am acting as transcriptionist for Ilene Qua, MD  I have reviewed the above documentation for accuracy and completeness, and I agree with the above. - Ilene Qua, MD

## 2019-03-30 ENCOUNTER — Encounter (INDEPENDENT_AMBULATORY_CARE_PROVIDER_SITE_OTHER): Payer: Self-pay | Admitting: Family Medicine

## 2019-03-30 ENCOUNTER — Other Ambulatory Visit: Payer: Self-pay

## 2019-03-30 ENCOUNTER — Ambulatory Visit (INDEPENDENT_AMBULATORY_CARE_PROVIDER_SITE_OTHER): Payer: No Typology Code available for payment source | Admitting: Family Medicine

## 2019-03-30 VITALS — BP 133/75 | HR 83 | Temp 98.0°F | Ht 65.0 in | Wt 189.0 lb

## 2019-03-30 DIAGNOSIS — Z6831 Body mass index (BMI) 31.0-31.9, adult: Secondary | ICD-10-CM

## 2019-03-30 DIAGNOSIS — R7303 Prediabetes: Secondary | ICD-10-CM | POA: Diagnosis not present

## 2019-03-30 DIAGNOSIS — Z9189 Other specified personal risk factors, not elsewhere classified: Secondary | ICD-10-CM

## 2019-03-30 DIAGNOSIS — E669 Obesity, unspecified: Secondary | ICD-10-CM

## 2019-03-30 DIAGNOSIS — E7849 Other hyperlipidemia: Secondary | ICD-10-CM

## 2019-03-30 MED ORDER — METFORMIN HCL 500 MG PO TABS: 500.0000 mg | ORAL_TABLET | Freq: Every day | ORAL | 0 refills | Status: DC

## 2019-03-30 MED FILL — metFORMIN HCL 500 MG TABS: 500 | 90 days supply | Qty: 90 | Fill #0

## 2019-03-30 NOTE — Progress Notes (Signed)
Office: [phone]  /  Fax: [phone]   HPI:   Chief Complaint: OBESITY Robin Knapp is here to discuss her progress with her obesity treatment plan. She is on the Category 3 plan and is following her eating plan approximately 50 % of the time. She states she is walking for 30 minutes 3 times per week. [name] she has found it difficult to eat all of the food on the plan. She says that she has not been able to consume any full meals for the past 2 weeks.  Her weight is 189 lb (85.7 kg) today and has not lost weight since her last visit. She has lost 10 lbs since starting treatment with [name].  Pre-Diabetes Robin Knapp has a diagnosis of pre-diabetes based on her elevated Hgb A1c and was informed this puts her at greater risk of developing diabetes. She is still having some cravings and indulgent eating. She denies GI side effects of metformin. She continues to work on diet and exercise to decrease risk of diabetes.   At risk for diabetes Robin Knapp is at higher than average risk for developing diabetes due to her obesity and pre-diabetes. She currently denies polyuria or polydipsia.  Hyperlipidemia Robin Knapp has hyperlipidemia and has been trying to improve her cholesterol levels with intensive lifestyle modification including a low saturated fat diet, exercise and weight loss. Last labs were done on 01/13/2019, LDL was of 139. She is not on statin and denies any chest pain, claudication or myalgias.  ASSESSMENT AND PLAN:  Other hyperlipidemia  Prediabetes - Plan: metFORMIN (GLUCOPHAGE) 500 MG tablet  At risk for diabetes mellitus  Class 1 obesity with serious comorbidity and body mass index (BMI) of 31.0 to 31.9 in adult, unspecified obesity type  PLAN:  Pre-Diabetes Robin Knapp will continue to work on weight loss, exercise, and decreasing simple carbohydrates in her diet to help decrease the risk of diabetes. We dicussed metformin including benefits and risks. She was informed that  eating too many simple carbohydrates or too many calories at one sitting increases the likelihood of GI side effects. Robin Knapp agrees to continue taking metformin 500 mg PO q AM #90 with no refills. [name] agrees to follow up with our clinic in 2 weeks as directed to monitor her progress.  Diabetes risk counseling Robin Knapp was given extended (15 minutes) diabetes prevention counseling today. She is 48 y.o. female and has risk factors for diabetes including obesity and pre-diabetes. We discussed intensive lifestyle modifications today with an emphasis on weight loss as well as increasing exercise and decreasing simple carbohydrates in her diet.  Hyperlipidemia Robin Knapp was informed of the American Heart Association Guidelines emphasizing intensive lifestyle modifications as the first line treatment for hyperlipidemia. We discussed many lifestyle modifications today in depth, and [name] will continue to work on decreasing saturated fats such as fatty red meat, butter and many fried foods. She will also increase vegetables and lean protein in her diet and continue to work on exercise and weight loss efforts. We will repeat labs in mid August. Robin Knapp agrees to follow up with our clinic in 2 weeks.  Obesity Robin Knapp is currently in the action stage of change. As such, her goal is to continue with weight loss efforts She has agreed to follow the Category 3 plan Daily has been instructed to work up to a goal of 150 minutes of combined cardio and strengthening exercise per week for weight loss and overall health benefits. We discussed the following Behavioral Modification Strategies today: increasing lean protein  intake, increasing vegetables and work on meal planning and easy cooking plans, keeping healthy foods in the home, and planning for success   Robin Knapp has agreed to follow up with our clinic in 2 weeks. She was informed of the importance of frequent follow up visits to maximize her success with  intensive lifestyle modifications for her multiple health conditions.  ALLERGIES: Allergies  Allergen Reactions  . Sulfa Antibiotics Hives    itching  . Sunscreens     MEDICATIONS: Current Outpatient Medications on File Prior to Visit  Medication Sig Dispense Refill  . acetaminophen (TYLENOL 8 HOUR ARTHRITIS PAIN) 650 MG CR tablet Take 650 mg by mouth every 8 (eight) hours as needed for pain.    [name] albuterol (PROVENTIL HFA;VENTOLIN HFA) 108 (90 Base) MCG/ACT inhaler Inhale 2 puffs into the lungs every 6 (six) hours as needed for wheezing or shortness of breath.    . cetirizine (ZYRTEC) 10 MG tablet Take 10 mg by mouth daily.    . Cholecalciferol ([employer]S VITAMIN D3) 10000 units CAPS Take by mouth.    . gabapentin (NEURONTIN) 100 MG capsule Take 3 capsules (300 mg total) by mouth at bedtime. 90 capsule 11  . ibuprofen (ADVIL,MOTRIN) 600 MG tablet Take 600 mg by mouth every 6 (six) hours as needed.    [name] levonorgestrel (MIRENA) 20 MCG/24HR IUD 1 each by Intrauterine route once.    . Magnesium 250 MG TABS Take 1 tablet by mouth daily.    . montelukast (SINGULAIR) 10 MG tablet Take 10 mg by mouth at bedtime.    . Multiple Vitamins-Minerals (HAIR SKIN & NAILS ADVANCED PO) Take 1 tablet by mouth daily.    . Omega-3 Fatty Acids (OMEGA-3 FISH OIL) 1200 MG CAPS Take 2 capsules by mouth daily.    . Oral Electrolytes (REVITAL JELL CUPS PO) Take 1 capsule by mouth daily.     No current facility-administered medications on file prior to visit.     PAST MEDICAL HISTORY: Past Medical History:  Diagnosis Date  . Allergy   . Asthma   . Breast lump   . Constipation   . Dry skin   . Fatigue   . GERD (gastroesophageal reflux disease)    pregnancy related-zantac  . HTN (hypertension)   . Hyperlipidemia   . Infertility, female   . Joint pain   . Lactose intolerance   . Missed abortion    6weeks 3days  . Muscle pain   . Obesity   . Palpitations   . PCOS (polycystic ovarian syndrome)   .  Seasonal allergies   . Skin rash   . Stress   . Vitamin D deficiency     PAST SURGICAL HISTORY: Past Surgical History:  Procedure Laterality Date  . BREAST BIOPSY    . CESAREAN SECTION    . DILATION AND EVACUATION  05/09/2012   Procedure: DILATATION AND EVACUATION;  Surgeon: Marvene Staff, MD;  Location: [facility];  Service: Gynecology;  Laterality: N/A;  . EYE SURGERY    . LASIK    . [place]    . WISDOM TOOTH EXTRACTION  1994    SOCIAL HISTORY: Social History   Tobacco Use  . Smoking status: Never Smoker  . Smokeless tobacco: Never Used  Substance Use Topics  . Alcohol use: No    Alcohol/week: 0.0 standard drinks  . Drug use: No    FAMILY HISTORY: Family History  Problem Relation Age of Onset  . Hyperlipidemia Mother   . Hyperlipidemia Father   .  Hypertension Father   . Anxiety disorder Father   . Hyperlipidemia Maternal Grandmother   . Hypertension Maternal Grandmother   . Heart disease Maternal Grandmother   . Heart disease Maternal Grandfather   . Diabetes Maternal Grandfather   . Stroke Paternal Grandmother   . Mental illness Paternal Grandmother   . Heart disease Paternal Grandmother     ROS: Review of Systems  Constitutional: Negative for weight loss.  Cardiovascular: Negative for chest pain and claudication.  Genitourinary: Negative for frequency.  Musculoskeletal: Negative for myalgias.  Endo/Heme/Allergies: Negative for polydipsia.    PHYSICAL EXAM: Blood pressure 133/75, pulse 83, temperature 98 F (36.7 C), temperature source Oral, height 5\' 5"  (1.651 m), weight 189 lb (85.7 kg), SpO2 96 %. Body mass index is 31.45 kg/m. Physical Exam Vitals signs reviewed.  Constitutional:      Appearance: Normal appearance. She is obese.  Cardiovascular:     Rate and Rhythm: Normal rate.     Pulses: Normal pulses.  Pulmonary:     Effort: Pulmonary effort is normal.     Breath sounds: Normal breath sounds.  Musculoskeletal: Normal range of motion.   Skin:    General: Skin is warm and dry.  Neurological:     Mental Status: She is alert and oriented to person, place, and time.  Psychiatric:        Mood and Affect: Mood normal.        Behavior: Behavior normal.     RECENT LABS AND TESTS: BMET    Component Value Date/Time   NA 139 11/27/2017 1203   K 4.3 11/27/2017 1203   CL 99 11/27/2017 1203   CO2 24 11/27/2017 1203   GLUCOSE 84 11/27/2017 1203   GLUCOSE 91 04/06/2015 1715   BUN 11 11/27/2017 1203   CREATININE 0.89 11/27/2017 1203   CALCIUM 9.7 11/27/2017 1203   GFRNONAA 79 11/27/2017 1203   GFRAA 91 11/27/2017 1203   Lab Results  Component Value Date   HGBA1C 5.4 01/13/2019   HGBA1C 5.3 07/16/2018   HGBA1C 5.4 03/12/2018   HGBA1C 5.7 (H) 11/27/2017   Lab Results  Component Value Date   INSULIN 8.2 01/13/2019   INSULIN 8.2 07/16/2018   INSULIN 6.7 03/12/2018   INSULIN 12.2 11/27/2017   CBC    Component Value Date/Time   WBC 8.0 11/27/2017 1203   WBC 10.4 04/06/2015 1715   RBC 4.54 11/27/2017 1203   RBC 4.58 04/06/2015 1715   HGB 13.6 11/27/2017 1203   HCT 38.8 11/27/2017 1203   PLT 343 04/06/2015 1715   MCV 86 11/27/2017 1203   MCH 30.0 11/27/2017 1203   MCH 30.3 04/06/2015 1715   MCHC 35.1 11/27/2017 1203   MCHC 33.7 04/06/2015 1715   RDW 14.5 11/27/2017 1203   LYMPHSABS 2.2 11/27/2017 1203   MONOABS 0.6 04/06/2015 1715   EOSABS 0.2 11/27/2017 1203   BASOSABS 0.0 11/27/2017 1203   Iron/TIBC/Ferritin/ %Sat No results found for: IRON, TIBC, FERRITIN, IRONPCTSAT Lipid Panel     Component Value Date/Time   CHOL 196 01/13/2019 1028   TRIG 71 01/13/2019 1028   HDL 43 01/13/2019 1028   LDLCALC 139 (H) 01/13/2019 1028   Hepatic Function Panel     Component Value Date/Time   PROT 7.8 11/27/2017 1203   ALBUMIN 4.8 11/27/2017 1203   AST 20 11/27/2017 1203   ALT 20 11/27/2017 1203   ALKPHOS 42 11/27/2017 1203   BILITOT 0.4 11/27/2017 1203      Component Value  Date/Time   TSH 0.667 11/27/2017  1203   TSH 0.406  05/26/2009 1040      OBESITY BEHAVIORAL INTERVENTION VISIT  Today's visit was # 28   Starting weight: 199 lbs Starting date: 11/27/17 Today's weight : 189 lbs Today's date: 03/30/2019 Total lbs lost to date: 10    ASK: We discussed the diagnosis of obesity with Graceann Congress today and Dyanna agreed to give [name] permission to discuss obesity behavioral modification therapy today.  ASSESS: Roshni has the diagnosis of obesity and her BMI today is 31.45 Arminta is in the action stage of change   ADVISE: Tametra was educated on the multiple health risks of obesity as well as the benefit of weight loss to improve her health. She was advised of the need for long term treatment and the importance of lifestyle modifications to improve her current health and to decrease her risk of future health problems.  AGREE: Multiple dietary modification options and treatment options were discussed and  Makana agreed to follow the recommendations documented in the above note.  ARRANGE: Carriann was educated on the importance of frequent visits to treat obesity as outlined per CMS and USPSTF guidelines and agreed to schedule her next follow up appointment today.  I, Trixie Dredge, am acting as transcriptionist for Ilene Qua, MD  I have reviewed the above documentation for accuracy and completeness, and I agree with the above. - Ilene Qua, MD

## 2019-04-14 ENCOUNTER — Ambulatory Visit (INDEPENDENT_AMBULATORY_CARE_PROVIDER_SITE_OTHER): Payer: No Typology Code available for payment source | Admitting: Family Medicine

## 2019-04-14 ENCOUNTER — Other Ambulatory Visit: Payer: Self-pay

## 2019-04-14 ENCOUNTER — Encounter (INDEPENDENT_AMBULATORY_CARE_PROVIDER_SITE_OTHER): Payer: Self-pay | Admitting: Family Medicine

## 2019-04-14 VITALS — BP 126/81 | HR 90 | Temp 98.5°F | Ht 65.0 in | Wt 192.0 lb

## 2019-04-14 DIAGNOSIS — E7849 Other hyperlipidemia: Secondary | ICD-10-CM

## 2019-04-14 DIAGNOSIS — Z6832 Body mass index (BMI) 32.0-32.9, adult: Secondary | ICD-10-CM

## 2019-04-14 DIAGNOSIS — R7303 Prediabetes: Secondary | ICD-10-CM

## 2019-04-14 DIAGNOSIS — E669 Obesity, unspecified: Secondary | ICD-10-CM | POA: Diagnosis not present

## 2019-04-14 NOTE — Progress Notes (Signed)
Office: [phone]  /  Fax: [phone]   HPI:   Chief Complaint: OBESITY Robin Knapp is here to discuss her progress with her obesity treatment plan. She is on the Category 3 plan and is following her eating plan approximately 60 % of the time. She states she is walking for 30-45 minutes 2 times per week. Robin Knapp had a harder few weeks, not getting in 1500 calories and not feeling hungry. She reports she feels stuck at 1200 calories and she can't get over mind block of eating more to lose.  Her weight is 192 lb (87.1 kg) today and has gained 3 lbs since her last visit. She has lost 7 lbs since starting treatment with [name].  Pre-Diabetes Robin Knapp has a diagnosis of pre-diabetes based on her elevated Hgb A1c and was informed this puts her at greater risk of developing diabetes. Last Hgb A1c was of 5.4 and insulin of 8.2. She is taking metformin currently and continues to work on diet and exercise to decrease risk of diabetes.   Hyperlipidemia Robin Knapp has hyperlipidemia and has been trying to improve her cholesterol levels with intensive lifestyle modification including a low saturated fat diet, exercise and weight loss. Last LDL was of 139 and HDL of 43. She is not on statin and denies any chest pain, claudication or myalgias.  ASSESSMENT AND PLAN:  Prediabetes  Other hyperlipidemia  Class 1 obesity with serious comorbidity and body mass index (BMI) of 32.0 to 32.9 in adult, unspecified obesity type  PLAN:  Pre-Diabetes Robin Knapp will continue to work on weight loss, exercise, and decreasing simple carbohydrates in her diet to help decrease the risk of diabetes. We dicussed metformin including benefits and risks. She was informed that eating too many simple carbohydrates or too many calories at one sitting increases the likelihood of GI side effects. Summar agrees to continue taking metformin, and she agrees to follow up with our clinic in 2 weeks as directed to monitor her progress.   Hyperlipidemia Robin Knapp was informed of the American Heart Association Guidelines emphasizing intensive lifestyle modifications as the first line treatment for hyperlipidemia. We discussed many lifestyle modifications today in depth, and Robin Knapp will continue to work on decreasing saturated fats such as fatty red meat, butter and many fried foods. She will also increase vegetables and lean protein in her diet and continue to work on exercise and weight loss efforts. We will repeat labs in September. Robin Knapp agrees to follow up with our clinic in 2 weeks.  Obesity Robin Knapp is currently in the action stage of change. As such, her goal is to continue with weight loss efforts She has agreed to keep a food journal with 1350-1450 calories and 90+ grams of protein daily Robin Knapp has been instructed to work up to a goal of 150 minutes of combined cardio and strengthening exercise per week for weight loss and overall health benefits. We discussed the following Behavioral Modification Strategies today: increasing lean protein intake, increasing vegetables and work on meal planning and easy cooking plans, keeping healthy foods in the home, and planning for success   Robin Knapp has agreed to follow up with our clinic in 2 weeks. She was informed of the importance of frequent follow up visits to maximize her success with intensive lifestyle modifications for her multiple health conditions.  ALLERGIES: Allergies  Allergen Reactions  . Sulfa Antibiotics Hives    itching  . Sunscreens     MEDICATIONS: Current Outpatient Medications on File Prior to Visit  Medication Sig Dispense  Refill  . acetaminophen (TYLENOL 8 HOUR ARTHRITIS PAIN) 650 MG CR tablet Take 650 mg by mouth every 8 (eight) hours as needed for pain.    [name] albuterol (PROVENTIL HFA;VENTOLIN HFA) 108 (90 Base) MCG/ACT inhaler Inhale 2 puffs into the lungs every 6 (six) hours as needed for wheezing or shortness of breath.    . cetirizine (ZYRTEC) 10 MG  tablet Take 10 mg by mouth daily.    . Cholecalciferol ([employer] VITAMIN D3) 10000 units CAPS Take by mouth.    . gabapentin (NEURONTIN) 100 MG capsule Take 3 capsules (300 mg total) by mouth at bedtime. 90 capsule 11  . ibuprofen (ADVIL,MOTRIN) 600 MG tablet Take 600 mg by mouth every 6 (six) hours as needed.    [name] levonorgestrel (MIRENA) 20 MCG/24HR IUD 1 each by Intrauterine route once.    . Magnesium 250 MG TABS Take 1 tablet by mouth daily.    . metFORMIN (GLUCOPHAGE) 500 MG tablet Take 1 tablet (500 mg total) by mouth daily with breakfast. 90 tablet 0  . montelukast (SINGULAIR) 10 MG tablet Take 10 mg by mouth at bedtime.    . Multiple Vitamins-Minerals (HAIR SKIN & NAILS ADVANCED PO) Take 1 tablet by mouth daily.    . Omega-3 Fatty Acids (OMEGA-3 FISH OIL) 1200 MG CAPS Take 2 capsules by mouth daily.    . Oral Electrolytes (REVITAL JELL CUPS PO) Take 1 capsule by mouth daily.     No current facility-administered medications on file prior to visit.     PAST MEDICAL HISTORY: Past Medical History:  Diagnosis Date  . Allergy   . Asthma   . Breast lump   . Constipation   . Dry skin   . Fatigue   . GERD (gastroesophageal reflux disease)    pregnancy related-zantac  . HTN (hypertension)   . Hyperlipidemia   . Infertility, female   . Joint pain   . Lactose intolerance   . Missed abortion    6weeks 3days  . Muscle pain   . Obesity   . Palpitations   . PCOS (polycystic ovarian syndrome)   . Seasonal allergies   . Skin rash   . Stress   . Vitamin D deficiency     PAST SURGICAL HISTORY: Past Surgical History:  Procedure Laterality Date  . BREAST BIOPSY    . CESAREAN SECTION    . DILATION AND EVACUATION  05/09/2012   Procedure: DILATATION AND EVACUATION;  Surgeon: Marvene Staff, MD;  Location: [facility];  Service: Gynecology;  Laterality: N/A;  . EYE SURGERY    . LASIK    . [place]    . WISDOM TOOTH EXTRACTION  1994    SOCIAL HISTORY: Social History   Tobacco Use  .  Smoking status: Never Smoker  . Smokeless tobacco: Never Used  Substance Use Topics  . Alcohol use: No    Alcohol/week: 0.0 standard drinks  . Drug use: No    FAMILY HISTORY: Family History  Problem Relation Age of Onset  . Hyperlipidemia Mother   . Hyperlipidemia Father   . Hypertension Father   . Anxiety disorder Father   . Hyperlipidemia Maternal Grandmother   . Hypertension Maternal Grandmother   . Heart disease Maternal Grandmother   . Heart disease Maternal Grandfather   . Diabetes Maternal Grandfather   . Stroke Paternal Grandmother   . Mental illness Paternal Grandmother   . Heart disease Paternal Grandmother     ROS: Review of Systems  Constitutional: Negative  for weight loss.  Cardiovascular: Negative for chest pain and claudication.  Musculoskeletal: Negative for myalgias.    PHYSICAL EXAM: Blood pressure 126/81, pulse 90, temperature 98.5 F (36.9 C), temperature source Oral, height 5\' 5"  (1.651 m), weight 192 lb (87.1 kg), SpO2 97 %. Body mass index is 31.95 kg/m. Physical Exam Vitals signs reviewed.  Constitutional:      Appearance: Normal appearance. She is obese.  Cardiovascular:     Rate and Rhythm: Normal rate.     Pulses: Normal pulses.  Pulmonary:     Effort: Pulmonary effort is normal.     Breath sounds: Normal breath sounds.  Musculoskeletal: Normal range of motion.  Skin:    General: Skin is warm and dry.  Neurological:     Mental Status: She is alert and oriented to person, place, and time.  Psychiatric:        Mood and Affect: Mood normal.        Behavior: Behavior normal.     RECENT LABS AND TESTS: BMET    Component Value Date/Time   NA 139 11/27/2017 1203   K 4.3 11/27/2017 1203   CL 99 11/27/2017 1203   CO2 24 11/27/2017 1203   GLUCOSE 84 11/27/2017 1203   GLUCOSE 91 04/06/2015 1715   BUN 11 11/27/2017 1203   CREATININE 0.89 11/27/2017 1203   CALCIUM 9.7 11/27/2017 1203   GFRNONAA 79 11/27/2017 1203   GFRAA 91  11/27/2017 1203   Lab Results  Component Value Date   HGBA1C 5.4 01/13/2019   HGBA1C 5.3 07/16/2018   HGBA1C 5.4 03/12/2018   HGBA1C 5.7 (H) 11/27/2017   Lab Results  Component Value Date   INSULIN 8.2 01/13/2019   INSULIN 8.2 07/16/2018   INSULIN 6.7 03/12/2018   INSULIN 12.2 11/27/2017   CBC    Component Value Date/Time   WBC 8.0 11/27/2017 1203   WBC 10.4 04/06/2015 1715   RBC 4.54 11/27/2017 1203   RBC 4.58 04/06/2015 1715   HGB 13.6 11/27/2017 1203   HCT 38.8 11/27/2017 1203   PLT 343 04/06/2015 1715   MCV 86 11/27/2017 1203   MCH 30.0 11/27/2017 1203   MCH 30.3 04/06/2015 1715   MCHC 35.1 11/27/2017 1203   MCHC 33.7 04/06/2015 1715   RDW 14.5 11/27/2017 1203   LYMPHSABS 2.2 11/27/2017 1203   MONOABS 0.6 04/06/2015 1715   EOSABS 0.2 11/27/2017 1203   BASOSABS 0.0 11/27/2017 1203   Iron/TIBC/Ferritin/ %Sat No results found for: IRON, TIBC, FERRITIN, IRONPCTSAT Lipid Panel     Component Value Date/Time   CHOL 196 01/13/2019 1028   TRIG 71 01/13/2019 1028   HDL 43 01/13/2019 1028   LDLCALC 139 (H) 01/13/2019 1028   Hepatic Function Panel     Component Value Date/Time   PROT 7.8 11/27/2017 1203   ALBUMIN 4.8 11/27/2017 1203   AST 20 11/27/2017 1203   ALT 20 11/27/2017 1203   ALKPHOS 42 11/27/2017 1203   BILITOT 0.4 11/27/2017 1203      Component Value Date/Time   TSH 0.667 11/27/2017 1203   TSH 0.406  05/26/2009 1040      OBESITY BEHAVIORAL INTERVENTION VISIT  Today's visit was # 29   Starting weight: 199 lbs Starting date: 11/27/17 Today's weight :192 lbs Today's date: 04/14/2019 Total lbs lost to date: 7    ASK: We discussed the diagnosis of obesity with Robin Knapp today and Robin Knapp agreed to give [name] permission to discuss obesity behavioral modification therapy today.  ASSESS: Klarisa has the diagnosis of obesity and her BMI today is 31.95 Kiyomi is in the action stage of change   ADVISE: Bobby was educated on the multiple  health risks of obesity as well as the benefit of weight loss to improve her health. She was advised of the need for long term treatment and the importance of lifestyle modifications to improve her current health and to decrease her risk of future health problems.  AGREE: Multiple dietary modification options and treatment options were discussed and  Robin Knapp agreed to follow the recommendations documented in the above note.  ARRANGE: Ermel was educated on the importance of frequent visits to treat obesity as outlined per CMS and USPSTF guidelines and agreed to schedule her next follow up appointment today.  I, Trixie Dredge, am acting as transcriptionist for Ilene Qua, MD  I have reviewed the above documentation for accuracy and completeness, and I agree with the above. - Ilene Qua, MD

## 2019-04-28 ENCOUNTER — Ambulatory Visit (INDEPENDENT_AMBULATORY_CARE_PROVIDER_SITE_OTHER): Payer: No Typology Code available for payment source | Admitting: Family Medicine

## 2019-04-28 ENCOUNTER — Other Ambulatory Visit: Payer: Self-pay

## 2019-04-28 ENCOUNTER — Encounter (INDEPENDENT_AMBULATORY_CARE_PROVIDER_SITE_OTHER): Payer: Self-pay | Admitting: Family Medicine

## 2019-04-28 VITALS — BP 111/75 | HR 78 | Temp 98.0°F | Ht 65.0 in | Wt 190.0 lb

## 2019-04-28 DIAGNOSIS — Z9189 Other specified personal risk factors, not elsewhere classified: Secondary | ICD-10-CM

## 2019-04-28 DIAGNOSIS — R7303 Prediabetes: Secondary | ICD-10-CM

## 2019-04-28 DIAGNOSIS — E669 Obesity, unspecified: Secondary | ICD-10-CM

## 2019-04-28 DIAGNOSIS — E559 Vitamin D deficiency, unspecified: Secondary | ICD-10-CM

## 2019-04-28 DIAGNOSIS — Z6831 Body mass index (BMI) 31.0-31.9, adult: Secondary | ICD-10-CM

## 2019-04-28 MED ORDER — METFORMIN HCL 500 MG PO TABS: 500.0000 mg | ORAL_TABLET | Freq: Every day | ORAL | 0 refills | Status: DC

## 2019-04-28 MED FILL — metFORMIN HCL 500 MG TABS: 500 | 90 days supply | Qty: 90 | Fill #0

## 2019-04-30 ENCOUNTER — Other Ambulatory Visit (INDEPENDENT_AMBULATORY_CARE_PROVIDER_SITE_OTHER): Payer: Self-pay | Admitting: Primary Care

## 2019-04-30 MED ORDER — NITROFURANTOIN MONOHYD MACRO 100 MG PO CAPS: 100.0000 mg | ORAL_CAPSULE | Freq: Two times a day (BID) | ORAL | 1 refills | Status: AC

## 2019-04-30 MED ORDER — PHENAZOPYRIDINE HCL 100 MG PO TABS: 100.0000 mg | ORAL_TABLET | Freq: Three times a day (TID) | ORAL | 0 refills | Status: DC | PRN

## 2019-04-30 MED FILL — PHENAZOPYRIDINE 100 MG TAB: 100 | 3 days supply | Qty: 10 | Fill #0

## 2019-04-30 MED FILL — NITROFURANTOIN MONO-MCR 100: 100 | 7 days supply | Qty: 14 | Fill #0

## 2019-04-30 NOTE — Progress Notes (Unsigned)
micro

## 2019-04-30 NOTE — Progress Notes (Signed)
Office: [phone]  /  Fax: [phone]   HPI:   Chief Complaint: OBESITY Robin Knapp is here to discuss her progress with her obesity treatment plan. She is on the keep a food journal with 1350 to 1450 calories and 90+ grams of protein daily and she is following her eating plan approximately 60 % of the time. She states she is walking 60 minutes 2 times per week. Robin Knapp voices that she has been more mindful of her food choices, and she is trying to eat fresh. Robin Knapp has food and kraft egg white mayonnaise to make chicken salad. Her weight is 190 lb (86.2 kg) today and has had a weight loss of 2 pounds over a period of 2 weeks since her last visit. She has lost 9 lbs since starting treatment with [name].  Pre-Diabetes [name] has a diagnosis of prediabetes based on her elevated Hgb A1c and was informed this puts her at greater risk of developing diabetes. Robin Knapp denies any GI side effects of metformin. She continues to work on diet and exercise to decrease risk of diabetes. Robin Knapp has minimal carb cravings.  At risk for diabetes Robin Knapp is at higher than average risk for developing diabetes due to her obesity and prediabetes. She currently denies polyuria or polydipsia.  Vitamin D deficiency Robin Knapp has a diagnosis of vitamin D deficiency. She is currently taking OTC vit D and her last vitamin D level was at 60.5 Robin Knapp admits fatigue and she denies nausea, vomiting or muscle weakness.  ASSESSMENT AND PLAN:  At risk for diabetes mellitus  Prediabetes - Plan: metFORMIN (GLUCOPHAGE) 500 MG tablet  Vitamin D deficiency  Class 1 obesity with serious comorbidity and body mass index (BMI) of 31.0 to 31.9 in adult, unspecified obesity type  PLAN:  Pre-Diabetes Robin Knapp will continue to work on weight loss, exercise, and decreasing simple carbohydrates in her diet to help decrease the risk of diabetes. We dicussed metformin including benefits and risks. She was informed that eating too  many simple carbohydrates or too many calories at one sitting increases the likelihood of GI side effects. Robin Knapp agrees to continue metformin 500 mg daily with breakfast #90 with no refills and follow up with [name] as directed to monitor her progress.  Diabetes risk counseling Robin Knapp was given extended (15 minutes) diabetes prevention counseling today. She is 48 y.o. female and has risk factors for diabetes including obesity and prediabetes. We discussed intensive lifestyle modifications today with an emphasis on weight loss as well as increasing exercise and decreasing simple carbohydrates in her diet.  Vitamin D Deficiency Robin Knapp was informed that low vitamin D levels contributes to fatigue and are associated with obesity, breast, and colon cancer. Robin Knapp will continue to take prescription OTC Vit D and she will follow up for routine testing of vitamin D, at least 2-3 times per year. She was informed of the risk of over-replacement of vitamin D and agrees to not increase her dose unless she discusses this with [name] first.  Obesity Robin Knapp is currently in the action stage of change. As such, her goal is to continue with weight loss efforts She has agreed to keep a food journal with 1350 to 1450 calories and 85+ grams of protein daily Robin Knapp has been instructed to work up to a goal of 150 minutes of combined cardio and strengthening exercise per week for weight loss and overall health benefits. We discussed the following Behavioral Modification Strategies today: planning for success, keeping healthy foods in the home, better snacking  choices, increasing lean protein intake, increasing vegetables and work on meal planning and easy cooking plans  Robin Knapp has agreed to follow up with our clinic in 2 weeks. She was informed of the importance of frequent follow up visits to maximize her success with intensive lifestyle modifications for her multiple health conditions.  ALLERGIES: Allergies  Allergen  Reactions  . Sulfa Antibiotics Hives    itching  . Sunscreens     MEDICATIONS: Current Outpatient Medications on File Prior to Visit  Medication Sig Dispense Refill  . acetaminophen (TYLENOL 8 HOUR ARTHRITIS PAIN) 650 MG CR tablet Take 650 mg by mouth every 8 (eight) hours as needed for pain.    [name] albuterol (PROVENTIL HFA;VENTOLIN HFA) 108 (90 Base) MCG/ACT inhaler Inhale 2 puffs into the lungs every 6 (six) hours as needed for wheezing or shortness of breath.    . cetirizine (ZYRTEC) 10 MG tablet Take 10 mg by mouth daily.    . Cholecalciferol ([employer]S VITAMIN D3) 10000 units CAPS Take by mouth.    . gabapentin (NEURONTIN) 100 MG capsule Take 3 capsules (300 mg total) by mouth at bedtime. 90 capsule 11  . ibuprofen (ADVIL,MOTRIN) 600 MG tablet Take 600 mg by mouth every 6 (six) hours as needed.    [name] levonorgestrel (MIRENA) 20 MCG/24HR IUD 1 each by Intrauterine route once.    . Magnesium 250 MG TABS Take 1 tablet by mouth daily.    . montelukast (SINGULAIR) 10 MG tablet Take 10 mg by mouth at bedtime.    . Multiple Vitamins-Minerals (HAIR SKIN & NAILS ADVANCED PO) Take 1 tablet by mouth daily.    . Omega-3 Fatty Acids (OMEGA-3 FISH OIL) 1200 MG CAPS Take 2 capsules by mouth daily.    . Oral Electrolytes (REVITAL JELL CUPS PO) Take 1 capsule by mouth daily.     No current facility-administered medications on file prior to visit.     PAST MEDICAL HISTORY: Past Medical History:  Diagnosis Date  . Allergy   . Asthma   . Breast lump   . Constipation   . Dry skin   . Fatigue   . GERD (gastroesophageal reflux disease)    pregnancy related-zantac  . HTN (hypertension)   . Hyperlipidemia   . Infertility, female   . Joint pain   . Lactose intolerance   . Missed abortion    6weeks 3days  . Muscle pain   . Obesity   . Palpitations   . PCOS (polycystic ovarian syndrome)   . Seasonal allergies   . Skin rash   . Stress   . Vitamin D deficiency     PAST SURGICAL HISTORY: Past  Surgical History:  Procedure Laterality Date  . BREAST BIOPSY    . CESAREAN SECTION    . DILATION AND EVACUATION  05/09/2012   Procedure: DILATATION AND EVACUATION;  Surgeon: Marvene Staff, MD;  Location: [facility];  Service: Gynecology;  Laterality: N/A;  . EYE SURGERY    . LASIK    . [place]    . WISDOM TOOTH EXTRACTION  1994    SOCIAL HISTORY: Social History   Tobacco Use  . Smoking status: Never Smoker  . Smokeless tobacco: Never Used  Substance Use Topics  . Alcohol use: No    Alcohol/week: 0.0 standard drinks  . Drug use: No    FAMILY HISTORY: Family History  Problem Relation Age of Onset  . Hyperlipidemia Mother   . Hyperlipidemia Father   . Hypertension Father   .  Anxiety disorder Father   . Hyperlipidemia Maternal Grandmother   . Hypertension Maternal Grandmother   . Heart disease Maternal Grandmother   . Heart disease Maternal Grandfather   . Diabetes Maternal Grandfather   . Stroke Paternal Grandmother   . Mental illness Paternal Grandmother   . Heart disease Paternal Grandmother     ROS: Review of Systems  Constitutional: Positive for malaise/fatigue and weight loss.  Gastrointestinal: Negative for diarrhea, nausea and vomiting.  Genitourinary: Negative for frequency.  Musculoskeletal:       Negative for muscle weakness  Endo/Heme/Allergies: Negative for polydipsia.    PHYSICAL EXAM: Blood pressure 111/75, pulse 78, temperature 98 F (36.7 C), temperature source Oral, height 5\' 5"  (1.651 m), weight 190 lb (86.2 kg), SpO2 96 %. Body mass index is 31.62 kg/m. Physical Exam Vitals signs reviewed.  Constitutional:      Appearance: Normal appearance. She is well-developed. She is obese.  Cardiovascular:     Rate and Rhythm: Normal rate.  Pulmonary:     Effort: Pulmonary effort is normal.  Musculoskeletal: Normal range of motion.  Skin:    General: Skin is warm and dry.  Neurological:     Mental Status: She is alert and oriented to person,  place, and time.  Psychiatric:        Mood and Affect: Mood normal.        Behavior: Behavior normal.     RECENT LABS AND TESTS: BMET    Component Value Date/Time   NA 139 11/27/2017 1203   K 4.3 11/27/2017 1203   CL 99 11/27/2017 1203   CO2 24 11/27/2017 1203   GLUCOSE 84 11/27/2017 1203   GLUCOSE 91 04/06/2015 1715   BUN 11 11/27/2017 1203   CREATININE 0.89 11/27/2017 1203   CALCIUM 9.7 11/27/2017 1203   GFRNONAA 79 11/27/2017 1203   GFRAA 91 11/27/2017 1203   Lab Results  Component Value Date   HGBA1C 5.4 01/13/2019   HGBA1C 5.3 07/16/2018   HGBA1C 5.4 03/12/2018   HGBA1C 5.7 (H) 11/27/2017   Lab Results  Component Value Date   INSULIN 8.2 01/13/2019   INSULIN 8.2 07/16/2018   INSULIN 6.7 03/12/2018   INSULIN 12.2 11/27/2017   CBC    Component Value Date/Time   WBC 8.0 11/27/2017 1203   WBC 10.4 04/06/2015 1715   RBC 4.54 11/27/2017 1203   RBC 4.58 04/06/2015 1715   HGB 13.6 11/27/2017 1203   HCT 38.8 11/27/2017 1203   PLT 343 04/06/2015 1715   MCV 86 11/27/2017 1203   MCH 30.0 11/27/2017 1203   MCH 30.3 04/06/2015 1715   MCHC 35.1 11/27/2017 1203   MCHC 33.7 04/06/2015 1715   RDW 14.5 11/27/2017 1203   LYMPHSABS 2.2 11/27/2017 1203   MONOABS 0.6 04/06/2015 1715   EOSABS 0.2 11/27/2017 1203   BASOSABS 0.0 11/27/2017 1203   Iron/TIBC/Ferritin/ %Sat No results found for: IRON, TIBC, FERRITIN, IRONPCTSAT Lipid Panel     Component Value Date/Time   CHOL 196 01/13/2019 1028   TRIG 71 01/13/2019 1028   HDL 43 01/13/2019 1028   LDLCALC 139 (H) 01/13/2019 1028   Hepatic Function Panel     Component Value Date/Time   PROT 7.8 11/27/2017 1203   ALBUMIN 4.8 11/27/2017 1203   AST 20 11/27/2017 1203   ALT 20 11/27/2017 1203   ALKPHOS 42 11/27/2017 1203   BILITOT 0.4 11/27/2017 1203      Component Value Date/Time   TSH 0.667 11/27/2017 1203  TSH  05/26/2009 1040     Ref. Range 01/13/2019 10:28  Vitamin D, 25-Hydroxy Latest Ref Range: 30.0 -  100.0 ng/mL 60.5    OBESITY BEHAVIORAL INTERVENTION VISIT  Today's visit was # 30   Starting weight: 199 lbs Starting date: 11/27/2017 Today's weight : 190 lbs Today's date: 04/28/2019 Total lbs lost to date: 9    04/28/2019  Height 5\' 5"  (1.651 m)  Weight 190 lb (86.2 kg)  BMI (Calculated) 31.62  BLOOD PRESSURE - SYSTOLIC 102  BLOOD PRESSURE - DIASTOLIC 75   Body Fat % 72.5 %  Total Body Water (lbs) 80 lbs    ASK: We discussed the diagnosis of obesity with Robin Knapp today and Robin Knapp agreed to give [name] permission to discuss obesity behavioral modification therapy today.  ASSESS: Robin Knapp has the diagnosis of obesity and her BMI today is 31.62 Robin Knapp is in the action stage of change   ADVISE: Robin Knapp was educated on the multiple health risks of obesity as well as the benefit of weight loss to improve her health. She was advised of the need for long term treatment and the importance of lifestyle modifications to improve her current health and to decrease her risk of future health problems.  AGREE: Multiple dietary modification options and treatment options were discussed and  Robin Knapp agreed to follow the recommendations documented in the above note.  ARRANGE: Robin Knapp was educated on the importance of frequent visits to treat obesity as outlined per CMS and USPSTF guidelines and agreed to schedule her next follow up appointment today.  I, Doreene Nest, am acting as transcriptionist for Eber Jones, MD  I have reviewed the above documentation for accuracy and completeness, and I agree with the above. - Ilene Qua, MD

## 2019-05-12 ENCOUNTER — Encounter (INDEPENDENT_AMBULATORY_CARE_PROVIDER_SITE_OTHER): Payer: Self-pay | Admitting: Family Medicine

## 2019-05-12 ENCOUNTER — Ambulatory Visit (INDEPENDENT_AMBULATORY_CARE_PROVIDER_SITE_OTHER): Payer: No Typology Code available for payment source | Admitting: Family Medicine

## 2019-05-12 ENCOUNTER — Other Ambulatory Visit: Payer: Self-pay

## 2019-05-12 VITALS — BP 123/80 | HR 82 | Temp 98.5°F | Ht 65.0 in | Wt 190.0 lb

## 2019-05-12 DIAGNOSIS — Z6831 Body mass index (BMI) 31.0-31.9, adult: Secondary | ICD-10-CM

## 2019-05-12 DIAGNOSIS — E559 Vitamin D deficiency, unspecified: Secondary | ICD-10-CM

## 2019-05-12 DIAGNOSIS — E669 Obesity, unspecified: Secondary | ICD-10-CM | POA: Diagnosis not present

## 2019-05-12 DIAGNOSIS — R7303 Prediabetes: Secondary | ICD-10-CM

## 2019-05-13 NOTE — Progress Notes (Signed)
Office: [phone]  /  Fax: [phone]   HPI:   Chief Complaint: OBESITY Robin Knapp is here to discuss her progress with her obesity treatment plan. She is keeping a food journal with 1350-1450 calories and 85 grams of protein and is following her eating plan approximately 60% of the time. She states she is walking 60 minutes 2 times per week. Robin Knapp had a good few weeks but really struggled to eat as many calories on plan as she is supposed to. She is often feeling full and then pushing meals off, so not eating all calories (often maxing out on 1200 calories). Her weight is 190 lb (86.2 kg) today and has not lost weight since her last visit. She has lost 9 lbs since starting treatment with [name].  Pre-Diabetes Robin Knapp has a diagnosis of prediabetes based on her elevated Hgb A1c and was informed this puts her at greater risk of developing diabetes. She is taking metformin currently with no GI side effects. She continues to work on diet and exercise to decrease risk of diabetes.   Vitamin D deficiency Robin Knapp has a diagnosis of Vitamin D deficiency. She is currently taking OTC Vit D and denies nausea, vomiting or muscle weakness but does admit to fatigue.  ASSESSMENT AND PLAN:  Prediabetes  Vitamin D deficiency  Class 1 obesity with serious comorbidity and body mass index (BMI) of 31.0 to 31.9 in adult, unspecified obesity type  PLAN:  Pre-Diabetes Robin Knapp will continue to work on weight loss, exercise, and decreasing simple carbohydrates in her diet to help decrease the risk of diabetes. We dicussed metformin including benefits and risks. She was informed that eating too many simple carbohydrates or too many calories at one sitting increases the likelihood of GI side effects. Robin Knapp will continue metformin (no refill needed). She will follow-up with [name] as directed to monitor her progress.  Vitamin D Deficiency Robin Knapp was informed that low Vitamin D levels contributes to fatigue and  are associated with obesity, breast, and colon cancer. She agrees to continue taking OTC Vit D and will follow-up for routine testing of Vitamin D in September. She was informed of the risk of over-replacement of Vitamin D and agrees to not increase her dose unless she discusses this with [name] first. Robin Knapp agrees to follow-up with our clinic in 2 weeks.  I spent > than 50% of the 15 minute visit on counseling as documented in the note.  Obesity Robin Knapp is currently in the action stage of change. As such, her goal is to continue with weight loss efforts. She has agreed to keep a food journal with 1350-1450 calories and 85-90+ grams of protein daily. Robin Knapp has been instructed to work up to a goal of 150 minutes of combined cardio and strengthening exercise per week for weight loss and overall health benefits. We discussed the following Behavioral Modification Strategies today: increasing lean protein intake, increasing vegetables and work on meal planning and easy cooking plans, keeping healthy foods in the home, planning for success, and keep a strict food journal.  Robin Knapp has agreed to follow-up with our clinic in 2 weeks. She was informed of the importance of frequent follow-up visits to maximize her success with intensive lifestyle modifications for her multiple health conditions.  ALLERGIES: Allergies  Allergen Reactions  . Sulfa Antibiotics Hives    itching  . Sunscreens     MEDICATIONS: Current Outpatient Medications on File Prior to Visit  Medication Sig Dispense Refill  . acetaminophen (TYLENOL 8 HOUR ARTHRITIS  PAIN) 650 MG CR tablet Take 650 mg by mouth every 8 (eight) hours as needed for pain.    [name] albuterol (PROVENTIL HFA;VENTOLIN HFA) 108 (90 Base) MCG/ACT inhaler Inhale 2 puffs into the lungs every 6 (six) hours as needed for wheezing or shortness of breath.    . Cholecalciferol ([employer] VITAMIN D3) 10000 units CAPS Take by mouth.    . fexofenadine-pseudoephedrine (ALLEGRA-D)  60-120 MG 12 hr tablet Take 1 tablet by mouth 2 (two) times daily.    [name] gabapentin (NEURONTIN) 100 MG capsule Take 3 capsules (300 mg total) by mouth at bedtime. 90 capsule 11  . ibuprofen (ADVIL,MOTRIN) 600 MG tablet Take 600 mg by mouth every 6 (six) hours as needed.    [name] levonorgestrel (MIRENA) 20 MCG/24HR IUD 1 each by Intrauterine route once.    . Magnesium 250 MG TABS Take 1 tablet by mouth daily.    . metFORMIN (GLUCOPHAGE) 500 MG tablet Take 1 tablet (500 mg total) by mouth daily with breakfast. 90 tablet 0  . montelukast (SINGULAIR) 10 MG tablet Take 10 mg by mouth at bedtime.    . Multiple Vitamins-Minerals (HAIR SKIN & NAILS ADVANCED PO) Take 1 tablet by mouth daily.    . Omega-3 Fatty Acids (OMEGA-3 FISH OIL) 1200 MG CAPS Take 2 capsules by mouth daily.    . Oral Electrolytes (REVITAL JELL CUPS PO) Take 1 capsule by mouth daily.    . phenazopyridine (PYRIDIUM) 100 MG tablet Take 1 tablet (100 mg total) by mouth 3 (three) times daily as needed for pain. 10 tablet 0  . cetirizine (ZYRTEC) 10 MG tablet Take 10 mg by mouth daily.     No current facility-administered medications on file prior to visit.     PAST MEDICAL HISTORY: Past Medical History:  Diagnosis Date  . Allergy   . Asthma   . Breast lump   . Constipation   . Dry skin   . Fatigue   . GERD (gastroesophageal reflux disease)    pregnancy related-zantac  . HTN (hypertension)   . Hyperlipidemia   . Infertility, female   . Joint pain   . Lactose intolerance   . Missed abortion    6weeks 3days  . Muscle pain   . Obesity   . Palpitations   . PCOS (polycystic ovarian syndrome)   . Seasonal allergies   . Skin rash   . Stress   . Vitamin D deficiency     PAST SURGICAL HISTORY: Past Surgical History:  Procedure Laterality Date  . BREAST BIOPSY    . CESAREAN SECTION    . DILATION AND EVACUATION  05/09/2012   Procedure: DILATATION AND EVACUATION;  Surgeon: Marvene Staff, MD;  Location: [facility];  Service:  Gynecology;  Laterality: N/A;  . EYE SURGERY    . LASIK    . [place]    . WISDOM TOOTH EXTRACTION  1994    SOCIAL HISTORY: Social History   Tobacco Use  . Smoking status: Never Smoker  . Smokeless tobacco: Never Used  Substance Use Topics  . Alcohol use: No    Alcohol/week: 0.0 standard drinks  . Drug use: No    FAMILY HISTORY: Family History  Problem Relation Age of Onset  . Hyperlipidemia Mother   . Hyperlipidemia Father   . Hypertension Father   . Anxiety disorder Father   . Hyperlipidemia Maternal Grandmother   . Hypertension Maternal Grandmother   . Heart disease Maternal Grandmother   . Heart disease Maternal  Grandfather   . Diabetes Maternal Grandfather   . Stroke Paternal Grandmother   . Mental illness Paternal Grandmother   . Heart disease Paternal Grandmother    ROS: Review of Systems  Constitutional: Positive for malaise/fatigue.  Gastrointestinal: Negative for nausea and vomiting.  Musculoskeletal:       Negative for muscle weakness.   PHYSICAL EXAM: Blood pressure 123/80, pulse 82, temperature 98.5 F (36.9 C), temperature source Oral, height 5\' 5"  (1.651 m), weight 190 lb (86.2 kg), SpO2 98 %. Body mass index is 31.62 kg/m. Physical Exam Vitals signs reviewed.  Constitutional:      Appearance: Normal appearance. She is obese.  Cardiovascular:     Rate and Rhythm: Normal rate.     Pulses: Normal pulses.  Pulmonary:     Effort: Pulmonary effort is normal.     Breath sounds: Normal breath sounds.  Musculoskeletal: Normal range of motion.  Skin:    General: Skin is warm and dry.  Neurological:     Mental Status: She is alert and oriented to person, place, and time.  Psychiatric:        Behavior: Behavior normal.   RECENT LABS AND TESTS: BMET    Component Value Date/Time   NA 139 11/27/2017 1203   K 4.3 11/27/2017 1203   CL 99 11/27/2017 1203   CO2 24 11/27/2017 1203   GLUCOSE 84 11/27/2017 1203   GLUCOSE 91 04/06/2015 1715   BUN 11  11/27/2017 1203   CREATININE 0.89 11/27/2017 1203   CALCIUM 9.7 11/27/2017 1203   GFRNONAA 79 11/27/2017 1203   GFRAA 91 11/27/2017 1203   Lab Results  Component Value Date   HGBA1C 5.4 01/13/2019   HGBA1C 5.3 07/16/2018   HGBA1C 5.4 03/12/2018   HGBA1C 5.7 (H) 11/27/2017   Lab Results  Component Value Date   INSULIN 8.2 01/13/2019   INSULIN 8.2 07/16/2018   INSULIN 6.7 03/12/2018   INSULIN 12.2 11/27/2017   CBC    Component Value Date/Time   WBC 8.0 11/27/2017 1203   WBC 10.4 04/06/2015 1715   RBC 4.54 11/27/2017 1203   RBC 4.58 04/06/2015 1715   HGB 13.6 11/27/2017 1203   HCT 38.8 11/27/2017 1203   PLT 343 04/06/2015 1715   MCV 86 11/27/2017 1203   MCH 30.0 11/27/2017 1203   MCH 30.3 04/06/2015 1715   MCHC 35.1 11/27/2017 1203   MCHC 33.7 04/06/2015 1715   RDW 14.5 11/27/2017 1203   LYMPHSABS 2.2 11/27/2017 1203   MONOABS 0.6 04/06/2015 1715   EOSABS 0.2 11/27/2017 1203   BASOSABS 0.0 11/27/2017 1203   Iron/TIBC/Ferritin/ %Sat No results found for: IRON, TIBC, FERRITIN, IRONPCTSAT Lipid Panel     Component Value Date/Time   CHOL 196 01/13/2019 1028   TRIG 71 01/13/2019 1028   HDL 43 01/13/2019 1028   LDLCALC 139 (H) 01/13/2019 1028   Hepatic Function Panel     Component Value Date/Time   PROT 7.8 11/27/2017 1203   ALBUMIN 4.8 11/27/2017 1203   AST 20 11/27/2017 1203   ALT 20 11/27/2017 1203   ALKPHOS 42 11/27/2017 1203   BILITOT 0.4 11/27/2017 1203      Component Value Date/Time   TSH 0.667 11/27/2017 1203   TSH 0.406  05/26/2009 1040   Results for ABIGAHIL, SINHA (MRN [id number]) as of 05/13/2019 08:15  Ref. Range 01/13/2019 10:28  Vitamin D, 25-Hydroxy Latest Ref Range: 30.0 - 100.0 ng/mL 60.5   OBESITY BEHAVIORAL INTERVENTION VISIT  Today's visit was #  31  Starting weight: 199 lbs Starting date: 11/27/2017 Today's weight: 190 lbs  Today's date: 05/12/2019 Total lbs lost to date: 9    05/12/2019  Height 5\' 5"  (1.651 m)  Weight 190 lb  (86.2 kg)  BMI (Calculated) 31.62  BLOOD PRESSURE - SYSTOLIC [id number]  BLOOD PRESSURE - DIASTOLIC 80   Body Fat % [id number] %  Total Body Water (lbs) 76 lbs   ASK: We discussed the diagnosis of obesity with Robin Knapp today and Robin Knapp agreed to give [name] permission to discuss obesity behavioral modification therapy today.  ASSESS: Robin Knapp has the diagnosis of obesity and her BMI today is 31.6. Robin Knapp is in the action stage of change.   ADVISE: Robin Knapp was educated on the multiple health risks of obesity as well as the benefit of weight loss to improve her health. She was advised of the need for long term treatment and the importance of lifestyle modifications to improve her current health and to decrease her risk of future health problems.  AGREE: Multiple dietary modification options and treatment options were discussed and  Robin Knapp agreed to follow the recommendations documented in the above note.  ARRANGE: Robin Knapp was educated on the importance of frequent visits to treat obesity as outlined per CMS and USPSTF guidelines and agreed to schedule her next follow up appointment today.  I, Michaelene Song, am acting as transcriptionist for Ilene Qua, MD  I have reviewed the above documentation for accuracy and completeness, and I agree with the above. - Ilene Qua, MD

## 2019-05-26 ENCOUNTER — Ambulatory Visit (INDEPENDENT_AMBULATORY_CARE_PROVIDER_SITE_OTHER): Payer: No Typology Code available for payment source | Admitting: Family Medicine

## 2019-05-26 ENCOUNTER — Encounter (INDEPENDENT_AMBULATORY_CARE_PROVIDER_SITE_OTHER): Payer: Self-pay | Admitting: Family Medicine

## 2019-05-26 ENCOUNTER — Other Ambulatory Visit: Payer: Self-pay

## 2019-05-26 DIAGNOSIS — Z6830 Body mass index (BMI) 30.0-30.9, adult: Secondary | ICD-10-CM

## 2019-05-26 DIAGNOSIS — E559 Vitamin D deficiency, unspecified: Secondary | ICD-10-CM

## 2019-05-26 DIAGNOSIS — E669 Obesity, unspecified: Secondary | ICD-10-CM

## 2019-05-26 DIAGNOSIS — E7849 Other hyperlipidemia: Secondary | ICD-10-CM

## 2019-05-28 NOTE — Progress Notes (Signed)
Office: [phone]  /  Fax: [phone] TeleHealth Visit:  Robin Knapp has verbally consented to this TeleHealth visit today. The patient is located at home, the provider is located at the [facility] office. The participants in this visit include the listed provider and patient and any and all parties involved. The visit was conducted today via FaceTime.  HPI:   Chief Complaint: OBESITY Robin Knapp is here to discuss her progress with her obesity treatment plan. She is on the keep a food journal with 1350 to 1450 calories and 85 to 90 grams of protein daily plan and is following her eating plan approximately 50 % of the time. She states she is walking 75 to 90 minutes 2 times per week. Robin Knapp has a weight of 189 pounds today. She has been trying to be more mindful of getting her calories and protein in. She is still struggling with getting in all of her calories, but she is often still getting her protein in. Work wise, patient is bringing her food with her, to avoid the temptations of ordering out. We were unable to weigh the patient today for this TeleHealth visit. She feels as if she has lost weight since her last visit. She has lost 9 lbs since starting treatment with [name].  Hyperlipidemia Robin Knapp has hyperlipidemia and her last LDL was at 139. Robin Knapp is not on statin. She has been trying to improve her cholesterol levels with intensive lifestyle modification including a low saturated fat diet, exercise and weight loss. She denies any chest pain or myalgias.  Vitamin D deficiency Robin Knapp has a diagnosis of vitamin D deficiency. She is currently taking OTC vit D and she denies nausea, vomiting or muscle weakness. Her last vitamin D level was at 60.5.  ASSESSMENT AND PLAN:  Other hyperlipidemia  Vitamin D deficiency  Class 1 obesity with serious comorbidity and body mass index (BMI) of 30.0 to 30.9 in adult, unspecified obesity type  PLAN:  Hyperlipidemia Robin Knapp  was informed of the American Heart Association Guidelines emphasizing intensive lifestyle modifications as the first line treatment for hyperlipidemia. We discussed many lifestyle modifications today in depth, and Robin Knapp will continue to work on decreasing saturated fats such as fatty red meat, butter and many fried foods. She will also increase vegetables and lean protein in her diet and continue to work on exercise and weight loss efforts. We will repeat labs at the next appointment.  Vitamin D Deficiency Robin Knapp was informed that low vitamin D levels contributes to fatigue and are associated with obesity, breast, and colon cancer. She will continue to take OTC vitamin D. We will repeat labs at the next appointment and she will follow up for routine testing of vitamin D, at least 2-3 times per year. She was informed of the risk of over-replacement of vitamin D and agrees to not increase her dose unless she discusses this with [name] first.  Obesity Robin Knapp is currently in the action stage of change. As such, her goal is to continue with weight loss efforts She has agreed to keep a food journal with 1350 to 1450 calories and 90+ grams of protein daily Robin Knapp has been instructed to work up to a goal of 150 minutes of combined cardio and strengthening exercise per week for weight loss and overall health benefits. We discussed the following Behavioral Modification Strategies today: planning for success, keeping healthy foods in the home, increasing lean protein intake, increasing vegetables and work on meal planning and easy cooking  plans  Robin Knapp has agreed to follow up with our clinic in 2 weeks. She was informed of the importance of frequent follow up visits to maximize her success with intensive lifestyle modifications for her multiple health conditions.  ALLERGIES: Allergies  Allergen Reactions  . Sulfa Antibiotics Hives    itching  . Sunscreens     MEDICATIONS: Current Outpatient Medications  on File Prior to Visit  Medication Sig Dispense Refill  . acetaminophen (TYLENOL 8 HOUR ARTHRITIS PAIN) 650 MG CR tablet Take 650 mg by mouth every 8 (eight) hours as needed for pain.    [name] albuterol (PROVENTIL HFA;VENTOLIN HFA) 108 (90 Base) MCG/ACT inhaler Inhale 2 puffs into the lungs every 6 (six) hours as needed for wheezing or shortness of breath.    . cetirizine (ZYRTEC) 10 MG tablet Take 10 mg by mouth daily.    . Cholecalciferol ([employer] VITAMIN D3) 10000 units CAPS Take by mouth.    . fexofenadine-pseudoephedrine (ALLEGRA-D) 60-120 MG 12 hr tablet Take 1 tablet by mouth 2 (two) times daily.    [name] gabapentin (NEURONTIN) 100 MG capsule Take 3 capsules (300 mg total) by mouth at bedtime. 90 capsule 11  . ibuprofen (ADVIL,MOTRIN) 600 MG tablet Take 600 mg by mouth every 6 (six) hours as needed.    [name] levonorgestrel (MIRENA) 20 MCG/24HR IUD 1 each by Intrauterine route once.    . Magnesium 250 MG TABS Take 1 tablet by mouth daily.    . metFORMIN (GLUCOPHAGE) 500 MG tablet Take 1 tablet (500 mg total) by mouth daily with breakfast. 90 tablet 0  . montelukast (SINGULAIR) 10 MG tablet Take 10 mg by mouth at bedtime.    . Multiple Vitamins-Minerals (HAIR SKIN & NAILS ADVANCED PO) Take 1 tablet by mouth daily.    . Omega-3 Fatty Acids (OMEGA-3 FISH OIL) 1200 MG CAPS Take 2 capsules by mouth daily.    . Oral Electrolytes (REVITAL JELL CUPS PO) Take 1 capsule by mouth daily.    . phenazopyridine (PYRIDIUM) 100 MG tablet Take 1 tablet (100 mg total) by mouth 3 (three) times daily as needed for pain. (Patient not taking: Reported on 05/26/2019) 10 tablet 0   No current facility-administered medications on file prior to visit.     PAST MEDICAL HISTORY: Past Medical History:  Diagnosis Date  . Allergy   . Asthma   . Breast lump   . Constipation   . Dry skin   . Fatigue   . GERD (gastroesophageal reflux disease)    pregnancy related-zantac  . HTN (hypertension)   . Hyperlipidemia   . Infertility,  female   . Joint pain   . Lactose intolerance   . Missed abortion    6weeks 3days  . Muscle pain   . Obesity   . Palpitations   . PCOS (polycystic ovarian syndrome)   . Seasonal allergies   . Skin rash   . Stress   . Vitamin D deficiency     PAST SURGICAL HISTORY: Past Surgical History:  Procedure Laterality Date  . BREAST BIOPSY    . CESAREAN SECTION    . DILATION AND EVACUATION  05/09/2012   Procedure: DILATATION AND EVACUATION;  Surgeon: Marvene Staff, MD;  Location: [facility];  Service: Gynecology;  Laterality: N/A;  . EYE SURGERY    . LASIK    . [place]    . WISDOM TOOTH EXTRACTION  1994    SOCIAL HISTORY: Social History   Tobacco Use  . Smoking status:  Never Smoker  . Smokeless tobacco: Never Used  Substance Use Topics  . Alcohol use: No    Alcohol/week: 0.0 standard drinks  . Drug use: No    FAMILY HISTORY: Family History  Problem Relation Age of Onset  . Hyperlipidemia Mother   . Hyperlipidemia Father   . Hypertension Father   . Anxiety disorder Father   . Hyperlipidemia Maternal Grandmother   . Hypertension Maternal Grandmother   . Heart disease Maternal Grandmother   . Heart disease Maternal Grandfather   . Diabetes Maternal Grandfather   . Stroke Paternal Grandmother   . Mental illness Paternal Grandmother   . Heart disease Paternal Grandmother     ROS: Review of Systems  Constitutional: Positive for weight loss.  Cardiovascular: Negative for chest pain.  Gastrointestinal: Negative for nausea and vomiting.  Musculoskeletal: Negative for myalgias.       Negative for muscle weakness    PHYSICAL EXAM: Pt in no acute distress  RECENT LABS AND TESTS: BMET    Component Value Date/Time   NA 139 11/27/2017 1203   K 4.3 11/27/2017 1203   CL 99 11/27/2017 1203   CO2 24 11/27/2017 1203   GLUCOSE 84 11/27/2017 1203   GLUCOSE 91 04/06/2015 1715   BUN 11 11/27/2017 1203   CREATININE 0.89 11/27/2017 1203   CALCIUM 9.7 11/27/2017 1203    GFRNONAA 79 11/27/2017 1203   GFRAA 91 11/27/2017 1203   Lab Results  Component Value Date   HGBA1C 5.4 01/13/2019   HGBA1C 5.3 07/16/2018   HGBA1C 5.4 03/12/2018   HGBA1C 5.7 (H) 11/27/2017   Lab Results  Component Value Date   INSULIN 8.2 01/13/2019   INSULIN 8.2 07/16/2018   INSULIN 6.7 03/12/2018   INSULIN 12.2 11/27/2017   CBC    Component Value Date/Time   WBC 8.0 11/27/2017 1203   WBC 10.4 04/06/2015 1715   RBC 4.54 11/27/2017 1203   RBC 4.58 04/06/2015 1715   HGB 13.6 11/27/2017 1203   HCT 38.8 11/27/2017 1203   PLT 343 04/06/2015 1715   MCV 86 11/27/2017 1203   MCH 30.0 11/27/2017 1203   MCH 30.3 04/06/2015 1715   MCHC 35.1 11/27/2017 1203   MCHC 33.7 04/06/2015 1715   RDW 14.5 11/27/2017 1203   LYMPHSABS 2.2 11/27/2017 1203   MONOABS 0.6 04/06/2015 1715   EOSABS 0.2 11/27/2017 1203   BASOSABS 0.0 11/27/2017 1203   Iron/TIBC/Ferritin/ %Sat No results found for: IRON, TIBC, FERRITIN, IRONPCTSAT Lipid Panel     Component Value Date/Time   CHOL 196 01/13/2019 1028   TRIG 71 01/13/2019 1028   HDL 43 01/13/2019 1028   LDLCALC 139 (H) 01/13/2019 1028   Hepatic Function Panel     Component Value Date/Time   PROT 7.8 11/27/2017 1203   ALBUMIN 4.8 11/27/2017 1203   AST 20 11/27/2017 1203   ALT 20 11/27/2017 1203   ALKPHOS 42 11/27/2017 1203   BILITOT 0.4 11/27/2017 1203      Component Value Date/Time   TSH 0.667 11/27/2017 1203   TSH 0.406  05/26/2009 1040     Ref. Range 01/13/2019 10:28  Vitamin D, 25-Hydroxy Latest Ref Range: 30.0 - 100.0 ng/mL 60.5    I, Doreene Nest, am acting as Location manager for Eber Jones, MD  I have reviewed the above documentation for accuracy and completeness, and I agree with the above. - Ilene Qua, MD

## 2019-06-10 ENCOUNTER — Other Ambulatory Visit: Payer: Self-pay

## 2019-06-10 ENCOUNTER — Encounter (INDEPENDENT_AMBULATORY_CARE_PROVIDER_SITE_OTHER): Payer: Self-pay | Admitting: Family Medicine

## 2019-06-10 ENCOUNTER — Ambulatory Visit (INDEPENDENT_AMBULATORY_CARE_PROVIDER_SITE_OTHER): Payer: No Typology Code available for payment source | Admitting: Family Medicine

## 2019-06-10 VITALS — BP 128/82 | HR 108 | Temp 98.1°F | Ht 65.0 in | Wt 187.0 lb

## 2019-06-10 DIAGNOSIS — E669 Obesity, unspecified: Secondary | ICD-10-CM

## 2019-06-10 DIAGNOSIS — Z9189 Other specified personal risk factors, not elsewhere classified: Secondary | ICD-10-CM | POA: Diagnosis not present

## 2019-06-10 DIAGNOSIS — E7849 Other hyperlipidemia: Secondary | ICD-10-CM | POA: Diagnosis not present

## 2019-06-10 DIAGNOSIS — R7303 Prediabetes: Secondary | ICD-10-CM

## 2019-06-10 DIAGNOSIS — Z6830 Body mass index (BMI) 30.0-30.9, adult: Secondary | ICD-10-CM

## 2019-06-10 MED ORDER — METFORMIN HCL 500 MG PO TABS: 500.0000 mg | ORAL_TABLET | Freq: Every day | ORAL | 0 refills | Status: DC

## 2019-06-15 NOTE — Progress Notes (Signed)
Office: [phone]  /  Fax: [phone]   HPI:   Chief Complaint: OBESITY Robin Knapp is here to discuss her progress with her obesity treatment plan. She is on the keep a food journal with 1350-1450 calories and 90+ grams of protein daily and is following her eating plan approximately 50 % of the time. She states she is walking for 90-120 minutes 2 times per week. [name] voices the last few weeks have been busy and she has been working, and going back and forth between the hospital and clinic. She has been trying to be mindful of calories and getting protein in. She is planning to do women's only 5K.  Her weight is 187 lb (84.8 kg) today and has had a weight loss of 3 pounds over a period of 4 weeks since her last visit. She has lost 12 lbs since starting treatment with [name].  Pre-Diabetes Robin Knapp has a diagnosis of pre-diabetes based on her elevated Hgb A1c and was informed this puts her at greater risk of developing diabetes. She notes better carbohydrate control with metformin, and denies GI side effects. She continues to work on diet and exercise to decrease risk of diabetes.   At risk for diabetes Robin Knapp is at higher than average risk for developing diabetes due to her obesity and pre-diabetes. She currently denies polyuria or polydipsia.  Allergic Rhinitis Robin Knapp has a diagnosis of allergic rhinitis. She is on 12 hour Allegra D. She notes her symptoms are better controlled.  ASSESSMENT AND PLAN:  Other hyperlipidemia  Prediabetes - Plan: metFORMIN (GLUCOPHAGE) 500 MG tablet  At risk for diabetes mellitus  Class 1 obesity with serious comorbidity and body mass index (BMI) of 30.0 to 30.9 in adult, unspecified obesity type  PLAN:  Pre-Diabetes Robin Knapp will continue to work on weight loss, exercise, and decreasing simple carbohydrates in her diet to help decrease the risk of diabetes. We dicussed metformin including benefits and risks. She was informed that eating too many  simple carbohydrates or too many calories at one sitting increases the likelihood of GI side effects. Robin Knapp agrees to continue taking metformin 500 mg PO daily #30 and we will refill for 1 month. Robin Knapp agrees to follow up with our clinic in 2 weeks as directed to monitor her progress.  Diabetes risk counseling Robin Knapp was given extended (15 minutes) diabetes prevention counseling today. She is 48 y.o. female and has risk factors for diabetes including obesity and pre-diabetes. We discussed intensive lifestyle modifications today with an emphasis on weight loss as well as increasing exercise and decreasing simple carbohydrates in her diet.  Allergic Rhinitis [name] agrees to continue taking Allegra D, and she agrees to follow up with our clinic in 2 weeks.  Obesity Robin Knapp is currently in the action stage of change. As such, her goal is to continue with weight loss efforts She has agreed to keep a food journal with 1350-1450 calories and 85-90+ grams of protein daily Robin Knapp has been instructed to work up to a goal of 150 minutes of combined cardio and strengthening exercise per week for weight loss and overall health benefits. We discussed the following Behavioral Modification Strategies today: increasing lean protein intake, increasing vegetables and work on meal planning and easy cooking plans, keeping healthy foods in the home, and planning for success   Robin Knapp has agreed to follow up with our clinic in 2 weeks. She was informed of the importance of frequent follow up visits to maximize her success with intensive lifestyle modifications for  her multiple health conditions.  ALLERGIES: Allergies  Allergen Reactions  . Sulfa Antibiotics Hives    itching  . Sunscreens     MEDICATIONS: Current Outpatient Medications on File Prior to Visit  Medication Sig Dispense Refill  . acetaminophen (TYLENOL 8 HOUR ARTHRITIS PAIN) 650 MG CR tablet Take 650 mg by mouth every 8 (eight) hours as  needed for pain.    [name] albuterol (PROVENTIL HFA;VENTOLIN HFA) 108 (90 Base) MCG/ACT inhaler Inhale 2 puffs into the lungs every 6 (six) hours as needed for wheezing or shortness of breath.    . Cholecalciferol ([employer] VITAMIN D3) 10000 units CAPS Take by mouth.    . fexofenadine-pseudoephedrine (ALLEGRA-D) 60-120 MG 12 hr tablet Take 1 tablet by mouth 2 (two) times daily.    [name] ibuprofen (ADVIL,MOTRIN) 600 MG tablet Take 600 mg by mouth every 6 (six) hours as needed.    [name] levonorgestrel (MIRENA) 20 MCG/24HR IUD 1 each by Intrauterine route once.    . Magnesium 250 MG TABS Take 1 tablet by mouth daily.    . montelukast (SINGULAIR) 10 MG tablet Take 10 mg by mouth at bedtime.    . Multiple Vitamins-Minerals (HAIR SKIN & NAILS ADVANCED PO) Take 1 tablet by mouth daily.    . Oral Electrolytes (REVITAL JELL CUPS PO) Take 1 capsule by mouth daily.     No current facility-administered medications on file prior to visit.     PAST MEDICAL HISTORY: Past Medical History:  Diagnosis Date  . Allergy   . Asthma   . Breast lump   . Constipation   . Dry skin   . Fatigue   . GERD (gastroesophageal reflux disease)    pregnancy related-zantac  . HTN (hypertension)   . Hyperlipidemia   . Infertility, female   . Joint pain   . Lactose intolerance   . Missed abortion    6weeks 3days  . Muscle pain   . Obesity   . Palpitations   . PCOS (polycystic ovarian syndrome)   . Seasonal allergies   . Skin rash   . Stress   . Vitamin D deficiency     PAST SURGICAL HISTORY: Past Surgical History:  Procedure Laterality Date  . BREAST BIOPSY    . CESAREAN SECTION    . DILATION AND EVACUATION  05/09/2012   Procedure: DILATATION AND EVACUATION;  Surgeon: Marvene Staff, MD;  Location: [facility];  Service: Gynecology;  Laterality: N/A;  . EYE SURGERY    . LASIK    . [place]    . WISDOM TOOTH EXTRACTION  1994    SOCIAL HISTORY: Social History   Tobacco Use  . Smoking status: Never Smoker  . Smokeless  tobacco: Never Used  Substance Use Topics  . Alcohol use: No    Alcohol/week: 0.0 standard drinks  . Drug use: No    FAMILY HISTORY: Family History  Problem Relation Age of Onset  . Hyperlipidemia Mother   . Hyperlipidemia Father   . Hypertension Father   . Anxiety disorder Father   . Hyperlipidemia Maternal Grandmother   . Hypertension Maternal Grandmother   . Heart disease Maternal Grandmother   . Heart disease Maternal Grandfather   . Diabetes Maternal Grandfather   . Stroke Paternal Grandmother   . Mental illness Paternal Grandmother   . Heart disease Paternal Grandmother     ROS: Review of Systems  Constitutional: Positive for weight loss.  Genitourinary: Negative for frequency.  Endo/Heme/Allergies: Negative for polydipsia.  PHYSICAL EXAM: Blood pressure 128/82, pulse (!) 108, temperature 98.1 F (36.7 C), temperature source Oral, height 5\' 5"  (1.651 m), weight 187 lb (84.8 kg), SpO2 99 %. Body mass index is 31.12 kg/m. Physical Exam Vitals signs reviewed.  Constitutional:      Appearance: Normal appearance. She is obese.  Cardiovascular:     Rate and Rhythm: Normal rate.     Pulses: Normal pulses.  Pulmonary:     Effort: Pulmonary effort is normal.     Breath sounds: Normal breath sounds.  Musculoskeletal: Normal range of motion.  Skin:    General: Skin is warm and dry.  Neurological:     Mental Status: She is alert and oriented to person, place, and time.  Psychiatric:        Mood and Affect: Mood normal.        Behavior: Behavior normal.     RECENT LABS AND TESTS: BMET    Component Value Date/Time   NA 139 11/27/2017 1203   K 4.3 11/27/2017 1203   CL 99 11/27/2017 1203   CO2 24 11/27/2017 1203   GLUCOSE 84 11/27/2017 1203   GLUCOSE 91 04/06/2015 1715   BUN 11 11/27/2017 1203   CREATININE 0.89 11/27/2017 1203   CALCIUM 9.7 11/27/2017 1203   GFRNONAA 79 11/27/2017 1203   GFRAA 91 11/27/2017 1203   Lab Results  Component Value Date    HGBA1C 5.4 01/13/2019   HGBA1C 5.3 07/16/2018   HGBA1C 5.4 03/12/2018   HGBA1C 5.7 (H) 11/27/2017   Lab Results  Component Value Date   INSULIN 8.2 01/13/2019   INSULIN 8.2 07/16/2018   INSULIN 6.7 03/12/2018   INSULIN 12.2 11/27/2017   CBC    Component Value Date/Time   WBC 8.0 11/27/2017 1203   WBC 10.4 04/06/2015 1715   RBC 4.54 11/27/2017 1203   RBC 4.58 04/06/2015 1715   HGB 13.6 11/27/2017 1203   HCT 38.8 11/27/2017 1203   PLT 343 04/06/2015 1715   MCV 86 11/27/2017 1203   MCH 30.0 11/27/2017 1203   MCH 30.3 04/06/2015 1715   MCHC 35.1 11/27/2017 1203   MCHC 33.7 04/06/2015 1715   RDW 14.5 11/27/2017 1203   LYMPHSABS 2.2 11/27/2017 1203   MONOABS 0.6 04/06/2015 1715   EOSABS 0.2 11/27/2017 1203   BASOSABS 0.0 11/27/2017 1203   Iron/TIBC/Ferritin/ %Sat No results found for: IRON, TIBC, FERRITIN, IRONPCTSAT Lipid Panel     Component Value Date/Time   CHOL 196 01/13/2019 1028   TRIG 71 01/13/2019 1028   HDL 43 01/13/2019 1028   LDLCALC 139 (H) 01/13/2019 1028   Hepatic Function Panel     Component Value Date/Time   PROT 7.8 11/27/2017 1203   ALBUMIN 4.8 11/27/2017 1203   AST 20 11/27/2017 1203   ALT 20 11/27/2017 1203   ALKPHOS 42 11/27/2017 1203   BILITOT 0.4 11/27/2017 1203      Component Value Date/Time   TSH 0.667 11/27/2017 1203   TSH 0.406  05/26/2009 1040      OBESITY BEHAVIORAL INTERVENTION VISIT  Today's visit was # 33   Starting weight: 199 lbs Starting date: 11/27/17 Today's weight : 187 lbs Today's date: 06/10/2019 Total lbs lost to date: 12    ASK: We discussed the diagnosis of obesity with Mally Octaviano Glow today and Alverda agreed to give [name] permission to discuss obesity behavioral modification therapy today.  ASSESS: Tekeyah has the diagnosis of obesity and her BMI today is 31.12 Taunya is in the  action stage of change   ADVISE: Jaidon was educated on the multiple health risks of obesity as well as the benefit of  weight loss to improve her health. She was advised of the need for long term treatment and the importance of lifestyle modifications to improve her current health and to decrease her risk of future health problems.  AGREE: Multiple dietary modification options and treatment options were discussed and  Hartlee agreed to follow the recommendations documented in the above note.  ARRANGE: Clarisa was educated on the importance of frequent visits to treat obesity as outlined per CMS and USPSTF guidelines and agreed to schedule her next follow up appointment today.  I, Trixie Dredge, am acting as transcriptionist for Ilene Qua, MD  I have reviewed the above documentation for accuracy and completeness, and I agree with the above. - Ilene Qua, MD

## 2019-06-24 ENCOUNTER — Encounter (INDEPENDENT_AMBULATORY_CARE_PROVIDER_SITE_OTHER): Payer: Self-pay | Admitting: Family Medicine

## 2019-06-24 ENCOUNTER — Ambulatory Visit (INDEPENDENT_AMBULATORY_CARE_PROVIDER_SITE_OTHER): Payer: No Typology Code available for payment source | Admitting: Family Medicine

## 2019-06-24 ENCOUNTER — Other Ambulatory Visit: Payer: Self-pay

## 2019-06-24 VITALS — BP 145/81 | HR 81 | Temp 97.6°F | Ht 65.0 in | Wt 187.0 lb

## 2019-06-24 DIAGNOSIS — E7849 Other hyperlipidemia: Secondary | ICD-10-CM

## 2019-06-24 DIAGNOSIS — Z6830 Body mass index (BMI) 30.0-30.9, adult: Secondary | ICD-10-CM | POA: Diagnosis not present

## 2019-06-24 DIAGNOSIS — E669 Obesity, unspecified: Secondary | ICD-10-CM

## 2019-06-24 DIAGNOSIS — J3089 Other allergic rhinitis: Secondary | ICD-10-CM

## 2019-06-25 NOTE — Progress Notes (Signed)
Office: [phone]  /  Fax: [phone]   HPI:   Chief Complaint: OBESITY Robin Knapp is here to discuss her progress with her obesity treatment plan. She is on the keep a food journal with 1350-1450 calories and 85-90+ grams of protein daily and is following her eating plan approximately 85-90 % of the time. She states she is doing zumba for 30-45 minutes 3 times per week. Robin Knapp has started doing zumba 3 times per week. Journaling wise, she hasn't been writing it down just trying to keep on track mentally. She notes work is busy but she is still needing to work on not skipping meals at work.  Her weight is 187 lb (84.8 kg) today and has not lost weight since her last visit. She has lost 12 lbs since starting treatment with [name].  Allergic Rhinitis [name] is on Allegra D daily. She notes her symptoms have been terrible on Allegra daily.  Hyperlipidemia Robin Knapp has hyperlipidemia and has been trying to improve her cholesterol levels with intensive lifestyle modification including a low saturated fat diet, exercise and weight loss. Last LDL was of 139 in April 2020. She is not on statin and denies any chest pain, claudication or myalgias.  ASSESSMENT AND PLAN:  Allergic rhinitis due to other allergic trigger, unspecified seasonality  Other hyperlipidemia  Class 1 obesity with serious comorbidity and body mass index (BMI) of 30.0 to 30.9 in adult, unspecified obesity type  PLAN:  Allergic Rhinitis Robin Knapp agrees to start Flonase 2 sprays into each nostril daily, she is to get OTC. Robin Knapp agrees to follow up with our clinic in 2 to 3 weeks.  Hyperlipidemia Robin Knapp was informed of the American Heart Association Guidelines emphasizing intensive lifestyle modifications as the first line treatment for hyperlipidemia. We discussed many lifestyle modifications today in depth, and Robin Knapp will continue to work on decreasing saturated fats such as fatty red meat, butter and many fried foods.  She will also increase vegetables and lean protein in her diet and continue to work on exercise and weight loss efforts. We will repeat labs in early November.  I spent > than 50% of the 15 minute visit on counseling as documented in the note.  Obesity Monai is currently in the action stage of change. As such, her goal is to continue with weight loss efforts She has agreed to keep a food journal with 1350-1450 calories and 85+ grams of protein daily Robin Knapp has been instructed to work up to a goal of 150 minutes of combined cardio and strengthening exercise per week for weight loss and overall health benefits. We discussed the following Behavioral Modification Strategies today: increasing lean protein intake, increasing vegetables and work on meal planning and easy cooking plans, keeping healthy foods in the home, and planning for success   Robin Knapp has agreed to follow up with our clinic in 2 to 3 weeks. She was informed of the importance of frequent follow up visits to maximize her success with intensive lifestyle modifications for her multiple health conditions.  ALLERGIES: Allergies  Allergen Reactions  . Sulfa Antibiotics Hives    itching  . Sunscreens     MEDICATIONS: Current Outpatient Medications on File Prior to Visit  Medication Sig Dispense Refill  . acetaminophen (TYLENOL 8 HOUR ARTHRITIS PAIN) 650 MG CR tablet Take 650 mg by mouth every 8 (eight) hours as needed for pain.    Robin Knapp albuterol (PROVENTIL HFA;VENTOLIN HFA) 108 (90 Base) MCG/ACT inhaler Inhale 2 puffs into the lungs every 6 (six)  hours as needed for wheezing or shortness of breath.    . Cholecalciferol ([employer] VITAMIN D3) 10000 units CAPS Take by mouth.    . fexofenadine-pseudoephedrine (ALLEGRA-D) 60-120 MG 12 hr tablet Take 1 tablet by mouth 2 (two) times daily.    Robin Knapp ibuprofen (ADVIL,MOTRIN) 600 MG tablet Take 600 mg by mouth every 6 (six) hours as needed.    Robin Knapp levonorgestrel (MIRENA) 20 MCG/24HR IUD 1 each by  Intrauterine route once.    . Magnesium 250 MG TABS Take 1 tablet by mouth daily.    . metFORMIN (GLUCOPHAGE) 500 MG tablet Take 1 tablet (500 mg total) by mouth daily with breakfast. 90 tablet 0  . Multiple Vitamins-Minerals (HAIR SKIN & NAILS ADVANCED PO) Take 1 tablet by mouth daily.    . montelukast (SINGULAIR) 10 MG tablet Take 10 mg by mouth at bedtime.     No current facility-administered medications on file prior to visit.     PAST MEDICAL HISTORY: Past Medical History:  Diagnosis Date  . Allergy   . Asthma   . Breast lump   . Constipation   . Dry skin   . Fatigue   . GERD (gastroesophageal reflux disease)    pregnancy related-zantac  . HTN (hypertension)   . Hyperlipidemia   . Infertility, female   . Joint pain   . Lactose intolerance   . Missed abortion    6weeks 3days  . Muscle pain   . Obesity   . Palpitations   . PCOS (polycystic ovarian syndrome)   . Seasonal allergies   . Skin rash   . Stress   . Vitamin D deficiency     PAST SURGICAL HISTORY: Past Surgical History:  Procedure Laterality Date  . BREAST BIOPSY    . CESAREAN SECTION    . DILATION AND EVACUATION  05/09/2012   Procedure: DILATATION AND EVACUATION;  Surgeon: Marvene Staff, MD;  Location: [facility];  Service: Gynecology;  Laterality: N/A;  . EYE SURGERY    . LASIK    . [place]    . WISDOM TOOTH EXTRACTION  1994    SOCIAL HISTORY: Social History   Tobacco Use  . Smoking status: Never Smoker  . Smokeless tobacco: Never Used  Substance Use Topics  . Alcohol use: No    Alcohol/week: 0.0 standard drinks  . Drug use: No    FAMILY HISTORY: Family History  Problem Relation Age of Onset  . Hyperlipidemia Mother   . Hyperlipidemia Father   . Hypertension Father   . Anxiety disorder Father   . Hyperlipidemia Maternal Grandmother   . Hypertension Maternal Grandmother   . Heart disease Maternal Grandmother   . Heart disease Maternal Grandfather   . Diabetes Maternal Grandfather   .  Stroke Paternal Grandmother   . Mental illness Paternal Grandmother   . Heart disease Paternal Grandmother     ROS: Review of Systems  Constitutional: Negative for weight loss.  Cardiovascular: Negative for chest pain and claudication.  Musculoskeletal: Negative for myalgias.    PHYSICAL EXAM: Blood pressure (!) 145/81, pulse 81, temperature 97.6 F (36.4 C), temperature source Oral, height 5\' 5"  (1.651 m), weight 187 lb (84.8 kg), SpO2 98 %. Body mass index is 31.12 kg/m. Physical Exam Vitals signs reviewed.  Constitutional:      Appearance: Normal appearance. She is obese.  Cardiovascular:     Rate and Rhythm: Normal rate.     Pulses: Normal pulses.  Pulmonary:     Effort: Pulmonary  effort is normal.     Breath sounds: Normal breath sounds.  Musculoskeletal: Normal range of motion.  Skin:    General: Skin is warm and dry.  Neurological:     Mental Status: She is alert and oriented to person, place, and time.  Psychiatric:        Mood and Affect: Mood normal.        Behavior: Behavior normal.     RECENT LABS AND TESTS: BMET    Component Value Date/Time   NA 139 11/27/2017 1203   K 4.3 11/27/2017 1203   CL 99 11/27/2017 1203   CO2 24 11/27/2017 1203   GLUCOSE 84 11/27/2017 1203   GLUCOSE 91 04/06/2015 1715   BUN 11 11/27/2017 1203   CREATININE 0.89 11/27/2017 1203   CALCIUM 9.7 11/27/2017 1203   GFRNONAA 79 11/27/2017 1203   GFRAA 91 11/27/2017 1203   Lab Results  Component Value Date   HGBA1C 5.4 01/13/2019   HGBA1C 5.3 07/16/2018   HGBA1C 5.4 03/12/2018   HGBA1C 5.7 (H) 11/27/2017   Lab Results  Component Value Date   INSULIN 8.2 01/13/2019   INSULIN 8.2 07/16/2018   INSULIN 6.7 03/12/2018   INSULIN 12.2 11/27/2017   CBC    Component Value Date/Time   WBC 8.0 11/27/2017 1203   WBC 10.4 04/06/2015 1715   RBC 4.54 11/27/2017 1203   RBC 4.58 04/06/2015 1715   HGB 13.6 11/27/2017 1203   HCT 38.8 11/27/2017 1203   PLT 343 04/06/2015 1715    MCV 86 11/27/2017 1203   MCH 30.0 11/27/2017 1203   MCH 30.3 04/06/2015 1715   MCHC 35.1 11/27/2017 1203   MCHC 33.7 04/06/2015 1715   RDW 14.5 11/27/2017 1203   LYMPHSABS 2.2 11/27/2017 1203   MONOABS 0.6 04/06/2015 1715   EOSABS 0.2 11/27/2017 1203   BASOSABS 0.0 11/27/2017 1203   Iron/TIBC/Ferritin/ %Sat No results found for: IRON, TIBC, FERRITIN, IRONPCTSAT Lipid Panel     Component Value Date/Time   CHOL 196 01/13/2019 1028   TRIG 71 01/13/2019 1028   HDL 43 01/13/2019 1028   LDLCALC 139 (H) 01/13/2019 1028   Hepatic Function Panel     Component Value Date/Time   PROT 7.8 11/27/2017 1203   ALBUMIN 4.8 11/27/2017 1203   AST 20 11/27/2017 1203   ALT 20 11/27/2017 1203   ALKPHOS 42 11/27/2017 1203   BILITOT 0.4 11/27/2017 1203      Component Value Date/Time   TSH 0.667 11/27/2017 1203   TSH  05/26/2009 1040      OBESITY BEHAVIORAL INTERVENTION VISIT  Today's visit was # 34   Starting weight: 199 lbs Starting date: 11/27/17 Today's weight : 187 lbs  Today's date: 06/24/2019 Total lbs lost to date: 12    ASK: We discussed the diagnosis of obesity with Rinda Octaviano Glow today and Floris agreed to give [name] permission to discuss obesity behavioral modification therapy today.  ASSESS: Brecken has the diagnosis of obesity and her BMI today is 31.12 Erinn is in the action stage of change   ADVISE: Baylee was educated on the multiple health risks of obesity as well as the benefit of weight loss to improve her health. She was advised of the need for long term treatment and the importance of lifestyle modifications to improve her current health and to decrease her risk of future health problems.  AGREE: Multiple dietary modification options and treatment options were discussed and  Landri agreed to follow the recommendations documented  in the above note.  ARRANGE: Mariesa was educated on the importance of frequent visits to treat obesity as outlined per CMS  and USPSTF guidelines and agreed to schedule her next follow up appointment today.  I, Trixie Dredge, am acting as transcriptionist for Ilene Qua, MD  I have reviewed the above documentation for accuracy and completeness, and I agree with the above. - Ilene Qua, MD

## 2019-07-09 ENCOUNTER — Encounter (INDEPENDENT_AMBULATORY_CARE_PROVIDER_SITE_OTHER): Payer: Self-pay | Admitting: Family Medicine

## 2019-07-09 NOTE — Telephone Encounter (Signed)
FYI

## 2019-07-14 ENCOUNTER — Other Ambulatory Visit: Payer: Self-pay

## 2019-07-14 ENCOUNTER — Ambulatory Visit (INDEPENDENT_AMBULATORY_CARE_PROVIDER_SITE_OTHER): Payer: No Typology Code available for payment source | Admitting: Physician Assistant

## 2019-07-14 VITALS — BP 120/79 | HR 89 | Temp 98.0°F | Ht 65.0 in | Wt 190.0 lb

## 2019-07-14 DIAGNOSIS — R7303 Prediabetes: Secondary | ICD-10-CM | POA: Diagnosis not present

## 2019-07-14 DIAGNOSIS — E669 Obesity, unspecified: Secondary | ICD-10-CM | POA: Diagnosis not present

## 2019-07-14 DIAGNOSIS — Z9189 Other specified personal risk factors, not elsewhere classified: Secondary | ICD-10-CM

## 2019-07-14 DIAGNOSIS — Z6830 Body mass index (BMI) 30.0-30.9, adult: Secondary | ICD-10-CM

## 2019-07-14 DIAGNOSIS — E7849 Other hyperlipidemia: Secondary | ICD-10-CM | POA: Diagnosis not present

## 2019-07-14 MED ORDER — METFORMIN HCL 500 MG PO TABS: 500.0000 mg | ORAL_TABLET | Freq: Every day | ORAL | 0 refills | Status: DC

## 2019-07-15 NOTE — Progress Notes (Signed)
Office: [phone]  /  Fax: [phone]   HPI:   Chief Complaint: OBESITY Robin Knapp is here to discuss her progress with her obesity treatment plan. She is on the keep a food journal with 1350 to 1450 calories and 85 grams of protein daily plan and she is following her eating plan approximately 80 % of the time. She states she is doing dance cardio 45 minutes 3 times per week. Robin Knapp reports a lack of hunger during the day after starting metformin. Her weight is 190 lb (86.2 kg) today and has had a weight gain of 3 pounds over a period of 3 weeks since her last visit. She has lost 9 lbs since starting treatment with [name].  Pre-Diabetes Robin Knapp has a diagnosis of prediabetes based on her elevated Hgb A1c and was informed this puts her at greater risk of developing diabetes. Robin Knapp is taking metformin currently and she denies nausea, vomiting or diarrhea. She continues to work on diet and exercise to decrease risk of diabetes. She denies polyphagia.  At risk for diabetes Robin Knapp is at higher than average risk for developing diabetes due to her obesity and prediabetes. She currently denies polyuria or polydipsia.  Hyperlipidemia Robin Knapp has hyperlipidemia and has been trying to improve her cholesterol levels with intensive lifestyle modification including a low saturated fat diet, exercise and weight loss. She denies any chest pain.  ASSESSMENT AND PLAN:  Prediabetes - Plan: metFORMIN (GLUCOPHAGE) 500 MG tablet  Other hyperlipidemia  At risk for diabetes mellitus  Class 1 obesity with serious comorbidity and body mass index (BMI) of 30.0 to 30.9 in adult, unspecified obesity type  PLAN:  Pre-Diabetes Robin Knapp will continue to work on weight loss, exercise, and decreasing simple carbohydrates in her diet to help decrease the risk of diabetes. We dicussed metformin including benefits and risks. She was informed that eating too many simple carbohydrates or too many calories at one  sitting increases the likelihood of GI side effects. Robin Knapp agreed to continue metformin 500 mg daily at bedtime #90 with no refills and follow up with [name] as directed to monitor her progress.  Diabetes risk counseling Robin Knapp was given extended (15 minutes) diabetes prevention counseling today. She is 48 y.o. female and has risk factors for diabetes including obesity and prediabetes. We discussed intensive lifestyle modifications today with an emphasis on weight loss as well as increasing exercise and decreasing simple carbohydrates in her diet.  Hyperlipidemia Robin Knapp was informed of the American Heart Association Guidelines emphasizing intensive lifestyle modifications as the first line treatment for hyperlipidemia. We discussed many lifestyle modifications today in depth, and Robin Knapp will continue to work on decreasing saturated fats such as fatty red meat, butter and many fried foods. She will also increase vegetables and lean protein in her diet and continue to work on exercise and weight loss efforts.  Obesity Robin Knapp is currently in the action stage of change. As such, her goal is to continue with weight loss efforts She has agreed to change to the Category 3 plan Robin Knapp has been instructed to work up to a goal of 150 minutes of combined cardio and strengthening exercise per week for weight loss and overall health benefits. We discussed the following Behavioral Modification Strategies today: keeping healthy foods in the home and work on meal planning and easy cooking plans  Robin Knapp has agreed to follow up with our clinic in 2 weeks. She was informed of the importance of frequent follow up visits to maximize her success with intensive  lifestyle modifications for her multiple health conditions.  ALLERGIES: Allergies  Allergen Reactions   Sulfa Antibiotics Hives    itching   Sunscreens     MEDICATIONS: Current Outpatient Medications on File Prior to Visit  Medication Sig Dispense  Refill   acetaminophen (TYLENOL 8 HOUR ARTHRITIS PAIN) 650 MG CR tablet Take 650 mg by mouth every 8 (eight) hours as needed for pain.     albuterol (PROVENTIL HFA;VENTOLIN HFA) 108 (90 Base) MCG/ACT inhaler Inhale 2 puffs into the lungs every 6 (six) hours as needed for wheezing or shortness of breath.     Cholecalciferol ([employer] VITAMIN D3) 10000 units CAPS Take by mouth.     fexofenadine-pseudoephedrine (ALLEGRA-D) 60-120 MG 12 hr tablet Take 1 tablet by mouth 2 (two) times daily.     ibuprofen (ADVIL,MOTRIN) 600 MG tablet Take 600 mg by mouth every 6 (six) hours as needed.     levonorgestrel (MIRENA) 20 MCG/24HR IUD 1 each by Intrauterine route once.     Magnesium 250 MG TABS Take 1 tablet by mouth daily.     montelukast (SINGULAIR) 10 MG tablet Take 10 mg by mouth at bedtime.     Multiple Vitamins-Minerals (HAIR SKIN & NAILS ADVANCED PO) Take 1 tablet by mouth daily.     No current facility-administered medications on file prior to visit.     PAST MEDICAL HISTORY: Past Medical History:  Diagnosis Date   Allergy    Asthma    Breast lump    Constipation    Dry skin    Fatigue    GERD (gastroesophageal reflux disease)    pregnancy related-zantac   HTN (hypertension)    Hyperlipidemia    Infertility, female    Joint pain    Lactose intolerance    Missed abortion    6weeks 3days   Muscle pain    Obesity    Palpitations    PCOS (polycystic ovarian syndrome)    Seasonal allergies    Skin rash    Stress    Vitamin D deficiency     PAST SURGICAL HISTORY: Past Surgical History:  Procedure Laterality Date   BREAST BIOPSY     CESAREAN SECTION     DILATION AND EVACUATION  05/09/2012   Procedure: DILATATION AND EVACUATION;  Surgeon: Marvene Staff, MD;  Location: [facility];  Service: Gynecology;  Laterality: N/A;   EYE SURGERY     LASIK     PRK     WISDOM TOOTH EXTRACTION  1994    SOCIAL HISTORY: Social History   Tobacco Use    Smoking status: Never Smoker   Smokeless tobacco: Never Used  Substance Use Topics   Alcohol use: No    Alcohol/week: 0.0 standard drinks   Drug use: No    FAMILY HISTORY: Family History  Problem Relation Age of Onset   Hyperlipidemia Mother    Hyperlipidemia Father    Hypertension Father    Anxiety disorder Father    Hyperlipidemia Maternal Grandmother    Hypertension Maternal Grandmother    Heart disease Maternal Grandmother    Heart disease Maternal Grandfather    Diabetes Maternal Grandfather    Stroke Paternal Grandmother    Mental illness Paternal Grandmother    Heart disease Paternal Grandmother     ROS: Review of Systems  Constitutional: Negative for weight loss.  Cardiovascular: Negative for chest pain.  Gastrointestinal: Negative for diarrhea, nausea and vomiting.  Genitourinary: Negative for frequency.  Endo/Heme/Allergies: Negative for polydipsia.  Negative for polyphagia    PHYSICAL EXAM: Blood pressure 120/79, pulse 89, temperature 98 F (36.7 C), temperature source Oral, height 5\' 5"  (1.651 m), weight 190 lb (86.2 kg), SpO2 98 %. Body mass index is 31.62 kg/m. Physical Exam Vitals signs reviewed.  Constitutional:      Appearance: Normal appearance. She is well-developed. She is obese.  Cardiovascular:     Rate and Rhythm: Normal rate.  Pulmonary:     Effort: Pulmonary effort is normal.  Musculoskeletal: Normal range of motion.  Skin:    General: Skin is warm and dry.  Neurological:     Mental Status: She is alert and oriented to person, place, and time.  Psychiatric:        Mood and Affect: Mood normal.        Behavior: Behavior normal.     RECENT LABS AND TESTS: BMET    Component Value Date/Time   NA 139 11/27/2017 1203   K 4.3 11/27/2017 1203   CL 99 11/27/2017 1203   CO2 24 11/27/2017 1203   GLUCOSE 84 11/27/2017 1203   GLUCOSE 91 04/06/2015 1715   BUN 11 11/27/2017 1203   CREATININE 0.89 11/27/2017 1203    CALCIUM 9.7 11/27/2017 1203   GFRNONAA 79 11/27/2017 1203   GFRAA 91 11/27/2017 1203   Lab Results  Component Value Date   HGBA1C 5.4 01/13/2019   HGBA1C 5.3 07/16/2018   HGBA1C 5.4 03/12/2018   HGBA1C 5.7 (H) 11/27/2017   Lab Results  Component Value Date   INSULIN 8.2 01/13/2019   INSULIN 8.2 07/16/2018   INSULIN 6.7 03/12/2018   INSULIN 12.2 11/27/2017   CBC    Component Value Date/Time   WBC 8.0 11/27/2017 1203   WBC 10.4 04/06/2015 1715   RBC 4.54 11/27/2017 1203   RBC 4.58 04/06/2015 1715   HGB 13.6 11/27/2017 1203   HCT 38.8 11/27/2017 1203   PLT 343 04/06/2015 1715   MCV 86 11/27/2017 1203   MCH 30.0 11/27/2017 1203   MCH 30.3 04/06/2015 1715   MCHC 35.1 11/27/2017 1203   MCHC 33.7 04/06/2015 1715   RDW 14.5 11/27/2017 1203   LYMPHSABS 2.2 11/27/2017 1203   MONOABS 0.6 04/06/2015 1715   EOSABS 0.2 11/27/2017 1203   BASOSABS 0.0 11/27/2017 1203   Iron/TIBC/Ferritin/ %Sat No results found for: IRON, TIBC, FERRITIN, IRONPCTSAT Lipid Panel     Component Value Date/Time   CHOL 196 01/13/2019 1028   TRIG 71 01/13/2019 1028   HDL 43 01/13/2019 1028   LDLCALC 139 (H) 01/13/2019 1028   Hepatic Function Panel     Component Value Date/Time   PROT 7.8 11/27/2017 1203   ALBUMIN 4.8 11/27/2017 1203   AST 20 11/27/2017 1203   ALT 20 11/27/2017 1203   ALKPHOS 42 11/27/2017 1203   BILITOT 0.4 11/27/2017 1203      Ref. Range 01/13/2019 10:28  Vitamin D, 25-Hydroxy Latest Ref Range: 30.0 - 100.0 ng/mL 60.5    OBESITY BEHAVIORAL INTERVENTION VISIT  Today's visit was # 35  Starting weight: 199 lbs Starting date: 11/27/2017 Today's weight : 190 lbs Today's date: 07/14/2019 Total lbs lost to date: 9    07/14/2019  Height 5\' 5"  (1.651 m)  Weight 190 lb (86.2 kg)  BMI (Calculated) 31.62  BLOOD PRESSURE - SYSTOLIC [id number]  BLOOD PRESSURE - DIASTOLIC 79   Body Fat % [id number] %  Total Body Water (lbs) 77.4 lbs    ASK: We discussed the diagnosis of obesity with  Jamaris Octaviano Glow today and Rionna agreed to give [name] permission to discuss obesity behavioral modification therapy today.  ASSESS: Kellsie has the diagnosis of obesity and her BMI today is 31.62 Nasrin is in the action stage of change   ADVISE: Sibyl was educated on the multiple health risks of obesity as well as the benefit of weight loss to improve her health. She was advised of the need for long term treatment and the importance of lifestyle modifications to improve her current health and to decrease her risk of future health problems.  AGREE: Multiple dietary modification options and treatment options were discussed and  Jhane agreed to follow the recommendations documented in the above note.  ARRANGE: Aylah was educated on the importance of frequent visits to treat obesity as outlined per CMS and USPSTF guidelines and agreed to schedule her next follow up appointment today.  Corey Skains, am acting as transcriptionist for Abby Potash, PA-C I, Abby Potash, PA-C have reviewed above note and agree with its content

## 2019-08-04 ENCOUNTER — Other Ambulatory Visit: Payer: Self-pay

## 2019-08-04 ENCOUNTER — Encounter (INDEPENDENT_AMBULATORY_CARE_PROVIDER_SITE_OTHER): Payer: Self-pay | Admitting: Physician Assistant

## 2019-08-04 ENCOUNTER — Ambulatory Visit (INDEPENDENT_AMBULATORY_CARE_PROVIDER_SITE_OTHER): Payer: No Typology Code available for payment source | Admitting: Physician Assistant

## 2019-08-04 VITALS — BP 143/82 | HR 75 | Temp 98.0°F | Ht 65.0 in | Wt 190.0 lb

## 2019-08-04 DIAGNOSIS — E669 Obesity, unspecified: Secondary | ICD-10-CM

## 2019-08-04 DIAGNOSIS — E7849 Other hyperlipidemia: Secondary | ICD-10-CM | POA: Diagnosis not present

## 2019-08-04 DIAGNOSIS — Z6831 Body mass index (BMI) 31.0-31.9, adult: Secondary | ICD-10-CM

## 2019-08-05 NOTE — Progress Notes (Signed)
Office: [phone]  /  Fax: [phone]   HPI:   Chief Complaint: OBESITY Robin Knapp is here to discuss her progress with her obesity treatment plan. She is on the Category 3 plan and is following her eating plan approximately 50 % of the time. She states she is dancing for 45 minutes 3 times per week. Areeba reports that she is not getting all of her food in, especially at dinner. She is asking for different snack ideas. Her weight is 190 lb (86.2 kg) today and she has maintained weight since her last visit. She has lost 9 lbs since starting treatment with [name].  Hyperlipidemia Robin Knapp has hyperlipidemia and she is not on medications. She has been trying to improve her cholesterol levels with intensive lifestyle modification including a low saturated fat diet, exercise and weight loss. She denies any chest pain.  ASSESSMENT AND PLAN:  Other hyperlipidemia  Class 1 obesity with serious comorbidity and body mass index (BMI) of 31.0 to 31.9 in adult, unspecified obesity type  PLAN:  Hyperlipidemia Robin Knapp was informed of the American Heart Association Guidelines emphasizing intensive lifestyle modifications as the first line treatment for hyperlipidemia. We discussed many lifestyle modifications today in depth, and Robin Knapp will continue to work on decreasing saturated fats such as fatty red meat, butter and many fried foods. She will also increase vegetables and lean protein in her diet and continue to work on exercise and weight loss efforts.  I spent > than 50% of the 25 minute visit on counseling as documented in the note.  Obesity Robin Knapp is currently in the action stage of change. As such, her goal is to continue with weight loss efforts She has agreed to follow the Category 3 plan Robin Knapp has been instructed to work up to a goal of 150 minutes of combined cardio and strengthening exercise per week for weight loss and overall health benefits. We discussed the following Behavioral  Modification Strategies today: keeping healthy foods in the home and work on meal planning and easy cooking plans   Robin Knapp has agreed to follow up with our clinic in 3 weeks. She was informed of the importance of frequent follow up visits to maximize her success with intensive lifestyle modifications for her multiple health conditions.  I spent > than 50% of the 25 minute visit on counseling as documented in the note.    ALLERGIES: Allergies  Allergen Reactions   Sulfa Antibiotics Hives    itching   Sunscreens     MEDICATIONS: Current Outpatient Medications on File Prior to Visit  Medication Sig Dispense Refill   acetaminophen (TYLENOL 8 HOUR ARTHRITIS PAIN) 650 MG CR tablet Take 650 mg by mouth every 8 (eight) hours as needed for pain.     albuterol (PROVENTIL HFA;VENTOLIN HFA) 108 (90 Base) MCG/ACT inhaler Inhale 2 puffs into the lungs every 6 (six) hours as needed for wheezing or shortness of breath.     Cholecalciferol ([employer] VITAMIN D3) 10000 units CAPS Take by mouth.     fexofenadine-pseudoephedrine (ALLEGRA-D) 60-120 MG 12 hr tablet Take 1 tablet by mouth 2 (two) times daily.     ibuprofen (ADVIL,MOTRIN) 600 MG tablet Take 600 mg by mouth every 6 (six) hours as needed.     levonorgestrel (MIRENA) 20 MCG/24HR IUD 1 each by Intrauterine route once.     Magnesium 250 MG TABS Take 1 tablet by mouth daily.     metFORMIN (GLUCOPHAGE) 500 MG tablet Take 1 tablet (500 mg total) by mouth at  bedtime. 90 tablet 0   montelukast (SINGULAIR) 10 MG tablet Take 10 mg by mouth at bedtime.     Multiple Vitamins-Minerals (HAIR SKIN & NAILS ADVANCED PO) Take 1 tablet by mouth daily.     No current facility-administered medications on file prior to visit.     PAST MEDICAL HISTORY: Past Medical History:  Diagnosis Date   Allergy    Asthma    Breast lump    Constipation    Dry skin    Fatigue    GERD (gastroesophageal reflux disease)    pregnancy related-zantac   HTN  (hypertension)    Hyperlipidemia    Infertility, female    Joint pain    Lactose intolerance    Missed abortion    6weeks 3days   Muscle pain    Obesity    Palpitations    PCOS (polycystic ovarian syndrome)    Seasonal allergies    Skin rash    Stress    Vitamin D deficiency     PAST SURGICAL HISTORY: Past Surgical History:  Procedure Laterality Date   BREAST BIOPSY     CESAREAN SECTION     DILATION AND EVACUATION  05/09/2012   Procedure: DILATATION AND EVACUATION;  Surgeon: Marvene Staff, MD;  Location: [facility];  Service: Gynecology;  Laterality: N/A;   EYE SURGERY     LASIK     PRK     WISDOM TOOTH EXTRACTION  1994    SOCIAL HISTORY: Social History   Tobacco Use   Smoking status: Never Smoker   Smokeless tobacco: Never Used  Substance Use Topics   Alcohol use: No    Alcohol/week: 0.0 standard drinks   Drug use: No    FAMILY HISTORY: Family History  Problem Relation Age of Onset   Hyperlipidemia Mother    Hyperlipidemia Father    Hypertension Father    Anxiety disorder Father    Hyperlipidemia Maternal Grandmother    Hypertension Maternal Grandmother    Heart disease Maternal Grandmother    Heart disease Maternal Grandfather    Diabetes Maternal Grandfather    Stroke Paternal Grandmother    Mental illness Paternal Grandmother    Heart disease Paternal Grandmother     ROS: Review of Systems  Constitutional: Negative for weight loss.  Cardiovascular: Negative for chest pain.    PHYSICAL EXAM: Blood pressure (!) 143/82, pulse 75, temperature 98 F (36.7 C), temperature source Oral, height 5\' 5"  (1.651 m), weight 190 lb (86.2 kg), SpO2 98 %. Body mass index is 31.62 kg/m. Physical Exam Vitals signs reviewed.  Constitutional:      Appearance: Normal appearance. She is well-developed. She is obese.  Cardiovascular:     Rate and Rhythm: Normal rate.  Pulmonary:     Effort: Pulmonary effort is normal.    Musculoskeletal: Normal range of motion.  Skin:    General: Skin is warm and dry.  Neurological:     Mental Status: She is alert and oriented to person, place, and time.  Psychiatric:        Mood and Affect: Mood normal.        Behavior: Behavior normal.     RECENT LABS AND TESTS: BMET    Component Value Date/Time   NA 139 11/27/2017 1203   K 4.3 11/27/2017 1203   CL 99 11/27/2017 1203   CO2 24 11/27/2017 1203   GLUCOSE 84 11/27/2017 1203   GLUCOSE 91 04/06/2015 1715   BUN 11 11/27/2017 1203  CREATININE 0.89 11/27/2017 1203   CALCIUM 9.7 11/27/2017 1203   GFRNONAA 79 11/27/2017 1203   GFRAA 91 11/27/2017 1203   Lab Results  Component Value Date   HGBA1C 5.4 01/13/2019   HGBA1C 5.3 07/16/2018   HGBA1C 5.4 03/12/2018   HGBA1C 5.7 (H) 11/27/2017   Lab Results  Component Value Date   INSULIN 8.2 01/13/2019   INSULIN 8.2 07/16/2018   INSULIN 6.7 03/12/2018   INSULIN 12.2 11/27/2017   CBC    Component Value Date/Time   WBC 8.0 11/27/2017 1203   WBC 10.4 04/06/2015 1715   RBC 4.54 11/27/2017 1203   RBC 4.58 04/06/2015 1715   HGB 13.6 11/27/2017 1203   HCT 38.8 11/27/2017 1203   PLT 343 04/06/2015 1715   MCV 86 11/27/2017 1203   MCH 30.0 11/27/2017 1203   MCH 30.3 04/06/2015 1715   MCHC 35.1 11/27/2017 1203   MCHC 33.7 04/06/2015 1715   RDW 14.5 11/27/2017 1203   LYMPHSABS 2.2 11/27/2017 1203   MONOABS 0.6 04/06/2015 1715   EOSABS 0.2 11/27/2017 1203   BASOSABS 0.0 11/27/2017 1203   Iron/TIBC/Ferritin/ %Sat No results found for: IRON, TIBC, FERRITIN, IRONPCTSAT Lipid Panel     Component Value Date/Time   CHOL 196 01/13/2019 1028   TRIG 71 01/13/2019 1028   HDL 43 01/13/2019 1028   LDLCALC 139 (H) 01/13/2019 1028   Hepatic Function Panel     Component Value Date/Time   PROT 7.8 11/27/2017 1203   ALBUMIN 4.8 11/27/2017 1203   AST 20 11/27/2017 1203   ALT 20 11/27/2017 1203   ALKPHOS 42 11/27/2017 1203   BILITOT 0.4 11/27/2017 1203      Ref.  Range 01/13/2019 10:28  Vitamin D, 25-Hydroxy Latest Ref Range: 30.0 - 100.0 ng/mL 60.5    OBESITY BEHAVIORAL INTERVENTION VISIT  Today's visit was # 36  Starting weight: 199 lbs Starting date: 11/27/2017 Today's weight : 190 lbs Today's date: 08/04/2019 Total lbs lost to date: 9    08/04/2019  Height 5\' 5"  (1.651 m)  Weight 190 lb (86.2 kg)  BMI (Calculated) 31.62  BLOOD PRESSURE - SYSTOLIC [id number]  BLOOD PRESSURE - DIASTOLIC 82   Body Fat % 39 %  Total Body Water (lbs) 76.8 lbs    ASK: We discussed the diagnosis of obesity with Robin Knapp today and Robin Knapp agreed to give [name] permission to discuss obesity behavioral modification therapy today.  ASSESS: Robin Knapp has the diagnosis of obesity and her BMI today is 31.62 Robin Knapp is in the action stage of change   ADVISE: Robin Knapp was educated on the multiple health risks of obesity as well as the benefit of weight loss to improve her health. She was advised of the need for long term treatment and the importance of lifestyle modifications to improve her current health and to decrease her risk of future health problems.  AGREE: Multiple dietary modification options and treatment options were discussed and  Robin Knapp agreed to follow the recommendations documented in the above note.  ARRANGE: Robin Knapp was educated on the importance of frequent visits to treat obesity as outlined per CMS and USPSTF guidelines and agreed to schedule her next follow up appointment today.  Corey Skains, am acting as transcriptionist for Abby Potash, PA-C I, Abby Potash, PA-C have reviewed above note and agree with its content

## 2019-08-07 MED FILL — metFORMIN HCL 500 MG TABS: 500 | 90 days supply | Qty: 90 | Fill #0

## 2019-09-01 ENCOUNTER — Encounter (INDEPENDENT_AMBULATORY_CARE_PROVIDER_SITE_OTHER): Payer: Self-pay | Admitting: Physician Assistant

## 2019-09-01 ENCOUNTER — Ambulatory Visit (INDEPENDENT_AMBULATORY_CARE_PROVIDER_SITE_OTHER): Payer: No Typology Code available for payment source | Admitting: Physician Assistant

## 2019-09-01 ENCOUNTER — Other Ambulatory Visit: Payer: Self-pay

## 2019-09-01 VITALS — BP 156/92 | HR 98 | Temp 97.8°F | Ht 65.0 in | Wt 192.0 lb

## 2019-09-01 DIAGNOSIS — E669 Obesity, unspecified: Secondary | ICD-10-CM | POA: Diagnosis not present

## 2019-09-01 DIAGNOSIS — Z6832 Body mass index (BMI) 32.0-32.9, adult: Secondary | ICD-10-CM

## 2019-09-01 DIAGNOSIS — R7303 Prediabetes: Secondary | ICD-10-CM | POA: Diagnosis not present

## 2019-09-03 NOTE — Progress Notes (Signed)
Office: [phone]  /  Fax: [phone]   HPI:  Chief Complaint: OBESITY Robin Knapp is here to discuss her progress with her obesity treatment plan. She is on the Category 3 plan and states she is following her eating plan approximately 50 % of the time. She states she is doing cardio exercise 45 minutes 2 to 3 times per week.  Robin Knapp reports that she is struggling to get in all of the food on the Category 3 plan. She is not eating enough protein at dinner, and she does not feel like eating when she gets home.  Pre-Diabetes Robin Knapp has a diagnosis of prediabetes and she is on Metformin. She has no nausea, vomiting, diarrhea or polyphagia.     Today's visit was # 60 Starting weight: 199 lbs Starting date: 11/27/2017 Today's weight : 192 lbs Today's date: 09/01/2019 Total lbs lost to date: 7 Total lbs lost since last in-office visit: 0  ASSESSMENT AND PLAN:  Prediabetes  Class 1 obesity with serious comorbidity and body mass index (BMI) of 32.0 to 32.9 in adult, unspecified obesity type  PLAN:  Pre-Diabetes Robin Knapp will continue to work on weight loss, exercise, and decreasing simple carbohydrates to help decrease the risk of diabetes. Robin Knapp will continue metformin and follow up at the agreed upon time.  Obesity Robin Knapp is currently in the action stage of change. As such, her goal is to continue with weight loss efforts She has agreed to follow the Category 3 plan Robin Knapp has been instructed to work up to a goal of 150 minutes of combined cardio and strengthening exercise per week for weight loss and overall health benefits. We discussed the following Behavioral Modification Strategies today: increasing lean protein intake and work on meal planning and easy cooking plans  Robin Knapp has agreed to follow up with our clinic in 3 weeks. She was informed of the importance of frequent follow up visits to maximize her success with intensive lifestyle modifications for her multiple  health conditions.  I spent > than 50% of the 28 minute visit on counseling as documented in the note.   ALLERGIES: Allergies  Allergen Reactions  . Sulfa Antibiotics Hives    itching  . Sunscreens     MEDICATIONS: Current Outpatient Medications on File Prior to Visit  Medication Sig Dispense Refill  . acetaminophen (TYLENOL 8 HOUR ARTHRITIS PAIN) 650 MG CR tablet Take 650 mg by mouth every 8 (eight) hours as needed for pain.    [name] albuterol (PROVENTIL HFA;VENTOLIN HFA) 108 (90 Base) MCG/ACT inhaler Inhale 2 puffs into the lungs every 6 (six) hours as needed for wheezing or shortness of breath.    . Cholecalciferol ([employer] VITAMIN D3) 10000 units CAPS Take by mouth.    . fexofenadine-pseudoephedrine (ALLEGRA-D) 60-120 MG 12 hr tablet Take 1 tablet by mouth 2 (two) times daily.    [name] ibuprofen (ADVIL,MOTRIN) 600 MG tablet Take 600 mg by mouth every 6 (six) hours as needed.    [name] levonorgestrel (MIRENA) 20 MCG/24HR IUD 1 each by Intrauterine route once.    . Magnesium 250 MG TABS Take 1 tablet by mouth daily.    . metFORMIN (GLUCOPHAGE) 500 MG tablet Take 1 tablet (500 mg total) by mouth at bedtime. 90 tablet 0  . montelukast (SINGULAIR) 10 MG tablet Take 10 mg by mouth at bedtime.    . Multiple Vitamins-Minerals (HAIR SKIN & NAILS ADVANCED PO) Take 1 tablet by mouth daily.     No current facility-administered medications on file prior  to visit.    PAST MEDICAL HISTORY: Past Medical History:  Diagnosis Date  . Allergy   . Asthma   . Breast lump   . Constipation   . Dry skin   . Fatigue   . GERD (gastroesophageal reflux disease)    pregnancy related-zantac  . HTN (hypertension)   . Hyperlipidemia   . Infertility, female   . Joint pain   . Lactose intolerance   . Missed abortion    6weeks 3days  . Muscle pain   . Obesity   . Palpitations   . PCOS (polycystic ovarian syndrome)   . Seasonal allergies   . Skin rash   . Stress   . Vitamin D deficiency     PAST SURGICAL  HISTORY: Past Surgical History:  Procedure Laterality Date  . BREAST BIOPSY    . CESAREAN SECTION    . DILATION AND EVACUATION  05/09/2012   Procedure: DILATATION AND EVACUATION;  Surgeon: Marvene Staff, MD;  Location: [facility];  Service: Gynecology;  Laterality: N/A;  . EYE SURGERY    . LASIK    . [place]    . WISDOM TOOTH EXTRACTION  1994    SOCIAL HISTORY: Social History   Tobacco Use  . Smoking status: Never Smoker  . Smokeless tobacco: Never Used  Substance Use Topics  . Alcohol use: No    Alcohol/week: 0.0 standard drinks  . Drug use: No    FAMILY HISTORY: Family History  Problem Relation Age of Onset  . Hyperlipidemia Mother   . Hyperlipidemia Father   . Hypertension Father   . Anxiety disorder Father   . Hyperlipidemia Maternal Grandmother   . Hypertension Maternal Grandmother   . Heart disease Maternal Grandmother   . Heart disease Maternal Grandfather   . Diabetes Maternal Grandfather   . Stroke Paternal Grandmother   . Mental illness Paternal Grandmother   . Heart disease Paternal Grandmother     ROS: Review of Systems  Constitutional: Negative for weight loss.  Gastrointestinal: Negative for diarrhea, nausea and vomiting.  Endo/Heme/Allergies:       Negative for polyphagia    PHYSICAL EXAM: Blood pressure (!) 156/92, pulse 98, temperature 97.8 F (36.6 C), temperature source Oral, height 5\' 5"  (1.651 m), weight 192 lb (87.1 kg), SpO2 100 %. Body mass index is 31.95 kg/m. Physical Exam Vitals reviewed.  Constitutional:      General: She is not in acute distress.    Appearance: Normal appearance. She is well-developed. She is obese.  Cardiovascular:     Rate and Rhythm: Normal rate.  Pulmonary:     Effort: Pulmonary effort is normal.  Musculoskeletal:        General: Normal range of motion.  Skin:    General: Skin is warm and dry.  Neurological:     Mental Status: She is alert and oriented to person, place, and time.  Psychiatric:         Mood and Affect: Mood normal.        Behavior: Behavior normal.     RECENT LABS AND TESTS: BMET    Component Value Date/Time   NA 139 11/27/2017 1203   K 4.3 11/27/2017 1203   CL 99 11/27/2017 1203   CO2 24 11/27/2017 1203   GLUCOSE 84 11/27/2017 1203   GLUCOSE 91 04/06/2015 1715   BUN 11 11/27/2017 1203   CREATININE 0.89 11/27/2017 1203   CALCIUM 9.7 11/27/2017 1203   GFRNONAA 79 11/27/2017 1203   GFRAA  91 11/27/2017 1203   Lab Results  Component Value Date   HGBA1C 5.4 01/13/2019   HGBA1C 5.3 07/16/2018   HGBA1C 5.4 03/12/2018   HGBA1C 5.7 (H) 11/27/2017   Lab Results  Component Value Date   INSULIN 8.2 01/13/2019   INSULIN 8.2 07/16/2018   INSULIN 6.7 03/12/2018   INSULIN 12.2 11/27/2017   CBC    Component Value Date/Time   WBC 8.0 11/27/2017 1203   WBC 10.4 04/06/2015 1715   RBC 4.54 11/27/2017 1203   RBC 4.58 04/06/2015 1715   HGB 13.6 11/27/2017 1203   HCT 38.8 11/27/2017 1203   PLT 343 04/06/2015 1715   MCV 86 11/27/2017 1203   MCH 30.0 11/27/2017 1203   MCH 30.3 04/06/2015 1715   MCHC 35.1 11/27/2017 1203   MCHC 33.7 04/06/2015 1715   RDW 14.5 11/27/2017 1203   LYMPHSABS 2.2 11/27/2017 1203   MONOABS 0.6 04/06/2015 1715   EOSABS 0.2 11/27/2017 1203   BASOSABS 0.0 11/27/2017 1203   Iron/TIBC/Ferritin/ %Sat No results found for: IRON, TIBC, FERRITIN, IRONPCTSAT Lipid Panel     Component Value Date/Time   CHOL 196 01/13/2019 1028   TRIG 71 01/13/2019 1028   HDL 43 01/13/2019 1028   LDLCALC 139 (H) 01/13/2019 1028   Hepatic Function Panel     Component Value Date/Time   PROT 7.8 11/27/2017 1203   ALBUMIN 4.8 11/27/2017 1203   AST 20 11/27/2017 1203   ALT 20 11/27/2017 1203   ALKPHOS 42 11/27/2017 1203   BILITOT 0.4 11/27/2017 1203      Ref. Range 01/13/2019 10:28  Vitamin D, 25-Hydroxy Latest Ref Range: 30.0 - 100.0 ng/mL 60.5    I, Doreene Nest, am acting as Location manager for Abby Potash, PA-C I, Abby Potash, PA-C have  reviewed above note and agree with its content

## 2019-09-24 ENCOUNTER — Encounter (INDEPENDENT_AMBULATORY_CARE_PROVIDER_SITE_OTHER): Payer: Self-pay | Admitting: Physician Assistant

## 2019-09-24 ENCOUNTER — Other Ambulatory Visit: Payer: Self-pay

## 2019-09-24 ENCOUNTER — Ambulatory Visit (INDEPENDENT_AMBULATORY_CARE_PROVIDER_SITE_OTHER): Payer: No Typology Code available for payment source | Admitting: Physician Assistant

## 2019-09-24 VITALS — BP 122/78 | HR 72 | Temp 98.0°F | Ht 65.0 in | Wt 191.0 lb

## 2019-09-24 DIAGNOSIS — R7303 Prediabetes: Secondary | ICD-10-CM

## 2019-09-24 DIAGNOSIS — E7849 Other hyperlipidemia: Secondary | ICD-10-CM | POA: Diagnosis not present

## 2019-09-24 DIAGNOSIS — E559 Vitamin D deficiency, unspecified: Secondary | ICD-10-CM | POA: Diagnosis not present

## 2019-09-24 DIAGNOSIS — Z9189 Other specified personal risk factors, not elsewhere classified: Secondary | ICD-10-CM | POA: Diagnosis not present

## 2019-09-24 DIAGNOSIS — Z6831 Body mass index (BMI) 31.0-31.9, adult: Secondary | ICD-10-CM

## 2019-09-24 DIAGNOSIS — E669 Obesity, unspecified: Secondary | ICD-10-CM

## 2019-09-25 LAB — LIPID PANEL WITH LDL/HDL RATIO
Cholesterol, Total: 217 mg/dL — ABNORMAL HIGH (ref 100–199)
HDL: 45 mg/dL (ref >39)
LDL Chol Calc (NIH): 160 mg/dL — ABNORMAL HIGH (ref 0–99)
LDL/HDL Ratio: 3.6 ratio — ABNORMAL HIGH (ref 0.0–3.2)
Triglycerides: 69 mg/dL (ref 0–149)
VLDL Cholesterol Cal: 12 mg/dL (ref 5–40)

## 2019-09-25 LAB — COMPREHENSIVE METABOLIC PANEL
ALT: 16 IU/L (ref 0–32)
AST: 15 IU/L (ref 0–40)
Albumin/Globulin Ratio: 1.8 (ref 1.2–2.2)
Albumin: 4.6 g/dL (ref 3.8–4.8)
Alkaline Phosphatase: 37 IU/L — ABNORMAL LOW (ref 39–117)
BUN/Creatinine Ratio: 15 (ref 9–23)
BUN: 14 mg/dL (ref 6–24)
Bilirubin Total: 0.4 mg/dL (ref 0.0–1.2)
CO2: 24 mmol/L (ref 20–29)
Calcium: 9.5 mg/dL (ref 8.7–10.2)
Chloride: 102 mmol/L (ref 96–106)
Creatinine, Ser: 0.93 mg/dL (ref 0.57–1.00)
GFR calc Af Amer: 85 mL/min/{1.73_m2} (ref >59)
GFR calc non Af Amer: 73 mL/min/{1.73_m2} (ref >59)
Globulin, Total: 2.5 g/dL (ref 1.5–4.5)
Glucose: 79 mg/dL (ref 65–99)
Potassium: 4.6 mmol/L (ref 3.5–5.2)
Sodium: 141 mmol/L (ref 134–144)
Total Protein: 7.1 g/dL (ref 6.0–8.5)

## 2019-09-25 LAB — HEMOGLOBIN A1C
Est. average glucose Bld gHb Est-mCnc: 108 mg/dL
Hgb A1c MFr Bld: 5.4 % (ref 4.8–5.6)

## 2019-09-25 LAB — VITAMIN D 25 HYDROXY (VIT D DEFICIENCY, FRACTURES): Vit D, 25-Hydroxy: 56.1 ng/mL (ref 30.0–100.0)

## 2019-09-25 LAB — INSULIN, RANDOM: INSULIN: 10.4 u[IU]/mL (ref 2.6–24.9)

## 2019-09-29 NOTE — Progress Notes (Signed)
Chief Complaint:   OBESITY Robin Knapp is here to discuss her progress with her obesity treatment plan along with follow-up of her obesity related diagnoses. Robin Knapp is on the Category 3 Plan and states she is following her eating plan approximately 50% of the time. Robin Knapp states she is doing cardio exercise 60 minutes 2 to 3 times per week.  Today's visit was #: 73 Starting weight: 199 lbs Starting date: 11/27/2017 Today's weight: 191 lbs Today's date: 09/24/2019 Total lbs lost to date: 8 Total lbs lost since last in-office visit: 1  Interim History: Robin Knapp reports that she is journaling Category 3. She continues to struggle to get all of the food in on the plan.  Subjective:   Vitamin D deficiency Robin Knapp is on 10,000 units of vitamin D, 5 days a week. Her last vitamin D level was 60.5 (01/13/19). She reports being allergic to the sun, and therefore she is not outside today. Her labs are due today.  Prediabetes  Robin Knapp is on metformin currently and she has no nausea, vomiting or diarrhea. Her last A1c was 5.4, and her last insulin level was 8.2. She has no polyphagia. Her labs are due.  Other hyperlipidemia  Robin Knapp is not on medications. Her last LDL was 139. She has no chest pain. Robin Knapp is due for labs.  At risk for diabetes mellitus Robin Knapp is at higher than average risk for developing diabetes due to her obesity and prediabetes.   Assessment/Plan:   Vitamin D deficiency Low Vitamin D level contributes to fatigue and are associated with obesity, breast, and colon cancer. Robin Knapp will taking Vitamin D supplementation and we will check labs. She will follow-up for routine testing of vitamin D, at least 2-3 times per year to avoid over-replacement.  Prediabetes  Robin Knapp will continue to work on weight loss, exercise, and decreasing simple carbohydrates to help decrease the risk of diabetes. Robin Knapp will continue with metformin and we will check labs today.  Other  hyperlipidemia Cardiovascular risk and specific lipid/LDL goals reviewed.  We discussed several lifestyle modifications today and Robin Knapp will continue to work on diet, exercise and weight loss efforts. Orders and follow up as documented in patient record.   Counseling Intensive lifestyle modifications are the first line treatment for this issue. . Dietary changes: Increase soluble fiber. Decrease simple carbohydrates. . Exercise changes: Moderate to vigorous-intensity aerobic activity 150 minutes per week if tolerated. . Lipid-lowering medications: see documented in medical record.  At risk for diabetes mellitus Robin Knapp was given approximately 15 minutes of diabetes education and counseling today. We discussed intensive lifestyle modifications today with an emphasis on weight loss as well as increasing exercise and decreasing simple carbohydrates in her diet. We also reviewed medication options with an emphasis on risk versus benefit of those discussed.   Obesity Robin Knapp is currently in the action stage of change. As such, her goal is to continue with weight loss efforts. She has agreed to on the Category 3 Plan.   We discussed the following exercise goals today: For substantial health benefits, adults should do at least 150 minutes (2 hours and 30 minutes) a week of moderate-intensity, or 75 minutes (1 hour and 15 minutes) a week of vigorous-intensity aerobic physical activity, or an equivalent combination of moderate- and vigorous-intensity aerobic activity. Aerobic activity should be performed in episodes of at least 10 minutes, and preferably, it should be spread throughout the week. Adults should also include muscle-strengthening activities that involve all major muscle groups  on 2 or more days a week.  We discussed the following behavioral modification strategies today: meal planning and cooking strategies and planning for success. Robin Knapp will increase her food intake at breakfast and  lunch. She will continue to journal.  Robin Knapp has agreed to follow-up with our clinic in 3 weeks. She was informed of the importance of frequent follow-up visits to maximize her success with intensive lifestyle modifications for her multiple health conditions.   Robin Knapp was informed we would discuss her lab results at her next visit unless there is a critical issue that needs to be addressed sooner. Robin Knapp agreed to keep her next visit at the agreed upon time to discuss these results.  Objective:   Blood pressure 122/78, pulse 72, temperature 98 F (36.7 C), temperature source Oral, height 5\' 5"  (1.651 m), weight 191 lb (86.6 kg), SpO2 98 %. Body mass index is 31.78 kg/m.  General: Cooperative, alert, well developed, in no acute distress. HEENT: Conjunctivae and lids unremarkable. Neck: No thyromegaly.  Cardiovascular: Regular rhythm.  Lungs: Normal work of breathing. Extremities: No edema.  Neurologic: No focal deficits.   Lab Results  Component Value Date   CREATININE 0.93 09/24/2019   BUN 14 09/24/2019   NA 141 09/24/2019   K 4.6 09/24/2019   CL 102 09/24/2019   CO2 24 09/24/2019   Lab Results  Component Value Date   ALT 16 09/24/2019   AST 15 09/24/2019   ALKPHOS 37 (L) 09/24/2019   BILITOT 0.4 09/24/2019   Lab Results  Component Value Date   HGBA1C 5.4 09/24/2019   HGBA1C 5.4 01/13/2019   HGBA1C 5.3 07/16/2018   HGBA1C 5.4 03/12/2018   HGBA1C 5.7 (H) 11/27/2017   Lab Results  Component Value Date   INSULIN 10.4 09/24/2019   INSULIN 8.2 01/13/2019   INSULIN 8.2 07/16/2018   INSULIN 6.7 03/12/2018   INSULIN 12.2 11/27/2017   Lab Results  Component Value Date   TSH 0.667 11/27/2017   Lab Results  Component Value Date   CHOL 217 (H) 09/24/2019   HDL 45 09/24/2019   LDLCALC 160 (H) 09/24/2019   TRIG 69 09/24/2019   Lab Results  Component Value Date   WBC 8.0 11/27/2017   HGB 13.6 11/27/2017   HCT 38.8 11/27/2017   MCV 86 11/27/2017   PLT  343 04/06/2015   No results found for: IRON, TIBC, FERRITIN   Ref. Range 01/13/2019 10:28  Vitamin D, 25-Hydroxy Latest Ref Range: 30.0 - 100.0 ng/mL 60.5    Attestation Statements:   Reviewed by clinician on day of visit: allergies, medications, problem list, medical history, surgical history, family history, social history, and previous encounter notes.  Corey Skains, am acting as Location manager for [employer], PA-C.  I have reviewed the above documentation for accuracy and completeness, and I agree with the above. Abby Potash, PA-C

## 2019-10-12 ENCOUNTER — Other Ambulatory Visit: Payer: Self-pay

## 2019-10-12 ENCOUNTER — Ambulatory Visit (INDEPENDENT_AMBULATORY_CARE_PROVIDER_SITE_OTHER): Payer: No Typology Code available for payment source | Admitting: Family Medicine

## 2019-10-14 ENCOUNTER — Other Ambulatory Visit: Payer: Self-pay

## 2019-10-14 ENCOUNTER — Ambulatory Visit (INDEPENDENT_AMBULATORY_CARE_PROVIDER_SITE_OTHER): Payer: No Typology Code available for payment source | Admitting: Family Medicine

## 2019-10-14 VITALS — BP 127/80 | HR 84 | Temp 98.2°F | Ht 65.0 in | Wt 193.0 lb

## 2019-10-14 DIAGNOSIS — R7303 Prediabetes: Secondary | ICD-10-CM | POA: Diagnosis not present

## 2019-10-14 DIAGNOSIS — E7849 Other hyperlipidemia: Secondary | ICD-10-CM | POA: Diagnosis not present

## 2019-10-14 DIAGNOSIS — E669 Obesity, unspecified: Secondary | ICD-10-CM | POA: Diagnosis not present

## 2019-10-14 DIAGNOSIS — Z6832 Body mass index (BMI) 32.0-32.9, adult: Secondary | ICD-10-CM

## 2019-10-15 NOTE — Progress Notes (Signed)
Chief Complaint:   OBESITY Robin ATTERBERRY is here to discuss her progress with her obesity treatment plan along with follow-up of her obesity related diagnoses. Robin Knapp is on the Category 3 Plan and states she is following her eating plan approximately 50% of the time. Robin Knapp states she is walking and doing high impact aerobics 45 to 60 minutes 5 times per week.  Today's visit was #: 48 Starting weight: 199 lbs Starting date: 11/27/2017 Today's weight: 193 lbs Today's date: 10/14/2019 Total lbs lost to date: 6 Total lbs lost since last in-office visit: 0  Interim History: Robin Knapp is still working on getting all of her calories and food in for the day. She is getting most of breakfast and lunch in, but she finds herself going without dinner or eating less at dinner.  Subjective:   Prediabetes Robin Knapp has a diagnosis of prediabetes based on her elevated Hgb A1c and was informed this puts her at greater risk of developing diabetes. She switched back to taking metformin every morning. She continues to work on diet and exercise to decrease her risk of diabetes. She has no hunger.  Lab Results  Component Value Date   HGBA1C 5.4 09/24/2019   Lab Results  Component Value Date   INSULIN 10.4 09/24/2019   INSULIN 8.2 01/13/2019   INSULIN 8.2 07/16/2018   INSULIN 6.7 03/12/2018   INSULIN 12.2 11/27/2017   Other hyperlipidemia Robin Knapp has hyperlipidemia and she has LDL of 160 and HDL of 45. She is not on statin. Robin Knapp has been trying to improve her cholesterol levels with intensive lifestyle modification including a low saturated fat diet, exercise and weight loss. She denies any chest pain.  Lab Results  Component Value Date   ALT 16 09/24/2019   AST 15 09/24/2019   ALKPHOS 37 (L) 09/24/2019   BILITOT 0.4 09/24/2019   Lab Results  Component Value Date   CHOL 217 (H) 09/24/2019   HDL 45 09/24/2019   LDLCALC 160 (H) 09/24/2019   TRIG 69 09/24/2019    Assessment/Plan:     Prediabetes Robin Knapp will continue to work on weight loss, exercise, and decreasing simple carbohydrates to help decrease the risk of diabetes. We will repeat labs in early May.  Other hyperlipidemia Cardiovascular risk and specific lipid/LDL goals reviewed.  We discussed several lifestyle modifications today and Robin Knapp will continue to work on diet, exercise and weight loss efforts. We will repeat labs in early April. Orders and follow up as documented in patient record.   Counseling Intensive lifestyle modifications are the first line treatment for this issue. . Dietary changes: Increase soluble fiber. Decrease simple carbohydrates. . Exercise changes: Moderate to vigorous-intensity aerobic activity 150 minutes per week if tolerated.  Class 1 obesity with serious comorbidity and body mass index (BMI) of 32.0 to 32.9 in adult, unspecified obesity type Robin Knapp is currently in the action stage of change. As such, her goal is to continue with weight loss efforts. She has agreed to the Category 3 Plan.   Exercise goals: Robin Knapp will continue her current exercise regimen.  Behavioral modification strategies: increasing lean protein intake, increasing vegetables, meal planning and cooking strategies, keeping healthy foods in the home and planning for success.  Robin Knapp has agreed to follow-up with our clinic in 2 weeks. She was informed of the importance of frequent follow-up visits to maximize her success with intensive lifestyle modifications for her multiple health conditions.   Objective:   Blood pressure 127/80, pulse 84, temperature  98.2 F (36.8 C), temperature source Oral, height 5\' 5"  (1.651 m), weight 193 lb (87.5 kg), last menstrual period 09/01/2019, SpO2 97 %. Body mass index is 32.12 kg/m.  General: Cooperative, alert, well developed, in no acute distress. HEENT: Conjunctivae and lids unremarkable. Cardiovascular: Regular rhythm.  Lungs: Normal work of  breathing. Neurologic: No focal deficits.   Lab Results  Component Value Date   CREATININE 0.93 09/24/2019   BUN 14 09/24/2019   NA 141 09/24/2019   K 4.6 09/24/2019   CL 102 09/24/2019   CO2 24 09/24/2019   Lab Results  Component Value Date   ALT 16 09/24/2019   AST 15 09/24/2019   ALKPHOS 37 (L) 09/24/2019   BILITOT 0.4 09/24/2019   Lab Results  Component Value Date   HGBA1C 5.4 09/24/2019   HGBA1C 5.4 01/13/2019   HGBA1C 5.3 07/16/2018   HGBA1C 5.4 03/12/2018   HGBA1C 5.7 (H) 11/27/2017   Lab Results  Component Value Date   INSULIN 10.4 09/24/2019   INSULIN 8.2 01/13/2019   INSULIN 8.2 07/16/2018   INSULIN 6.7 03/12/2018   INSULIN 12.2 11/27/2017   Lab Results  Component Value Date   TSH 0.667 11/27/2017   Lab Results  Component Value Date   CHOL 217 (H) 09/24/2019   HDL 45 09/24/2019   LDLCALC 160 (H) 09/24/2019   TRIG 69 09/24/2019   Lab Results  Component Value Date   WBC 8.0 11/27/2017   HGB 13.6 11/27/2017   HCT 38.8 11/27/2017   MCV 86 11/27/2017   PLT 343 04/06/2015   No results found for: IRON, TIBC, FERRITIN   Ref. Range 09/24/2019 09:59  Vitamin D, 25-Hydroxy Latest Ref Range: 30.0 - 100.0 ng/mL 56.1    Attestation Statements:   Reviewed by clinician on day of visit: allergies, medications, problem list, medical history, surgical history, family history, social history, and previous encounter notes.  Time spent on visit including pre-visit chart review and post-visit care was 22 minutes.   I, Doreene Nest, am acting as transcriptionist for Eber Jones, MD.  I have reviewed the above documentation for accuracy and completeness, and I agree with the above. Eber Jones, MD

## 2019-11-03 ENCOUNTER — Ambulatory Visit (INDEPENDENT_AMBULATORY_CARE_PROVIDER_SITE_OTHER): Payer: No Typology Code available for payment source | Admitting: Family Medicine

## 2019-11-06 ENCOUNTER — Ambulatory Visit: Payer: No Typology Code available for payment source | Admitting: Allergy

## 2019-12-02 ENCOUNTER — Encounter: Payer: Self-pay | Admitting: Allergy

## 2019-12-02 ENCOUNTER — Ambulatory Visit: Payer: No Typology Code available for payment source | Admitting: Allergy

## 2019-12-02 ENCOUNTER — Telehealth: Payer: Self-pay | Admitting: *Deleted

## 2019-12-02 ENCOUNTER — Other Ambulatory Visit: Payer: Self-pay

## 2019-12-02 VITALS — BP 130/82 | HR 92 | Temp 98.2°F | Resp 16 | Ht 65.0 in | Wt 197.8 lb

## 2019-12-02 DIAGNOSIS — L578 Other skin changes due to chronic exposure to nonionizing radiation: Secondary | ICD-10-CM | POA: Diagnosis not present

## 2019-12-02 DIAGNOSIS — J4530 Mild persistent asthma, uncomplicated: Secondary | ICD-10-CM | POA: Diagnosis not present

## 2019-12-02 DIAGNOSIS — L2489 Irritant contact dermatitis due to other agents: Secondary | ICD-10-CM | POA: Diagnosis not present

## 2019-12-02 DIAGNOSIS — J3089 Other allergic rhinitis: Secondary | ICD-10-CM

## 2019-12-02 MED ORDER — AZELASTINE-FLUTICASONE 137-50 MCG/ACT NA SUSP: 1.0000 | Freq: Two times a day (BID) | NASAL | 5 refills | Status: DC

## 2019-12-02 MED ORDER — ALBUTEROL SULFATE HFA 108 (90 BASE) MCG/ACT IN AERS: 2.0000 | INHALATION_SPRAY | RESPIRATORY_TRACT | 1 refills | Status: DC | PRN

## 2019-12-02 MED FILL — ALBUTEROL SULFATE HFA 108 (: 108 (90 BAS | 17 days supply | Qty: 18 | Fill #0

## 2019-12-02 NOTE — Progress Notes (Signed)
New Patient Note  RE: Robin Knapp MRN: [id number] DOB: 04-Jan-1971 Date of Office Visit: 12/02/2019  Referring provider: Vernie Shanks, MD Primary care provider: Vernie Shanks, MD  Chief Complaint: Asthma  History of present illness: Robin Knapp is a 49 y.o. female presenting today for consultation for asthma.  She was diagnosed with asthma in 2005.  She states she had gone on a trip to [place] and developed coughing episodes that kept on after she had returned from her vacation.  Due to this continued cough she saw Dr. Donneta Romberg with [facility].  She states she did have allergy testing and recalls being positive to pollens including grass and weeds as well as cats.  She states she did allergy shots for about 6 to 9 months but did not feel like she was noticing any improvements in the schedule and became inconvenient that she stopped. She states for the asthma she was on albuterol as needed use in was at the time using Advair Diskus.  She recalls later using Symbicort.  She also states she was on Singulair at one time and she ran out and just never had it refilled or restarted as did not feel that it was helping.  She has seen Dr. Ishmael Holter with our practice as well around 2016 or so.  Dr. Ishmael Holter longer works with our practice anymore.  She did have repeat testing at this time as well.  She states with her asthma she has not used albuterol in about 2 years now.  She does note that the triggers of her asthma include extremes of temperature including being in the cold or in sauna, smoke exposure, colds, perfumes. She states that she does note with these triggers of wheeze and cough.  She does endorse having symptoms of nasal congestion that is worse at night.  She states she was taking Allegra-D.  She is also used Afrin as well.  She states she is now taking Allegra and using Flonase 2 sprays daily.  She has tried nasal saline rinses and did not tolerate this.  She does know after  touching a cat she touched her face and developed eye swelling and itchy eyes.  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  She also reports having a contact allergy to use sunscreens as well as sun exposure.  She states she can develop a very red intensive burning type rash as well as hives with sun exposure.  She states every sounds when she has tried especially the chemical sunscreens cause this as well.  She states she did try making her own sunscreen before and it did work for her. She also reports that certain body products that she has tried has caused her to develop a rash as well.  Review of systems: Review of Systems  Constitutional: Negative.   HENT:       See HPI  Eyes: Negative.   Respiratory: Negative.   Cardiovascular: Negative.   Gastrointestinal: Negative.   Musculoskeletal: Negative.   Skin: Negative.   Neurological: Negative.     All other systems negative unless noted above in HPI  Past medical history: Past Medical History:  Diagnosis Date  . Allergy   . Asthma   . Breast lump   . Constipation   . Dry skin   . Fatigue   . GERD (gastroesophageal reflux disease)    pregnancy related-zantac  . HTN (hypertension)   .  Hyperlipidemia   . Infertility, female   . Joint pain   . Lactose intolerance   . Missed abortion    6weeks 3days  . Muscle pain   . Obesity   . Palpitations   . PCOS (polycystic ovarian syndrome)   . Seasonal allergies   . Skin rash   . Stress   . Vitamin D deficiency     Past surgical history: Past Surgical History:  Procedure Laterality Date  . BREAST BIOPSY    . CESAREAN SECTION    . DILATION AND EVACUATION  05/09/2012   Procedure: DILATATION AND EVACUATION;  Surgeon: Marvene Staff, MD;  Location: [facility];  Service: Gynecology;  Laterality: N/A;  . EYE SURGERY    . LASIK    . [place]    . WISDOM TOOTH EXTRACTION  1994    Family history:  Family History  Problem  Relation Age of Onset  . Hyperlipidemia Mother   . Hyperlipidemia Father   . Hypertension Father   . Anxiety disorder Father   . Hyperlipidemia Maternal Grandmother   . Hypertension Maternal Grandmother   . Heart disease Maternal Grandmother   . Heart disease Maternal Grandfather   . Diabetes Maternal Grandfather   . Stroke Paternal Grandmother   . Mental illness Paternal Grandmother   . Heart disease Paternal Grandmother     Social history: Lives in a home with carpeting with electric heating and central cooling.  There is a dog, domestic birds and fish in the home.  No concern for water damage, mildew or roaches in the home.  She denies a smoking history. Occupational History  . Occupation: Audiological scientist midwife  Tobacco Use  . Smoking status: Never Smoker  . Smokeless tobacco: Never Used   Medication List: Current Outpatient Medications  Medication Sig Dispense Refill  . acetaminophen (TYLENOL 8 HOUR ARTHRITIS PAIN) 650 MG CR tablet Take 650 mg by mouth every 8 (eight) hours as needed for pain.    [name] albuterol (VENTOLIN HFA) 108 (90 Base) MCG/ACT inhaler Inhale 2 puffs into the lungs every 4 (four) hours as needed for wheezing or shortness of breath. 18 g 1  . Cholecalciferol ([employer] VITAMIN D3) 10000 units CAPS Take by mouth.    . fexofenadine (ALLEGRA) 60 MG tablet Take 60 mg by mouth 2 (two) times daily.    . fluticasone (FLONASE) 50 MCG/ACT nasal spray Place into both nostrils daily.    [name] ibuprofen (ADVIL,MOTRIN) 600 MG tablet Take 600 mg by mouth every 6 (six) hours as needed.    [name] levonorgestrel (MIRENA) 20 MCG/24HR IUD 1 each by Intrauterine route once.    . Magnesium 250 MG TABS Take 1 tablet by mouth daily.    . metFORMIN (GLUCOPHAGE) 500 MG tablet Take 1 tablet (500 mg total) by mouth at bedtime. 90 tablet 0  . montelukast (SINGULAIR) 10 MG tablet Take 10 mg by mouth at bedtime.    . Azelastine-Fluticasone 137-50 MCG/ACT SUSP Place 1 spray into the nose in the morning  and at bedtime. 23 g 5   No current facility-administered medications for this visit.    Known medication allergies: Allergies  Allergen Reactions  . Sulfa Antibiotics Hives    itching  . Sunscreens      Physical examination: Blood pressure 130/82, pulse 92, temperature 98.2 F (36.8 C), temperature source Temporal, resp. rate 16, height 5\' 5"  (1.651 m), weight 197 lb 12.8 oz (89.7 kg), SpO2 97 %.  General: Alert, interactive, in no  acute distress. HEENT: PERRLA, TMs pearly gray, turbinates mildly edematous with clear discharge, post-pharynx non erythematous. Neck: Supple without lymphadenopathy. Lungs: Clear to auscultation without wheezing, rhonchi or rales. {no increased work of breathing. CV: Normal S1, S2 without murmurs. Abdomen: Nondistended, nontender. Skin: Warm and dry, without lesions or rashes. Extremities:  No clubbing, cyanosis or edema. Neuro:   Grossly intact.  Diagnositics/Labs:  Spirometry: FEV1: 2.33L 94%, FVC: 3.06L 100%, ratio consistent with nonobstructive pattern  Allergy testing: enviromental allergy skin prick testing is grass pollen, weed pollen, tree pollens, molds, dust mites,  Cat.   Allergy testing results were read and interpreted by provider, documented by clinical staff.   Assessment and plan:   Allergic rhinitis - environmental allergy skin testing today is positive to grass pollen, weed pollen, tree pollen, mold, dust mite, cat - allergen avoidance measures discussed/handouts provided - continue Allegra 180mg  daily as needed - recommend use of Dymista (or its generic) 1 spray each nostril twice a day.  This is a combination nasal spray with Flonase + Astelin (nasal antihistamine).  This helps with both nasal congestion and drainage.  - use nasal saline spray and blow nose well prior to use of medicated nasal sprays - allergen immunotherapy discussed today including protocol, benefits and risk.  Informational handout provided.  If  interested in this therapuetic option you can check with your insurance carrier for coverage.  Let us know if you would like to proceed with this option.    Mild persistent asthma - have access to albuterol inhaler 2 puffs every 4-6 hours as needed for cough/wheeze/shortness of breath/chest tightness.  May use 15-20 minutes prior to activity.   Monitor frequency of use.   - lung function testing looks great today!  Contact dermatitis/solar dermatitis - for known sun exposure recommend doubling your Allegra dose (2 tabs) and also take Pepcid 20-40mg  to help minimize your antihistamine response - can perform patch testing to TRUE test patch panel (see below) if wanting to determine what you are allergic to with skin contact.  If you still have body products that have causes dermatitis after use you can bring those in and patch to those as well.  Otherwise would avoid products that cause rash and see which ingredients are similar among the products.    Follow-up 6 months or sooner if needed  I appreciate the opportunity to take part in Robin Knapp's care. Please do not hesitate to contact me with questions.  Sincerely,   Prudy Feeler, MD Allergy/Immunology [facility]

## 2019-12-02 NOTE — Telephone Encounter (Signed)
PA has been submitted for Azelastine-Fluticasone through CoverMyMeds and is currently waiting for approval or denial.

## 2019-12-02 NOTE — Patient Instructions (Addendum)
-   environmental allergy skin testing today is positive to grass pollen, weed pollen, tree pollen, mold, dust mite, cat - allergen avoidance measures discussed/handouts provided - continue Allegra 180mg  daily as needed - recommend use of Dymista (or its generic) 1 spray each nostril twice a day.  This is a combination nasal spray with Flonase + Astelin (nasal antihistamine).  This helps with both nasal congestion and drainage.  - use nasal saline spray and blow nose well prior to use of medicated nasal sprays - allergen immunotherapy discussed today including protocol, benefits and risk.  Informational handout provided.  If interested in this therapuetic option you can check with your insurance carrier for coverage.  Let us know if you would like to proceed with this option.    - have access to albuterol inhaler 2 puffs every 4-6 hours as needed for cough/wheeze/shortness of breath/chest tightness.  May use 15-20 minutes prior to activity.   Monitor frequency of use.   - lung function testing looks great today!  - for known sun exposure recommend doubling your Allegra dose (2 tabs) and also take Pepcid 20-40mg  to help minimize your antihistamine response  - can perform patch testing to TRUE test patch panel (see below) if wanting to determine what you are allergic to with skin contact.  If you still have body products that have causes dermatitis after use you can bring those in and patch to those as well.  Otherwise would avoid products that cause rash and see which ingredients are similar among the products.    Follow-up 6 months or sooner if needed  True Test looks for the following sensitivities:

## 2019-12-03 ENCOUNTER — Telehealth: Payer: Self-pay

## 2019-12-03 NOTE — Telephone Encounter (Signed)
Patient called and stated her pharmacy will not fill the generic Dymista due to insurance coverage. Patient stated she has tried Nasacort in the past and Flonase most recently. PA will be required to see if we could get Dymista approve.

## 2019-12-04 MED FILL — AZELASTINE-FLUTICASONE 137-: 137-50 | 30 days supply | Qty: 23 | Fill #0

## 2019-12-04 NOTE — Telephone Encounter (Signed)
PA for azelastine-fluticasone has been approved. PA approval form has been faxed to patient's pharmacy, labeled, and placed in bulk scanning.

## 2019-12-16 ENCOUNTER — Ambulatory Visit (INDEPENDENT_AMBULATORY_CARE_PROVIDER_SITE_OTHER): Payer: No Typology Code available for payment source | Admitting: Physician Assistant

## 2019-12-16 ENCOUNTER — Other Ambulatory Visit: Payer: Self-pay

## 2019-12-16 ENCOUNTER — Encounter (INDEPENDENT_AMBULATORY_CARE_PROVIDER_SITE_OTHER): Payer: Self-pay | Admitting: Physician Assistant

## 2019-12-16 VITALS — BP 117/75 | HR 77 | Temp 98.8°F | Ht 65.0 in | Wt 191.0 lb

## 2019-12-16 DIAGNOSIS — E7849 Other hyperlipidemia: Secondary | ICD-10-CM

## 2019-12-16 DIAGNOSIS — Z9189 Other specified personal risk factors, not elsewhere classified: Secondary | ICD-10-CM

## 2019-12-16 DIAGNOSIS — R7303 Prediabetes: Secondary | ICD-10-CM

## 2019-12-16 DIAGNOSIS — E669 Obesity, unspecified: Secondary | ICD-10-CM

## 2019-12-16 DIAGNOSIS — Z6831 Body mass index (BMI) 31.0-31.9, adult: Secondary | ICD-10-CM

## 2019-12-16 MED ORDER — METFORMIN HCL 500 MG PO TABS: 500.0000 mg | ORAL_TABLET | Freq: Every day | ORAL | 0 refills | Status: DC

## 2019-12-16 MED FILL — metFORMIN HCL 500 MG TABS: 500 | 90 days supply | Qty: 90 | Fill #0

## 2019-12-16 NOTE — Progress Notes (Signed)
Chief Complaint:   OBESITY KEEYA THUNDER is here to discuss her progress with her obesity treatment plan along with follow-up of her obesity related diagnoses. [name] is on the Category 3 Plan and states she is following her eating plan approximately 50% of the time. Laporshia states she is doing dance aerobics 60 minutes 2-3 times per week.  Today's visit was #: 58 Starting weight: 199 lbs Starting date: 11/27/2017 Today's weight: 191 lbs Today's date: 12/16/2019 Total lbs lost to date: 8 Total lbs lost since last in-office visit: 2  Interim History: [name] states that she does well with breakfast and lunch, but doesn't feel like eating dinner, and so many times she skips it.  Subjective:   Prediabetes. [name] has a diagnosis of prediabetes based on her elevated HgA1c and was informed this puts her at greater risk of developing diabetes. She continues to work on diet and exercise to decrease her risk of diabetes. She denies nausea, vomiting, or diarrhea. [name] is on metformin.   Lab Results  Component Value Date   HGBA1C 5.4 09/24/2019   Lab Results  Component Value Date   INSULIN 10.4 09/24/2019   INSULIN 8.2 01/13/2019   INSULIN 8.2 07/16/2018   INSULIN 6.7 03/12/2018   INSULIN 12.2 11/27/2017   Other hyperlipidemia. Kashari is on no medications. No chest pain. She is exercising regularly.   Lab Results  Component Value Date   CHOL 217 (H) 09/24/2019   HDL 45 09/24/2019   LDLCALC 160 (H) 09/24/2019   TRIG 69 09/24/2019   Lab Results  Component Value Date   ALT 16 09/24/2019   AST 15 09/24/2019   ALKPHOS 37 (L) 09/24/2019   BILITOT 0.4 09/24/2019   The 10-year ASCVD risk score [name], et al., 2013) is: 1.4%   Values used to calculate the score:     Age: 40 years     Sex: Female     Is Non-Hispanic African American: Yes     Diabetic: No     Tobacco smoker: No     Systolic Blood Pressure: 123XX123 mmHg     Is BP treated: No     HDL Cholesterol: 45  mg/dL     Total Cholesterol: 217 mg/dL  At risk for diabetes mellitus. [name] is at higher than average risk for developing diabetes due to her obesity.   Assessment/Plan:   Prediabetes. [name] will continue to work on weight loss, exercise, and decreasing simple carbohydrates to help decrease the risk of diabetes. She was given a refill on her metFORMIN (GLUCOPHAGE) 500 MG tablet #30 with 0 refills.  Other hyperlipidemia. Cardiovascular risk and specific lipid/LDL goals reviewed.  We discussed several lifestyle modifications today and [name] will continue to work on diet, exercise and weight loss efforts. Orders and follow up as documented in patient record.   Counseling Intensive lifestyle modifications are the first line treatment for this issue. . Dietary changes: Increase soluble fiber. Decrease simple carbohydrates. . Exercise changes: Moderate to vigorous-intensity aerobic activity 150 minutes per week if tolerated. . Lipid-lowering medications: see documented in medical record.  At risk for diabetes mellitus. [name] was given approximately 15 minutes of diabetes education and counseling today. We discussed intensive lifestyle modifications today with an emphasis on weight loss as well as increasing exercise and decreasing simple carbohydrates in her diet. We also reviewed medication options with an emphasis on risk versus benefit of those discussed.   Repetitive spaced learning was employed today to  elicit superior memory formation and behavioral change.  Class 1 obesity with serious comorbidity and body mass index (BMI) of 31.0 to 31.9 in adult, unspecified obesity type.  [name] is currently in the action stage of change. As such, her goal is to continue with weight loss efforts. She has agreed to the Category 2 Plan + 200 protein calories.   Exercise goals: For substantial health benefits, adults should do at least 150 minutes (2 hours and 30 minutes) a week of  moderate-intensity, or 75 minutes (1 hour and 15 minutes) a week of vigorous-intensity aerobic physical activity, or an equivalent combination of moderate- and vigorous-intensity aerobic activity. Aerobic activity should be performed in episodes of at least 10 minutes, and preferably, it should be spread throughout the week.  Behavioral modification strategies: increasing lean protein intake and no skipping meals.  [name] has agreed to follow-up with our clinic in 3 weeks. She was informed of the importance of frequent follow-up visits to maximize her success with intensive lifestyle modifications for her multiple health conditions.   Objective:   Blood pressure 117/75, pulse 77, temperature 98.8 F (37.1 C), temperature source Oral, height 5\' 5"  (1.651 m), weight 191 lb (86.6 kg), SpO2 98 %. Body mass index is 31.78 kg/m.  General: Cooperative, alert, well developed, in no acute distress. HEENT: Conjunctivae and lids unremarkable. Cardiovascular: Regular rhythm.  Lungs: Normal work of breathing. Neurologic: No focal deficits.   Lab Results  Component Value Date   CREATININE 0.93 09/24/2019   BUN 14 09/24/2019   NA 141 09/24/2019   K 4.6 09/24/2019   CL 102 09/24/2019   CO2 24 09/24/2019   Lab Results  Component Value Date   ALT 16 09/24/2019   AST 15 09/24/2019   ALKPHOS 37 (L) 09/24/2019   BILITOT 0.4 09/24/2019   Lab Results  Component Value Date   HGBA1C 5.4 09/24/2019   HGBA1C 5.4 01/13/2019   HGBA1C 5.3 07/16/2018   HGBA1C 5.4 03/12/2018   HGBA1C 5.7 (H) 11/27/2017   Lab Results  Component Value Date   INSULIN 10.4 09/24/2019   INSULIN 8.2 01/13/2019   INSULIN 8.2 07/16/2018   INSULIN 6.7 03/12/2018   INSULIN 12.2 11/27/2017   Lab Results  Component Value Date   TSH 0.667 11/27/2017   Lab Results  Component Value Date   CHOL 217 (H) 09/24/2019   HDL 45 09/24/2019   LDLCALC 160 (H) 09/24/2019   TRIG 69 09/24/2019   Lab Results  Component Value Date    WBC 8.0 11/27/2017   HGB 13.6 11/27/2017   HCT 38.8 11/27/2017   MCV 86 11/27/2017   PLT 343 04/06/2015   No results found for: IRON, TIBC, FERRITIN  Attestation Statements:   Reviewed by clinician on day of visit: allergies, medications, problem list, medical history, surgical history, family history, social history, and previous encounter notes.  IMichaelene Song, am acting as transcriptionist for Abby Potash, PA-C   I have reviewed the above documentation for accuracy and completeness, and I agree with the above. Abby Potash, PA-C

## 2020-01-06 ENCOUNTER — Other Ambulatory Visit: Payer: Self-pay

## 2020-01-06 ENCOUNTER — Ambulatory Visit (INDEPENDENT_AMBULATORY_CARE_PROVIDER_SITE_OTHER): Payer: No Typology Code available for payment source | Admitting: Physician Assistant

## 2020-01-06 ENCOUNTER — Encounter (INDEPENDENT_AMBULATORY_CARE_PROVIDER_SITE_OTHER): Payer: Self-pay | Admitting: Physician Assistant

## 2020-01-06 VITALS — BP 143/84 | HR 90 | Temp 98.3°F | Ht 65.0 in | Wt 190.0 lb

## 2020-01-06 DIAGNOSIS — E559 Vitamin D deficiency, unspecified: Secondary | ICD-10-CM | POA: Diagnosis not present

## 2020-01-06 DIAGNOSIS — Z6831 Body mass index (BMI) 31.0-31.9, adult: Secondary | ICD-10-CM | POA: Diagnosis not present

## 2020-01-06 DIAGNOSIS — E669 Obesity, unspecified: Secondary | ICD-10-CM

## 2020-01-06 NOTE — Progress Notes (Signed)
Chief Complaint:   OBESITY Robin Knapp is here to discuss her progress with her obesity treatment plan along with follow-up of her obesity related diagnoses. Robin Knapp is on the Category 2 Plan + 200 calories and states she is following her eating plan approximately 80% of the time. Daksha states she is doing HIIT 90 minutes 3 times per week.  Today's visit was #: 39 Starting weight: 199 lbs Starting date: 11/27/2017 Today's weight: 190 lbs Today's date: 01/06/2020 Total lbs lost to date: 9 Total lbs lost since last in-office visit: 1  Interim History: Everlene states that she has done better with her plan since she has been eating more at lunch. She is still not consistently eating her afternoon snack or dinner.  Subjective:   Vitamin D deficiency. Rollita is on Vitamin D. No nausea, vomiting, or muscle weakness. Last Vitamin D 56.1 on 09/24/2019.  Assessment/Plan:   Vitamin D deficiency. Low Vitamin D level contributes to fatigue and are associated with obesity, breast, and colon cancer. She agrees to continue to take Vitamin D and will follow-up for routine testing of Vitamin D, at least 2-3 times per year to avoid over-replacement.  Class 1 obesity with serious comorbidity and body mass index (BMI) of 31.0 to 31.9 in adult, unspecified obesity type.  Khya is currently in the action stage of change. As such, her goal is to continue with weight loss efforts. She has agreed to the Category 2 Plan + 200 calories.   Exercise goals: For substantial health benefits, adults should do at least 150 minutes (2 hours and 30 minutes) a week of moderate-intensity, or 75 minutes (1 hour and 15 minutes) a week of vigorous-intensity aerobic physical activity, or an equivalent combination of moderate- and vigorous-intensity aerobic activity. Aerobic activity should be performed in episodes of at least 10 minutes, and preferably, it should be spread throughout the week.  Behavioral  modification strategies: no skipping meals, meal planning and cooking strategies and keeping healthy foods in the home.  Robin Knapp has agreed to follow-up with our clinic in 3 weeks. She was informed of the importance of frequent follow-up visits to maximize her success with intensive lifestyle modifications for her multiple health conditions.   Objective:   Blood pressure (!) 143/84, pulse 90, temperature 98.3 F (36.8 C), temperature source Oral, height 5\' 5"  (1.651 m), weight 190 lb (86.2 kg), SpO2 97 %. Body mass index is 31.62 kg/m.  General: Cooperative, alert, well developed, in no acute distress. HEENT: Conjunctivae and lids unremarkable. Cardiovascular: Regular rhythm.  Lungs: Normal work of breathing. Neurologic: No focal deficits.   Lab Results  Component Value Date   CREATININE 0.93 09/24/2019   BUN 14 09/24/2019   NA 141 09/24/2019   K 4.6 09/24/2019   CL 102 09/24/2019   CO2 24 09/24/2019   Lab Results  Component Value Date   ALT 16 09/24/2019   AST 15 09/24/2019   ALKPHOS 37 (L) 09/24/2019   BILITOT 0.4 09/24/2019   Lab Results  Component Value Date   HGBA1C 5.4 09/24/2019   HGBA1C 5.4 01/13/2019   HGBA1C 5.3 07/16/2018   HGBA1C 5.4 03/12/2018   HGBA1C 5.7 (H) 11/27/2017   Lab Results  Component Value Date   INSULIN 10.4 09/24/2019   INSULIN 8.2 01/13/2019   INSULIN 8.2 07/16/2018   INSULIN 6.7 03/12/2018   INSULIN 12.2 11/27/2017   Lab Results  Component Value Date   TSH 0.667 11/27/2017   Lab Results  Component Value Date   CHOL 217 (H) 09/24/2019   HDL 45 09/24/2019   LDLCALC 160 (H) 09/24/2019   TRIG 69 09/24/2019   Lab Results  Component Value Date   WBC 8.0 11/27/2017   HGB 13.6 11/27/2017   HCT 38.8 11/27/2017   MCV 86 11/27/2017   PLT 343 04/06/2015   No results found for: IRON, TIBC, FERRITIN  Attestation Statements:   Reviewed by clinician on day of visit: allergies, medications, problem list, medical history, surgical  history, family history, social history, and previous encounter notes.  Time spent on visit including pre-visit chart review and post-visit charting and care was 34 minutes.   IMichaelene Song, am acting as transcriptionist for Abby Potash, PA-C   I have reviewed the above documentation for accuracy and completeness, and I agree with the above. Abby Potash, PA-C

## 2020-01-25 ENCOUNTER — Encounter (INDEPENDENT_AMBULATORY_CARE_PROVIDER_SITE_OTHER): Payer: Self-pay | Admitting: Physician Assistant

## 2020-01-25 ENCOUNTER — Other Ambulatory Visit: Payer: Self-pay

## 2020-01-25 ENCOUNTER — Ambulatory Visit (INDEPENDENT_AMBULATORY_CARE_PROVIDER_SITE_OTHER): Payer: No Typology Code available for payment source | Admitting: Physician Assistant

## 2020-01-25 VITALS — BP 132/79 | HR 92 | Temp 98.5°F | Ht 65.0 in | Wt 193.0 lb

## 2020-01-25 DIAGNOSIS — R7303 Prediabetes: Secondary | ICD-10-CM | POA: Diagnosis not present

## 2020-01-25 DIAGNOSIS — Z6832 Body mass index (BMI) 32.0-32.9, adult: Secondary | ICD-10-CM | POA: Diagnosis not present

## 2020-01-25 DIAGNOSIS — E669 Obesity, unspecified: Secondary | ICD-10-CM | POA: Diagnosis not present

## 2020-01-26 ENCOUNTER — Encounter (INDEPENDENT_AMBULATORY_CARE_PROVIDER_SITE_OTHER): Payer: Self-pay | Admitting: Physician Assistant

## 2020-01-26 DIAGNOSIS — E119 Type 2 diabetes mellitus without complications: Secondary | ICD-10-CM

## 2020-01-26 MED ORDER — METFORMIN HCL ER 500 MG PO TB24: 500.0000 mg | ORAL_TABLET | Freq: Every day | ORAL | 0 refills | Status: DC

## 2020-01-26 NOTE — Telephone Encounter (Signed)
Please advise 

## 2020-01-26 NOTE — Telephone Encounter (Signed)
You can change to 500mg  ER once daily #30 0RF. Thanks

## 2020-01-26 NOTE — Progress Notes (Signed)
Chief Complaint:   OBESITY Robin Knapp is here to discuss her progress with her obesity treatment plan along with follow-up of her obesity related diagnoses. Robin Knapp is on the Category 2 Plan + 200 calories and states she is following her eating plan approximately 80% of the time. Robin Knapp states she is doing HIIT 90 minutes 2 times per week.  Today's visit was #: 44 Starting weight: 199 lbs Starting date: 11/27/2017 Today's weight: 193 lbs Today's date: 01/25/2020 Total lbs lost to date: 6 Total lbs lost since last in-office visit: 0  Interim History: Robin Knapp is hungry in the morning, but is not hungry for dinner. She sometimes skips lunch, but is doing so less often, depending on her work schedule.  Subjective:   Prediabetes. Robin Knapp has a diagnosis of prediabetes based on her elevated HgA1c and was informed this puts her at greater risk of developing diabetes. She continues to work on diet and exercise to decrease her risk of diabetes. She denies nausea, vomiting, or diarrhea. No polyphagia. Robin Knapp is on metformin.  Lab Results  Component Value Date   HGBA1C 5.4 09/24/2019   Lab Results  Component Value Date   INSULIN 10.4 09/24/2019   INSULIN 8.2 01/13/2019   INSULIN 8.2 07/16/2018   INSULIN 6.7 03/12/2018   INSULIN 12.2 11/27/2017   Assessment/Plan:   Prediabetes. Robin Knapp will continue to work on weight loss, exercise, and decreasing simple carbohydrates to help decrease the risk of diabetes. She will continue her medication as directed.  Class 1 obesity with serious comorbidity and body mass index (BMI) of 32.0 to 32.9 in adult, unspecified obesity type.  Robin Knapp is currently in the action stage of change. As such, her goal is to continue with weight loss efforts. She has agreed to the Category 2 Plan + 200 protein calories.   Exercise goals: For substantial health benefits, adults should do at least 150 minutes (2 hours and 30 minutes) a week of  moderate-intensity, or 75 minutes (1 hour and 15 minutes) a week of vigorous-intensity aerobic physical activity, or an equivalent combination of moderate- and vigorous-intensity aerobic activity. Aerobic activity should be performed in episodes of at least 10 minutes, and preferably, it should be spread throughout the week.  Behavioral modification strategies: increasing lean protein intake and no skipping meals.  Robin Knapp has agreed to follow-up with our clinic in 3 weeks. She was informed of the importance of frequent follow-up visits to maximize her success with intensive lifestyle modifications for her multiple health conditions.   Objective:   Blood pressure 132/79, pulse 92, temperature 98.5 F (36.9 C), temperature source Oral, height 5\' 5"  (1.651 m), weight 193 lb (87.5 kg), SpO2 97 %. Body mass index is 32.12 kg/m.  General: Cooperative, alert, well developed, in no acute distress. HEENT: Conjunctivae and lids unremarkable. Cardiovascular: Regular rhythm.  Lungs: Normal work of breathing. Neurologic: No focal deficits.   Lab Results  Component Value Date   CREATININE 0.93 09/24/2019   BUN 14 09/24/2019   NA 141 09/24/2019   K 4.6 09/24/2019   CL 102 09/24/2019   CO2 24 09/24/2019   Lab Results  Component Value Date   ALT 16 09/24/2019   AST 15 09/24/2019   ALKPHOS 37 (L) 09/24/2019   BILITOT 0.4 09/24/2019   Lab Results  Component Value Date   HGBA1C 5.4 09/24/2019   HGBA1C 5.4 01/13/2019   HGBA1C 5.3 07/16/2018   HGBA1C 5.4 03/12/2018   HGBA1C 5.7 (H) 11/27/2017  Lab Results  Component Value Date   INSULIN 10.4 09/24/2019   INSULIN 8.2 01/13/2019   INSULIN 8.2 07/16/2018   INSULIN 6.7 03/12/2018   INSULIN 12.2 11/27/2017   Lab Results  Component Value Date   TSH 0.667 11/27/2017   Lab Results  Component Value Date   CHOL 217 (H) 09/24/2019   HDL 45 09/24/2019   LDLCALC 160 (H) 09/24/2019   TRIG 69 09/24/2019   Lab Results  Component Value Date    WBC 8.0 11/27/2017   HGB 13.6 11/27/2017   HCT 38.8 11/27/2017   MCV 86 11/27/2017   PLT 343 04/06/2015   No results found for: IRON, TIBC, FERRITIN  Attestation Statements:   Reviewed by clinician on day of visit: allergies, medications, problem list, medical history, surgical history, family history, social history, and previous encounter notes.  Time spent on visit including pre-visit chart review and post-visit charting and care was 30 minutes.   Robin Knapp, am acting as transcriptionist for Abby Potash, PA-C   I have reviewed the above documentation for accuracy and completeness, and I agree with the above. Abby Potash, PA-C

## 2020-01-26 NOTE — Telephone Encounter (Signed)
Done

## 2020-02-01 MED FILL — NYSTATIN-TRIAMCINOLONE CRM: 100000-0.1 | 10 days supply | Qty: 45 | Fill #0

## 2020-02-17 ENCOUNTER — Ambulatory Visit (INDEPENDENT_AMBULATORY_CARE_PROVIDER_SITE_OTHER): Payer: No Typology Code available for payment source | Admitting: Physician Assistant

## 2020-02-17 ENCOUNTER — Encounter (INDEPENDENT_AMBULATORY_CARE_PROVIDER_SITE_OTHER): Payer: Self-pay | Admitting: Physician Assistant

## 2020-02-17 ENCOUNTER — Other Ambulatory Visit: Payer: Self-pay

## 2020-02-17 VITALS — BP 137/85 | HR 86 | Temp 98.4°F | Ht 65.0 in | Wt 197.0 lb

## 2020-02-17 DIAGNOSIS — E669 Obesity, unspecified: Secondary | ICD-10-CM | POA: Diagnosis not present

## 2020-02-17 DIAGNOSIS — Z6832 Body mass index (BMI) 32.0-32.9, adult: Secondary | ICD-10-CM

## 2020-02-17 DIAGNOSIS — E559 Vitamin D deficiency, unspecified: Secondary | ICD-10-CM

## 2020-02-17 NOTE — Progress Notes (Signed)
Chief Complaint:   OBESITY Robin Knapp is here to discuss her progress with her obesity treatment plan along with follow-up of her obesity related diagnoses. Robin Knapp is on the Category 2 Plan (modified) and states she is following her eating plan approximately 80% of the time. Robin Knapp states she is doing cardio 60 minutes 2 times per week.  Today's visit was #: 58 Starting weight: 199 lbs Starting date: 11/27/2017 Today's weight: 197 lbs Today's date: 02/17/2020 Total lbs lost to date: 2 Total lbs lost since last in-office visit: 0  Interim History: Robin Knapp reports that she "went off the rails this time." She got off track and had difficulty getting back on plan.  Subjective:   Vitamin D deficiency. Robin Knapp is on OTC Vitamin D. No nausea, vomiting, or muscle weakness. Last Vitamin D was 56.1 on 09/24/2019.  Assessment/Plan:   Vitamin D deficiency. Low Vitamin D level contributes to fatigue and are associated with obesity, breast, and colon cancer. She agrees to continue to take Vitamin D and will follow-up for routine testing of Vitamin D, at least 2-3 times per year to avoid over-replacement.  Class 1 obesity with serious comorbidity and body mass index (BMI) of 32.0 to 32.9 in adult, unspecified obesity type.  Robin Knapp is currently in the action stage of change. As such, her goal is to continue with weight loss efforts. She has agreed to the Category 2 Plan (modified).   Exercise goals: For substantial health benefits, adults should do at least 150 minutes (2 hours and 30 minutes) a week of moderate-intensity, or 75 minutes (1 hour and 15 minutes) a week of vigorous-intensity aerobic physical activity, or an equivalent combination of moderate- and vigorous-intensity aerobic activity. Aerobic activity should be performed in episodes of at least 10 minutes, and preferably, it should be spread throughout the week.  Behavioral modification strategies: meal planning and cooking  strategies and keeping healthy foods in the home.  Robin Knapp has agreed to follow-up with our clinic in 3 weeks. She was informed of the importance of frequent follow-up visits to maximize her success with intensive lifestyle modifications for her multiple health conditions.   Objective:   Blood pressure 137/85, pulse 86, temperature 98.4 F (36.9 C), temperature source Oral, height 5\' 5"  (1.651 m), weight 197 lb (89.4 kg), SpO2 97 %. Body mass index is 32.78 kg/m.  General: Cooperative, alert, well developed, in no acute distress. HEENT: Conjunctivae and lids unremarkable. Cardiovascular: Regular rhythm.  Lungs: Normal work of breathing. Neurologic: No focal deficits.   Lab Results  Component Value Date   CREATININE 0.93 09/24/2019   BUN 14 09/24/2019   NA 141 09/24/2019   K 4.6 09/24/2019   CL 102 09/24/2019   CO2 24 09/24/2019   Lab Results  Component Value Date   ALT 16 09/24/2019   AST 15 09/24/2019   ALKPHOS 37 (L) 09/24/2019   BILITOT 0.4 09/24/2019   Lab Results  Component Value Date   HGBA1C 5.4 09/24/2019   HGBA1C 5.4 01/13/2019   HGBA1C 5.3 07/16/2018   HGBA1C 5.4 03/12/2018   HGBA1C 5.7 (H) 11/27/2017   Lab Results  Component Value Date   INSULIN 10.4 09/24/2019   INSULIN 8.2 01/13/2019   INSULIN 8.2 07/16/2018   INSULIN 6.7 03/12/2018   INSULIN 12.2 11/27/2017   Lab Results  Component Value Date   TSH 0.667 11/27/2017   Lab Results  Component Value Date   CHOL 217 (H) 09/24/2019   HDL 45 09/24/2019  LDLCALC 160 (H) 09/24/2019   TRIG 69 09/24/2019   Lab Results  Component Value Date   WBC 8.0 11/27/2017   HGB 13.6 11/27/2017   HCT 38.8 11/27/2017   MCV 86 11/27/2017   PLT 343 04/06/2015   No results found for: IRON, TIBC, FERRITIN  Attestation Statements:   Reviewed by clinician on day of visit: allergies, medications, problem list, medical history, surgical history, family history, social history, and previous encounter  notes.  Time spent on visit including pre-visit chart review and post-visit charting and care was 30 minutes.   IMichaelene Song, am acting as transcriptionist for Abby Potash, PA-C   I have reviewed the above documentation for accuracy and completeness, and I agree with the above. Abby Potash, PA-C

## 2020-03-08 ENCOUNTER — Encounter (INDEPENDENT_AMBULATORY_CARE_PROVIDER_SITE_OTHER): Payer: Self-pay | Admitting: Physician Assistant

## 2020-03-08 ENCOUNTER — Ambulatory Visit: Payer: No Typology Code available for payment source | Admitting: Orthopaedic Surgery

## 2020-03-08 ENCOUNTER — Ambulatory Visit: Payer: Self-pay

## 2020-03-08 ENCOUNTER — Ambulatory Visit (INDEPENDENT_AMBULATORY_CARE_PROVIDER_SITE_OTHER): Payer: No Typology Code available for payment source | Admitting: Physician Assistant

## 2020-03-08 ENCOUNTER — Ambulatory Visit (INDEPENDENT_AMBULATORY_CARE_PROVIDER_SITE_OTHER): Payer: No Typology Code available for payment source

## 2020-03-08 ENCOUNTER — Encounter: Payer: Self-pay | Admitting: Orthopaedic Surgery

## 2020-03-08 ENCOUNTER — Other Ambulatory Visit: Payer: Self-pay

## 2020-03-08 VITALS — BP 126/79 | HR 74 | Temp 98.6°F | Ht 65.0 in | Wt 196.0 lb

## 2020-03-08 DIAGNOSIS — M25561 Pain in right knee: Secondary | ICD-10-CM | POA: Diagnosis not present

## 2020-03-08 DIAGNOSIS — E669 Obesity, unspecified: Secondary | ICD-10-CM

## 2020-03-08 DIAGNOSIS — E559 Vitamin D deficiency, unspecified: Secondary | ICD-10-CM

## 2020-03-08 DIAGNOSIS — M25511 Pain in right shoulder: Secondary | ICD-10-CM

## 2020-03-08 DIAGNOSIS — M25562 Pain in left knee: Secondary | ICD-10-CM

## 2020-03-08 DIAGNOSIS — Z6832 Body mass index (BMI) 32.0-32.9, adult: Secondary | ICD-10-CM

## 2020-03-08 DIAGNOSIS — Z9189 Other specified personal risk factors, not elsewhere classified: Secondary | ICD-10-CM

## 2020-03-08 DIAGNOSIS — G8929 Other chronic pain: Secondary | ICD-10-CM

## 2020-03-08 DIAGNOSIS — R7303 Prediabetes: Secondary | ICD-10-CM

## 2020-03-08 MED ORDER — METFORMIN HCL ER 500 MG PO TB24: 500.0000 mg | ORAL_TABLET | Freq: Every day | ORAL | 0 refills | Status: DC

## 2020-03-08 MED FILL — METFORMIN HCL ER 500 MG TB2: 500 | 30 days supply | Qty: 30 | Fill #0

## 2020-03-08 NOTE — Progress Notes (Signed)
Office Visit Note   Patient: Robin Knapp           Date of Birth: 08/26/1971           MRN: [id number] Visit Date: 03/08/2020              Requested by: Vernie Shanks, MD [place],  [place] [id number] PCP: Vernie Shanks, MD   Assessment & Plan: Visit Diagnoses:  1. Chronic pain of both knees   2. 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  For the right shoulder we referred her to Dr. Junius Roads for ultrasound guided injection of the [facility] joint.  For the knees I recommended weight loss, strengthening, physical therapy and modalities.  Follow-up as needed.  Follow-Up Instructions: Return if symptoms worsen or fail to improve.   Orders:  Orders Placed This Encounter  Procedures  . XR KNEE 3 VIEW LEFT  . XR KNEE 3 VIEW RIGHT  . XR Shoulder Right  . US Guided Needle Placement - No Linked Charges  . Ambulatory referral to Physical Therapy   No orders of the defined types were placed in this encounter.     Procedures: No procedures performed   Clinical Data: No additional findings.   Subjective: Chief Complaint  Patient presents with  . Right Shoulder - Pain  . Left Knee - Pain  . Right Knee - Pain    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  With right shoulder she denies any injuries.  She has pain if she abducts her arm to 90 degrees right over the [facility] joint.  The pain is worse at night.  She has no problems reaching above her head.  For both her knees she endorses anterior knee pain with cracking and she has trouble kneeling and squatting and stairs.  She has been told to lose weight by her PCP.  She has not done any physical therapy.  Denies any swelling or mechanical symptoms.   Review of Systems  Constitutional: Negative.   HENT: Negative.   Eyes:  Negative.   Respiratory: Negative.   Cardiovascular: Negative.   Endocrine: Negative.   Musculoskeletal: Negative.   Neurological: Negative.   Hematological: Negative.   Psychiatric/Behavioral: Negative.   All other systems reviewed and are negative.    Objective: Vital Signs: There were no vitals taken for this visit.  Physical Exam Vitals and nursing note reviewed.  Constitutional:      Appearance: She is well-developed.  HENT:     Head: Normocephalic and atraumatic.  Pulmonary:     Effort: Pulmonary effort is normal.  Abdominal:     Palpations: Abdomen is soft.  Musculoskeletal:     Cervical back: Neck supple.  Skin:    General: Skin is warm.     Capillary Refill: Capillary refill takes less than 2 seconds.  Neurological:     Mental Status: She is alert and oriented to person, place, and time.  Psychiatric:        Behavior: Behavior normal.        Thought Content: Thought content normal.        Judgment: Judgment normal.     Ortho Exam Right shoulder shows full range of motion.  Mildly positive cross body adduction.  AC joint is slightly  tender.  Rotator cuff is normal to manual muscle testing.  Mildly positive Hawkins sign. Bilateral knees show no joint effusion.  1+ patellofemoral crepitus.  Collaterals and cruciates are stable.  Normal range of motion. Specialty Comments:  No specialty comments available.  Imaging: US Guided Needle Placement - No Linked Charges  Result Date: 03/08/2020 Ultrasound-guided right AC joint injection: After sterile prep with Betadine, injected 3 cc 1% lidocaine without epinephrine and 40 mg methylprednisolone using out of plane approach into the [facility] joint.  Injectate was seen filling the joint capsule.  She had good immediate relief.    PMFS History: Patient Active Problem List   Diagnosis Date Noted  . Bilateral carpal tunnel syndrome 01/17/2018  . Other fatigue 11/27/2017  . Shortness of breath on exertion 11/27/2017  .  Vitamin D deficiency 11/27/2017  . PCOS (polycystic ovarian syndrome) 11/27/2017  . Rash and nonspecific skin eruption 03/27/2015  . Facial swelling 03/27/2015  . Swelling of both lips 03/27/2015   Past Medical History:  Diagnosis Date  . Allergy   . Asthma   . Breast lump   . Constipation   . Dry skin   . Fatigue   . GERD (gastroesophageal reflux disease)    pregnancy related-zantac  . HTN (hypertension)   . Hyperlipidemia   . Infertility, female   . Joint pain   . Lactose intolerance   . Missed abortion    6weeks 3days  . Muscle pain   . Obesity   . Palpitations   . PCOS (polycystic ovarian syndrome)   . Seasonal allergies   . Skin rash   . Stress   . Vitamin D deficiency     Family History  Problem Relation Age of Onset  . Hyperlipidemia Mother   . Hyperlipidemia Father   . Hypertension Father   . Anxiety disorder Father   . Hyperlipidemia Maternal Grandmother   . Hypertension Maternal Grandmother   . Heart disease Maternal Grandmother   . Heart disease Maternal Grandfather   . Diabetes Maternal Grandfather   . Stroke Paternal Grandmother   . Mental illness Paternal Grandmother   . Heart disease Paternal Grandmother     Past Surgical History:  Procedure Laterality Date  . BREAST BIOPSY    . CESAREAN SECTION    . DILATION AND EVACUATION  05/09/2012   Procedure: DILATATION AND EVACUATION;  Surgeon: Marvene Staff, MD;  Location: [facility];  Service: Gynecology;  Laterality: N/A;  . EYE SURGERY    . LASIK    . [place] EXTRACTION  1994   Social History   Occupational History  . Occupation: Audiological scientist midwife  Tobacco Use  . Smoking status: Never Smoker  . Smokeless tobacco: Never Used  Substance and Sexual Activity  . Alcohol use: No    Alcohol/week: 0.0 standard drinks  . Drug use: No  . Sexual activity: Not on file

## 2020-03-08 NOTE — Progress Notes (Signed)
Subjective: She is here for ultrasound-guided right AC joint injection.  Objective: She is tender to palpation over the [facility] joint.  AC crossover test does not hurt, but she does have some pain when reaching laterally.  Procedure:  Ultrasound-guided right AC joint injection: After sterile prep with Betadine, injected 3 cc 1% lidocaine without epinephrine and 40 mg methylprednisolone using out of plane approach into the [facility] joint.  Injectate was seen filling the joint capsule.  She had good immediate relief.  She will follow-up as scheduled.

## 2020-03-09 NOTE — Progress Notes (Signed)
Chief Complaint:   OBESITY Robin Knapp is here to discuss her progress with her obesity treatment plan along with follow-up of her obesity related diagnoses. Robin Knapp is on the Category 2 Plan modified and states she is following her eating plan approximately 80% of the time. Robin Knapp states she is doing cardio for 30-60 minutes 3-4 times per week.  Today's visit was #: 30 Starting weight: 199 lbs Starting date: 11/27/2017 Today's weight: 196 lbs Today's date: 03/08/2020 Total lbs lost to date: 3 lbs Total lbs lost since last in-office visit: 1 lb  Interim History: Robin Knapp continues to struggle with eating dinner due to lack of hunger.  Subjective:   1. Prediabetes Robin Knapp has a diagnosis of prediabetes based on her elevated HgA1c and was informed this puts her at greater risk of developing diabetes. She continues to work on diet and exercise to decrease her risk of diabetes. She denies hypoglycemia.  She is taking metformin with no nausea, vomiting, diarrhea, or polyphagia.  Lab Results  Component Value Date   HGBA1C 5.4 09/24/2019   Lab Results  Component Value Date   INSULIN 10.4 09/24/2019   INSULIN 8.2 01/13/2019   INSULIN 8.2 07/16/2018   INSULIN 6.7 03/12/2018   INSULIN 12.2 11/27/2017   2. Vitamin D deficiency Robin Knapp's Vitamin D level was 56.1 on 09/24/2019. She is currently taking OTC vitamin D. She denies nausea, vomiting or muscle weakness.  3. At risk for diabetes mellitus Robin Knapp is at higher than average risk for developing diabetes due to her obesity.   Assessment/Plan:   1. Prediabetes Robin Knapp will continue to work on weight loss, exercise, and decreasing simple carbohydrates to help decrease the risk of diabetes.  - metFORMIN (GLUCOPHAGE-XR) 500 MG 24 hr tablet; Take 1 tablet (500 mg total) by mouth daily with breakfast.  Dispense: 30 tablet; Refill: 0  2. Vitamin D deficiency Low Vitamin D level contributes to fatigue and are associated with obesity,  breast, and colon cancer. She agrees to continue to take OTC Vitamin D and will follow-up for routine testing of Vitamin D, at least 2-3 times per year to avoid over-replacement.  3. At risk for diabetes mellitus Robin Knapp was given approximately 15 minutes of diabetes education and counseling today. We discussed intensive lifestyle modifications today with an emphasis on weight loss as well as increasing exercise and decreasing simple carbohydrates in her diet. We also reviewed medication options with an emphasis on risk versus benefit of those discussed.   Repetitive spaced learning was employed today to elicit superior memory formation and behavioral change.  4. Class 1 obesity with serious comorbidity and body mass index (BMI) of 32.0 to 32.9 in adult, unspecified obesity type Robin Knapp is currently in the action stage of change. As such, her goal is to continue with weight loss efforts. She has agreed to the Category 2 Plan modified.  Add protein shakes daily.   Exercise goals: As is.  Behavioral modification strategies: increasing lean protein intake and no skipping meals.  Robin Knapp has agreed to follow-up with our clinic in 4 weeks. She was informed of the importance of frequent follow-up visits to maximize her success with intensive lifestyle modifications for her multiple health conditions.   Objective:   Blood pressure 126/79, pulse 74, temperature 98.6 F (37 C), temperature source Oral, height 5\' 5"  (1.651 m), weight 196 lb (88.9 kg), last menstrual period 02/24/2020, SpO2 97 %. Body mass index is 32.62 kg/m.  General: Cooperative, alert, well developed, in no acute  distress. HEENT: Conjunctivae and lids unremarkable. Cardiovascular: Regular rhythm.  Lungs: Normal work of breathing. Neurologic: No focal deficits.   Lab Results  Component Value Date   CREATININE 0.93 09/24/2019   BUN 14 09/24/2019   NA 141 09/24/2019   K 4.6 09/24/2019   CL 102 09/24/2019   CO2 24  09/24/2019   Lab Results  Component Value Date   ALT 16 09/24/2019   AST 15 09/24/2019   ALKPHOS 37 (L) 09/24/2019   BILITOT 0.4 09/24/2019   Lab Results  Component Value Date   HGBA1C 5.4 09/24/2019   HGBA1C 5.4 01/13/2019   HGBA1C 5.3 07/16/2018   HGBA1C 5.4 03/12/2018   HGBA1C 5.7 (H) 11/27/2017   Lab Results  Component Value Date   INSULIN 10.4 09/24/2019   INSULIN 8.2 01/13/2019   INSULIN 8.2 07/16/2018   INSULIN 6.7 03/12/2018   INSULIN 12.2 11/27/2017   Lab Results  Component Value Date   TSH 0.667 11/27/2017   Lab Results  Component Value Date   CHOL 217 (H) 09/24/2019   HDL 45 09/24/2019   LDLCALC 160 (H) 09/24/2019   TRIG 69 09/24/2019   Lab Results  Component Value Date   WBC 8.0 11/27/2017   HGB 13.6 11/27/2017   HCT 38.8 11/27/2017   MCV 86 11/27/2017   PLT 343 04/06/2015   Attestation Statements:   Reviewed by clinician on day of visit: allergies, medications, problem list, medical history, surgical history, family history, social history, and previous encounter notes.  I, Water quality scientist, CMA, am acting as transcriptionist for Abby Potash, PA-C  I have reviewed the above documentation for accuracy and completeness, and I agree with the above. Abby Potash, PA-C

## 2020-03-14 ENCOUNTER — Encounter: Payer: Self-pay | Admitting: Rehabilitative and Restorative Service Providers"

## 2020-03-14 ENCOUNTER — Other Ambulatory Visit: Payer: Self-pay

## 2020-03-14 ENCOUNTER — Ambulatory Visit: Payer: 59 | Admitting: Rehabilitative and Restorative Service Providers"

## 2020-03-14 DIAGNOSIS — M25561 Pain in right knee: Secondary | ICD-10-CM | POA: Diagnosis not present

## 2020-03-14 DIAGNOSIS — M6281 Muscle weakness (generalized): Secondary | ICD-10-CM

## 2020-03-14 DIAGNOSIS — M25511 Pain in right shoulder: Secondary | ICD-10-CM

## 2020-03-14 DIAGNOSIS — G8929 Other chronic pain: Secondary | ICD-10-CM

## 2020-03-14 DIAGNOSIS — M25562 Pain in left knee: Secondary | ICD-10-CM | POA: Diagnosis not present

## 2020-03-14 DIAGNOSIS — R262 Difficulty in walking, not elsewhere classified: Secondary | ICD-10-CM

## 2020-03-14 NOTE — Therapy (Signed)
[facility] [address] [place], [place], [address] Phone: [phone]   Fax:  [phone]  Physical Therapy Evaluation  Patient Details  Name: Robin Knapp MRN: [id number] Date of Birth: 11-Jul-1971 Referring Provider (PT): Dr. Erlinda Hong   Encounter Date: 03/14/2020   PT End of Session - 03/14/20 0828    Visit Number 1    Number of Visits 12    Date for PT Re-Evaluation 04/25/20    Progress Note Due on Visit 10    PT Start Time 0845    PT Stop Time 0920    PT Time Calculation (min) 35 min    Activity Tolerance Patient tolerated treatment well    Behavior During Therapy [facility] for tasks assessed/performed           Past Medical History:  Diagnosis Date  . Allergy   . Asthma   . Breast lump   . Constipation   . Dry skin   . Fatigue   . GERD (gastroesophageal reflux disease)    pregnancy related-zantac  . HTN (hypertension)   . Hyperlipidemia   . Infertility, female   . Joint pain   . Lactose intolerance   . Missed abortion    6weeks 3days  . Muscle pain   . Obesity   . Palpitations   . PCOS (polycystic ovarian syndrome)   . Seasonal allergies   . Skin rash   . Stress   . Vitamin D deficiency     Past Surgical History:  Procedure Laterality Date  . BREAST BIOPSY    . CESAREAN SECTION    . DILATION AND EVACUATION  05/09/2012   Procedure: DILATATION AND EVACUATION;  Surgeon: Marvene Staff, MD;  Location: [facility];  Service: Gynecology;  Laterality: N/A;  . EYE SURGERY    . LASIK    . [place] EXTRACTION  1994    There were no vitals filed for this visit.    Subjective Assessment - 03/14/20 0848    Subjective Pt. indicated having bilateral knee pain about a year or so but worse in last 6 months.  Pt. stated Rt shoulder pain was hurting about the same time, had injection recently and painfree at this moment.  Pt. indicated insidious onset of symptoms in knees c going down stairs first.  Initially took  Tylenol and some improvement noted.  Pt. stated switching to Tumeric and pain relieved but in last 6 months Lt knee started hurting more.  Pt. indicated one fall in last 6 months resulting in pain increase.  Both knees of concern and noisy from both knees.    Limitations Walking;Standing;House hold activities    Patient Stated Goals Pain relief, knowing what to do going forward    Currently in Pain? Yes    Pain Score 5    at worst   Pain Location Knee    Pain Orientation Left;Right   Rt more than Lt   Pain Descriptors / Indicators Aching    Pain Type Chronic pain    Pain Onset More than a month ago    Pain Frequency Intermittent    Aggravating Factors  squats, stairs, long walks, exercise class.    Pain Relieving Factors Tumeric, medicine at times    Effect of Pain on Daily Activities Limited in walking, exercise class    Multiple Pain Sites Yes    Pain Score 0    Pain Location Shoulder    Pain Orientation Right  Pain Descriptors / Indicators Aching    Pain Type Chronic pain    Pain Onset More than a month ago    Pain Frequency Intermittent    Aggravating Factors  previously painful in abduction movement, lifting    Pain Relieving Factors Reduced since injection    Effect of Pain on Daily Activities unrestricted at this time since injection              [facility] PT Assessment - 03/14/20 0001      Assessment   Medical Diagnosis Bilateral knee pain/chondromalacia, Rt shoulder pain    Referring Provider (PT) Dr. Erlinda Hong    Onset Date/Surgical Date 03/15/19    Hand Dominance Right      Precautions   Precautions None      Restrictions   Weight Bearing Restrictions No      Balance Screen   Has the patient fallen in the past 6 months Yes    How many times? 1    Has the patient had a decrease in activity level because of a fear of falling?  Yes   2/2 to symptoms   Is the patient reluctant to leave their home because of a fear of falling?  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[employer])    Leisure walking, cardio dance exercise      Cognition   Overall Cognitive Status Within Functional Limits for tasks assessed      Functional Tests   Functional tests Single leg stance;Sit to Stand;Step down      Step Down   Comments pain noted inferior knee bilateral on 6 inch step      Single Leg Stance   Comments SLS Lt 30 seconds, Rt 20 seconds c increased aberrant movement mild      Sit to Stand   Comments able to perform 18 inch chair s UE assist s symptoms      ROM / Strength   AROM / PROM / Strength Strength;AROM;PROM      AROM   Overall AROM Comments Rt shoulder, bilateral knee AROM WFL s symptoms at this time   Crepitus noted from patella in bilateral knee movement   AROM Assessment Site Shoulder;Knee    Right/Left Shoulder Left;Right    Right/Left Knee Left;Right    Right Knee Extension 0    Right Knee Flexion 130    Left Knee Extension 0    Left Knee Flexion 130      PROM   PROM Assessment Site Shoulder;Knee      Strength   Strength Assessment Site Knee;Shoulder;Hip    Right/Left Shoulder Left;Right    Right Shoulder Flexion 5/5    Right Shoulder ABduction 5/5    Right Shoulder Internal Rotation 5/5    Right Shoulder External Rotation 5/5    Left Shoulder Flexion 5/5    Left Shoulder Extension 5/5    Left Shoulder ABduction 5/5    Left Shoulder Internal Rotation 5/5    Left Shoulder External Rotation 5/5    Right/Left Hip Left;Right    Right Hip Flexion 5/5    Right Hip Extension 4+/5    Right Hip External Rotation  5/5    Right Hip ABduction 4/5    Left Hip Flexion 5/5    Left Hip Extension 4+/5    Left Hip ABduction 4/5    Right/Left Knee Left;Right    Right  Knee Flexion 5/5    Right Knee Extension 5/5    Left Knee Flexion 5/5    Left Knee Extension 5/5      Palpation   Palpation comment No specific tenderness to touch      Special Tests    Special Tests Rotator Cuff Impingement      Ambulation/Gait   Gait Comments Independent ambulation s symptoms on level surface, unremarkable in clinic                      Objective measurements completed on examination: See above findings.       [facility] PT Treatment/Exercise - 03/14/20 0001      Self-Care   Self-Care Other Self-Care Comments    Other Self-Care Comments  self care education regarding HEP, brace explanation/evaluation for home use,.  Education given regarding chondromalacia patella      Exercises   Exercises Other Exercises    Other Exercises  SLR bilateral x15, hip abduction x 15 bilateral, supine bridge x 15, SLS 30 sec x 1, sit to stand c eccentric focus                  PT Education - 03/14/20 0828    Education Details HEP, POC    Person(s) Educated Patient    Methods Explanation;Demonstration;Handout;Verbal cues    Comprehension Returned demonstration;Verbalized understanding               PT Long Term Goals - 03/14/20 0829      PT LONG TERM GOAL #1   Title Patient will demonstrate/report pain at worst less than or equal to 2/10 to facilitate minimal limitation in daily activity secondary to pain symptoms.    Status New    Target Date 04/25/20      PT LONG TERM GOAL #2   Title Patient will demonstrate independent use of home exercise program to facilitate ability to maintain/progress functional gains from skilled physical therapy services.    Status New    Target Date 04/25/20      PT LONG TERM GOAL #3   Title Patient will demonstrate return to work/recreational activity at previous level of function without limitations secondary due to condition    Status New    Target Date 04/25/20      PT LONG TERM GOAL #4   Title Patient  will demonstrate bilateral LE MMT 5/5 throughout to facilitate ability to perform usual standing, walking, stairs at PLOF s limitation due to symptoms.    Status New    Target Date 04/25/20      PT LONG TERM GOAL #5   Title Pt. will demonstrate eccentric step down 6 inch step reciprocal s symptoms to facilitate household navigation s UE assist on stairs.    Status New    Target Date 04/25/20      Additional Long Term Goals   Additional Long Term Goals Yes      PT LONG TERM GOAL #6   Title Pt. will demonstrate SLS > 30 seconds s aberrant movement to facilitate stability in daily activity.    Status New    Target Date 04/25/20  Plan - 03/14/20 0830    Clinical Impression Statement Patient is a 49 y.o. female who comes to clinic with complaints of bilateral knee pain with strength and movement coordination deficits of each affected area that impairs her ability to perform usual daily and recreational functional activities without increase difficulty/symptoms at this time.  Pt. also indicated Rt shoulder pain complaints but since improved following recent injection. Patient to benefit from skilled PT services to address impairments and limitations to improve to previous level of function without restriction secondary to condition.    Personal Factors and Comorbidities Comorbidity 3+;Other   multiple treatment locations   Comorbidities obesity, HTN, hyperlipidemia, GERD,    Stability/Clinical Decision Making Stable/Uncomplicated    Clinical Decision Making Low    Rehab Potential Good    PT Frequency 2x / week    PT Duration 6 weeks    PT Treatment/Interventions Joint Manipulations;Spinal Manipulations;Vasopneumatic Device;Taping;Dry needling;Passive range of motion;Neuromuscular re-education;Patient/family education;Manual techniques;Balance training;Therapeutic exercise;Therapeutic activities;Functional mobility training;Stair training;Gait  training;Ultrasound;ADLs/Self Care Home Management;Electrical Stimulation;Iontophoresis 4mg /ml Dexamethasone;Moist Heat    PT Next Visit Plan Review HEP.  Spanish squats, eccentric step down activity, LAQ eccentric    PT Home Exercise Plan [email or web address]    Consulted and Agree with Plan of Care Patient           Patient will benefit from skilled therapeutic intervention in order to improve the following deficits and impairments:  Abnormal gait, Decreased endurance, Hypomobility, Obesity, Decreased activity tolerance, Decreased strength, Impaired UE functional use, Pain, Decreased balance, Decreased mobility, Difficulty walking, Decreased range of motion, Decreased coordination, Impaired flexibility  Visit Diagnosis: Chronic pain of left knee  Chronic pain of right knee  Chronic right shoulder pain  Muscle weakness (generalized)  Difficulty in walking, not elsewhere classified     Problem List Patient Active Problem List   Diagnosis Date Noted  . Bilateral carpal tunnel syndrome 01/17/2018  . Other fatigue 11/27/2017  . Shortness of breath on exertion 11/27/2017  . Vitamin D deficiency 11/27/2017  . PCOS (polycystic ovarian syndrome) 11/27/2017  . Rash and nonspecific skin eruption 03/27/2015  . Facial swelling 03/27/2015  . Swelling of both lips 03/27/2015   Scot Jun, PT, DPT, OCS, ATC 03/14/20  9:30 AM    [facility] [address] [place], [place], [address] Phone: [phone]   Fax:  [phone]  Name: Robin Knapp MRN: [id number] Date of Birth: 09-12-1971

## 2020-03-14 NOTE — Patient Instructions (Signed)
Access Code: [email or web address] URL: [email or web address] Date: 03/14/2020 Prepared by: Scot Jun  Exercises Small Range Straight Leg Raise - 2 x daily - 7 x weekly - 10 reps - 3 sets Sidelying Hip Abduction - 2 x daily - 7 x weekly - 3 sets - 10 reps Supine Bridge - 2 x daily - 7 x weekly - 10 reps - 3 sets - 2 hold Single Leg Stance - 1 x daily - 7 x weekly - 1 sets - 5 reps - 30-45 hold Sit to Stand - 1 x daily - 7 x weekly - 3 sets - 10 reps

## 2020-03-16 ENCOUNTER — Other Ambulatory Visit: Payer: Self-pay

## 2020-03-16 ENCOUNTER — Ambulatory Visit: Payer: No Typology Code available for payment source | Admitting: Rehabilitative and Restorative Service Providers"

## 2020-03-16 ENCOUNTER — Encounter: Payer: Self-pay | Admitting: Rehabilitative and Restorative Service Providers"

## 2020-03-16 DIAGNOSIS — M25511 Pain in right shoulder: Secondary | ICD-10-CM | POA: Diagnosis not present

## 2020-03-16 DIAGNOSIS — M25561 Pain in right knee: Secondary | ICD-10-CM | POA: Diagnosis not present

## 2020-03-16 DIAGNOSIS — G8929 Other chronic pain: Secondary | ICD-10-CM

## 2020-03-16 DIAGNOSIS — M6281 Muscle weakness (generalized): Secondary | ICD-10-CM | POA: Diagnosis not present

## 2020-03-16 DIAGNOSIS — M25562 Pain in left knee: Secondary | ICD-10-CM | POA: Diagnosis not present

## 2020-03-16 DIAGNOSIS — R262 Difficulty in walking, not elsewhere classified: Secondary | ICD-10-CM

## 2020-03-16 NOTE — Therapy (Signed)
[facility] [place], [place], [address] Phone: [phone]   Fax:  [phone]  Physical Therapy Treatment  Patient Details  Name: Robin Knapp MRN: [id number] Date of Birth: [birth year] Referring Provider (PT): Dr. Erlinda Hong   Encounter Date: 03/16/2020   PT End of Session - 03/16/20 0930    Visit Number 2    Number of Visits 12    Date for PT Re-Evaluation 04/25/20    Progress Note Due on Visit 10    PT Start Time 0930    PT Stop Time 1010    PT Time Calculation (min) 40 min    Activity Tolerance Patient tolerated treatment well    Behavior During Therapy [facility] for tasks assessed/performed           Past Medical History:  Diagnosis Date  . Allergy   . Asthma   . Breast lump   . Constipation   . Dry skin   . Fatigue   . GERD (gastroesophageal reflux disease)    pregnancy related-zantac  . HTN (hypertension)   . Hyperlipidemia   . Infertility, female   . Joint pain   . Lactose intolerance   . Missed abortion    6weeks 3days  . Muscle pain   . Obesity   . Palpitations   . PCOS (polycystic ovarian syndrome)   . Seasonal allergies   . Skin rash   . Stress   . Vitamin D deficiency     Past Surgical History:  Procedure Laterality Date  . BREAST BIOPSY    . CESAREAN SECTION    . DILATION AND EVACUATION  05/09/2012   Procedure: DILATATION AND EVACUATION;  Surgeon: Marvene Staff, MD;  Location: [facility];  Service: Gynecology;  Laterality: N/A;  . EYE SURGERY    . LASIK    . [place] EXTRACTION  1994    There were no vitals filed for this visit.   Subjective Assessment - 03/16/20 0946    Subjective Pt. indicated feeling no pain upon arrival today at rest.  Pt. indicated hip soreness and burning noted from HEP.    Limitations Walking;Standing;House hold activities    Patient Stated Goals Pain relief, knowing what to do going forward    Currently in Pain? No/denies    Pain Score 0-No pain     Pain Location Knee    Pain Orientation Left;Right    Pain Onset More than a month ago    Pain Score 0    Pain Location Shoulder    Pain Onset More than a month ago                             [facility] Adult PT Treatment/Exercise - 03/16/20 0001      Exercises   Exercises Knee/Hip    Other Exercises  Review of previous HEP c technique cues      Knee/Hip Exercises: Aerobic   Nustep Lvl 6 10 mins      Knee/Hip Exercises: Machines for Strengthening   Cybex Knee Extension eccentric LAQ 3 x 10 10 lbs bilateral c 2 second lower      Knee/Hip Exercises: Standing   Lateral Step Up 2 sets;Both;10 reps;Step Height: 4"   eccentric focus     Knee/Hip Exercises: Seated   Other Seated Knee/Hip Exercises seated SLR 2 x 10 bilateral  PT Long Term Goals - 03/16/20 0948      PT LONG TERM GOAL #1   Title Patient will demonstrate/report pain at worst less than or equal to 2/10 to facilitate minimal limitation in daily activity secondary to pain symptoms.    Status On-going    Target Date 04/25/20      PT LONG TERM GOAL #2   Title Patient will demonstrate independent use of home exercise program to facilitate ability to maintain/progress functional gains from skilled physical therapy services.    Status On-going    Target Date 04/25/20      PT LONG TERM GOAL #3   Title Patient will demonstrate return to work/recreational activity at previous level of function without limitations secondary due to condition    Status On-going    Target Date 04/25/20      PT LONG TERM GOAL #4   Title Patient will demonstrate bilateral LE MMT 5/5 throughout to facilitate ability to perform usual standing, walking, stairs at PLOF s limitation due to symptoms.    Status On-going    Target Date 04/25/20      PT LONG TERM GOAL #5   Title Pt. will demonstrate eccentric step down 6 inch step reciprocal s symptoms to facilitate household navigation s UE assist on  stairs.    Status On-going    Target Date 04/25/20      PT LONG TERM GOAL #6   Title Pt. will demonstrate SLS > 30 seconds s aberrant movement to facilitate stability in daily activity.    Status On-going    Target Date 04/25/20                 Plan - 03/16/20 1008    Clinical Impression Statement Good tolerance to added intervention today.  Continued eccentric loading indicated as well as quad strengthening for step/squat activity.    Personal Factors and Comorbidities Comorbidity 3+;Other   multiple treatment locations   Comorbidities obesity, HTN, hyperlipidemia, GERD,    Stability/Clinical Decision Making Stable/Uncomplicated    Rehab Potential Good    PT Frequency 2x / week    PT Duration 6 weeks    PT Treatment/Interventions Joint Manipulations;Spinal Manipulations;Vasopneumatic Device;Taping;Dry needling;Passive range of motion;Neuromuscular re-education;Patient/family education;Manual techniques;Balance training;Therapeutic exercise;Therapeutic activities;Functional mobility training;Stair training;Gait training;Ultrasound;ADLs/Self Care Home Management;Electrical Stimulation;Iontophoresis 4mg /ml Dexamethasone;Moist Heat    PT Next Visit Plan Spanish squats, eccentric step down activity, LAQ eccentric    PT Home Exercise Plan [email or web address]    Consulted and Agree with Plan of Care Patient           Patient will benefit from skilled therapeutic intervention in order to improve the following deficits and impairments:  Abnormal gait, Decreased endurance, Hypomobility, Obesity, Decreased activity tolerance, Decreased strength, Impaired UE functional use, Pain, Decreased balance, Decreased mobility, Difficulty walking, Decreased range of motion, Decreased coordination, Impaired flexibility  Visit Diagnosis: Chronic pain of left knee  Chronic pain of right knee  Chronic right shoulder pain  Muscle weakness (generalized)  Difficulty in walking, not elsewhere  classified     Problem List Patient Active Problem List   Diagnosis Date Noted  . Bilateral carpal tunnel syndrome 01/17/2018  . Other fatigue 11/27/2017  . Shortness of breath on exertion 11/27/2017  . Vitamin D deficiency 11/27/2017  . PCOS (polycystic ovarian syndrome) 11/27/2017  . Rash and nonspecific skin eruption 03/27/2015  . Facial swelling 03/27/2015  . Swelling of both lips 03/27/2015   Scot Jun, PT, DPT, OCS, ATC 03/16/20  10:09  [facility] [address] [place], [place], [address] Phone: [phone]   Fax:  [phone]  Name: JENNEY BRESTER MRN: [id number] Date of Birth: 1971/07/19

## 2020-03-25 ENCOUNTER — Other Ambulatory Visit: Payer: Self-pay

## 2020-03-25 ENCOUNTER — Encounter: Payer: Self-pay | Admitting: Rehabilitative and Restorative Service Providers"

## 2020-03-25 ENCOUNTER — Ambulatory Visit: Payer: No Typology Code available for payment source | Admitting: Rehabilitative and Restorative Service Providers"

## 2020-03-25 DIAGNOSIS — M25562 Pain in left knee: Secondary | ICD-10-CM

## 2020-03-25 DIAGNOSIS — G8929 Other chronic pain: Secondary | ICD-10-CM

## 2020-03-25 DIAGNOSIS — M25561 Pain in right knee: Secondary | ICD-10-CM | POA: Diagnosis not present

## 2020-03-25 DIAGNOSIS — M6281 Muscle weakness (generalized): Secondary | ICD-10-CM | POA: Diagnosis not present

## 2020-03-25 DIAGNOSIS — R262 Difficulty in walking, not elsewhere classified: Secondary | ICD-10-CM | POA: Diagnosis not present

## 2020-03-25 NOTE — Therapy (Signed)
[facility] Physical Therapy [address] [place], [place], [address] Phone: [phone]   Fax:  [phone]  Physical Therapy Treatment  Patient Details  Name: Robin Knapp MRN: [id number] Date of Birth: [birth year] Referring Provider (PT): Dr. Erlinda Hong   Encounter Date: 03/25/2020   PT End of Session - 03/25/20 1708    Visit Number 3    Number of Visits 12    Date for PT Re-Evaluation 04/25/20    Progress Note Due on Visit 10    PT Start Time 1430    PT Stop Time 1510    PT Time Calculation (min) 40 min    Activity Tolerance Patient tolerated treatment well;No increased pain    Behavior During Therapy WFL for tasks assessed/performed           Past Medical History:  Diagnosis Date   Allergy    Asthma    Breast lump    Constipation    Dry skin    Fatigue    GERD (gastroesophageal reflux disease)    pregnancy related-zantac   HTN (hypertension)    Hyperlipidemia    Infertility, female    Joint pain    Lactose intolerance    Missed abortion    6weeks 3days   Muscle pain    Obesity    Palpitations    PCOS (polycystic ovarian syndrome)    Seasonal allergies    Skin rash    Stress    Vitamin D deficiency     Past Surgical History:  Procedure Laterality Date   BREAST BIOPSY     CESAREAN SECTION     DILATION AND EVACUATION  05/09/2012   Procedure: DILATATION AND EVACUATION;  Surgeon: Marvene Staff, MD;  Location: [facility];  Service: Gynecology;  Laterality: N/A;   EYE SURGERY     LASIK     [place]     WISDOM TOOTH EXTRACTION  1994    There were no vitals filed for this visit.   Subjective Assessment - 03/25/20 1652    Subjective Robin Knapp notes progress with stairs.  She reports good HEP compliance.    Limitations Walking;Standing;House hold activities    Patient Stated Goals Pain relief, knowing what to do going forward.    Currently in Pain? No/denies    Pain Score 0-No pain    Pain Onset More than a  month ago    Pain Frequency Intermittent    Aggravating Factors  Prolonged weight-bearing.    Pain Relieving Factors Exercise    Effect of Pain on Daily Activities Limits stairs, walking and participation in exercise class.    Multiple Pain Sites No    Pain Score 0    Pain Onset More than a month ago    Pain Frequency Intermittent                             [facility] Adult PT Treatment/Exercise - 03/25/20 0001      Exercises   Exercises Knee/Hip    Other Exercises  Review of previous HEP c technique cues      Knee/Hip Exercises: Machines for Strengthening   Cybex Knee Extension Eccentric emphasis 2 sets of 20 10 lbs bilateral with single leg lower c 5 second lower      Knee/Hip Exercises: Standing   Lateral Step Up Both   Step up/back and up/over 2 sets of 5 each level 2 aerobic     Knee/Hip Exercises: Seated  Other Seated Knee/Hip Exercises Seated straight leg raises 4 sets of 5    Sit to Sand 5 reps;1 set;Other (comment)   Slow eccentrics     Knee/Hip Exercises: Sidelying   Hip ABduction Both;5 reps   1/4 turn to stomach                 PT Education - 03/25/20 1706    Education Details Reviewed HEP.  Recommended pillows with seated straight leg raises.    Person(s) Educated Patient    Methods Explanation;Demonstration;Verbal cues    Comprehension Verbalized understanding;Returned demonstration;Need further instruction;Verbal cues required               PT Long Term Goals - 03/25/20 1707      PT LONG TERM GOAL #1   Title Patient will demonstrate/report pain at worst less than or equal to 2/10 to facilitate minimal limitation in daily activity secondary to pain symptoms.    Baseline No pain on 03/25/2020.    Status Partially Met      PT LONG TERM GOAL #2   Title Patient will demonstrate independent use of home exercise program to facilitate ability to maintain/progress functional gains from skilled physical therapy services.    Baseline  Improving independence.    Status Partially Met      PT LONG TERM GOAL #3   Title Patient will demonstrate return to work/recreational activity at previous level of function without limitations secondary due to condition    Baseline Easing back in.    Status Partially Met      PT LONG TERM GOAL #4   Title Patient will demonstrate bilateral LE MMT 5/5 throughout to facilitate ability to perform usual standing, walking, stairs at PLOF s limitation due to symptoms.    Status On-going      PT LONG TERM GOAL #5   Title Pt. will demonstrate eccentric step down 6 inch step reciprocal s symptoms to facilitate household navigation s UE assist on stairs.    Baseline Some eccentric weakness noted.    Status On-going      PT LONG TERM GOAL #6   Title Pt. will demonstrate SLS > 30 seconds s aberrant movement to facilitate stability in daily activity.    Status On-going                 Plan - 03/25/20 1709    Clinical Impression Statement Robin Knapp notes significant early progress with her pain and function with stairs.  She is easing back into her workout routine.  Eccentric quadriceps weakness is noted with step downs and will benefit from continued work in preparation for return to full activity.    Personal Factors and Comorbidities Comorbidity 3+;Other   multiple treatment locations   Comorbidities obesity, HTN, hyperlipidemia, GERD,    Stability/Clinical Decision Making Stable/Uncomplicated    Rehab Potential Good    PT Frequency 2x / week    PT Duration 6 weeks    PT Treatment/Interventions Joint Manipulations;Spinal Manipulations;Vasopneumatic Device;Taping;Dry needling;Passive range of motion;Neuromuscular re-education;Patient/family education;Manual techniques;Balance training;Therapeutic exercise;Therapeutic activities;Functional mobility training;Stair training;Gait training;Ultrasound;ADLs/Self Care Home Management;Electrical Stimulation;Iontophoresis 51m/ml Dexamethasone;Moist  Heat    PT Next Visit Plan Spanish squats, eccentric step down activity, LAQ eccentric    PT Home Exercise Plan [id number]    Consulted and Agree with Plan of Care Patient           Patient will benefit from skilled therapeutic intervention in order to improve the following deficits and impairments:  Abnormal  gait, Decreased endurance, Hypomobility, Obesity, Decreased activity tolerance, Decreased strength, Impaired UE functional use, Pain, Decreased balance, Decreased mobility, Difficulty walking, Decreased range of motion, Decreased coordination, Impaired flexibility  Visit Diagnosis: Chronic pain of left knee  Difficulty in walking, not elsewhere classified  Chronic pain of right knee  Muscle weakness (generalized)     Problem List Patient Active Problem List   Diagnosis Date Noted   Bilateral carpal tunnel syndrome 01/17/2018   Other fatigue 11/27/2017   Shortness of breath on exertion 11/27/2017   Vitamin D deficiency 11/27/2017   PCOS (polycystic ovarian syndrome) 11/27/2017   Rash and nonspecific skin eruption 03/27/2015   Facial swelling 03/27/2015   Swelling of both lips 03/27/2015    Farley Ly PT, MPT 03/25/2020, 5:11 PM  [facility] Physical Therapy [address] [place], [place], [address] Phone: [phone]   Fax:  [phone]  Name: Robin Knapp MRN: [id number] Date of Birth: 1971/04/29

## 2020-04-04 ENCOUNTER — Other Ambulatory Visit: Payer: Self-pay

## 2020-04-04 ENCOUNTER — Encounter: Payer: Self-pay | Admitting: Rehabilitative and Restorative Service Providers"

## 2020-04-04 ENCOUNTER — Ambulatory Visit: Payer: No Typology Code available for payment source | Admitting: Rehabilitative and Restorative Service Providers"

## 2020-04-04 DIAGNOSIS — M25562 Pain in left knee: Secondary | ICD-10-CM | POA: Diagnosis not present

## 2020-04-04 DIAGNOSIS — M25561 Pain in right knee: Secondary | ICD-10-CM

## 2020-04-04 DIAGNOSIS — M6281 Muscle weakness (generalized): Secondary | ICD-10-CM | POA: Diagnosis not present

## 2020-04-04 DIAGNOSIS — M25511 Pain in right shoulder: Secondary | ICD-10-CM | POA: Diagnosis not present

## 2020-04-04 DIAGNOSIS — G8929 Other chronic pain: Secondary | ICD-10-CM

## 2020-04-04 DIAGNOSIS — R262 Difficulty in walking, not elsewhere classified: Secondary | ICD-10-CM

## 2020-04-04 NOTE — Therapy (Signed)
[facility] Physical Therapy [address] [place], [place], [address] Phone: [phone]   Fax:  [phone]  Physical Therapy Treatment  Patient Details  Name: Robin Knapp MRN: [id number] Date of Birth: 01-Nov-1970 Referring Provider (PT): Dr. Erlinda Hong   Encounter Date: 04/04/2020   PT End of Session - 04/04/20 1112    Visit Number 4    Number of Visits 12    Date for PT Re-Evaluation 04/25/20    Progress Note Due on Visit 10    PT Start Time 1100    PT Stop Time 1141    PT Time Calculation (min) 41 min    Activity Tolerance Patient tolerated treatment well;No increased pain    Behavior During Therapy WFL for tasks assessed/performed           Past Medical History:  Diagnosis Date  . Allergy   . Asthma   . Breast lump   . Constipation   . Dry skin   . Fatigue   . GERD (gastroesophageal reflux disease)    pregnancy related-zantac  . HTN (hypertension)   . Hyperlipidemia   . Infertility, female   . Joint pain   . Lactose intolerance   . Missed abortion    6weeks 3days  . Muscle pain   . Obesity   . Palpitations   . PCOS (polycystic ovarian syndrome)   . Seasonal allergies   . Skin rash   . Stress   . Vitamin D deficiency     Past Surgical History:  Procedure Laterality Date  . BREAST BIOPSY    . CESAREAN SECTION    . DILATION AND EVACUATION  05/09/2012   Procedure: DILATATION AND EVACUATION;  Surgeon: Marvene Staff, MD;  Location: [facility];  Service: Gynecology;  Laterality: N/A;  . EYE SURGERY    . LASIK    . [place] EXTRACTION  1994    There were no vitals filed for this visit.   Subjective Assessment - 04/04/20 1110    Subjective Pt. indicated feeling less pain overall and was able to do some squat activity.    Limitations Walking;Standing;House hold activities    Patient Stated Goals Pain relief, knowing what to do going forward.    Currently in Pain? No/denies    Pain Score 0-No pain    Pain Location  Knee    Pain Orientation Left;Right    Pain Descriptors / Indicators Aching    Pain Type Chronic pain    Pain Onset More than a month ago    Pain Frequency Occasional    Aggravating Factors  squat activity at times    Pain Score 0    Pain Location Shoulder    Pain Orientation Right    Pain Onset More than a month ago                             [facility] Adult PT Treatment/Exercise - 04/04/20 0001      Exercises   Other Exercises  additional time spent in ther ex due to exercise control type.       Knee/Hip Exercises: Aerobic   Recumbent Bike Lvl 3 10 mins      Knee/Hip Exercises: Machines for Strengthening   Cybex Knee Extension eccentric LAQ 15 lbs 3 x 10 bilateral      Knee/Hip Exercises: Standing   Lateral Step Up 2 sets;10 reps;Step Height: 6"  Other Standing Knee Exercises spanish squat 3 x 10                       PT Long Term Goals - 03/25/20 1707      PT LONG TERM GOAL #1   Title Patient will demonstrate/report pain at worst less than or equal to 2/10 to facilitate minimal limitation in daily activity secondary to pain symptoms.    Baseline No pain on 03/25/2020.    Status Partially Met      PT LONG TERM GOAL #2   Title Patient will demonstrate independent use of home exercise program to facilitate ability to maintain/progress functional gains from skilled physical therapy services.    Baseline Improving independence.    Status Partially Met      PT LONG TERM GOAL #3   Title Patient will demonstrate return to work/recreational activity at previous level of function without limitations secondary due to condition    Baseline Easing back in.    Status Partially Met      PT LONG TERM GOAL #4   Title Patient will demonstrate bilateral LE MMT 5/5 throughout to facilitate ability to perform usual standing, walking, stairs at PLOF s limitation due to symptoms.    Status On-going      PT LONG TERM GOAL #5   Title Pt. will demonstrate  eccentric step down 6 inch step reciprocal s symptoms to facilitate household navigation s UE assist on stairs.    Baseline Some eccentric weakness noted.    Status On-going      PT LONG TERM GOAL #6   Title Pt. will demonstrate SLS > 30 seconds s aberrant movement to facilitate stability in daily activity.    Status On-going                 Plan - 04/04/20 1131    Clinical Impression Statement Continued overall improvement in movement patterns c reduced pain reported.  Transition back to recreational workout activity showing promise at this time.    Personal Factors and Comorbidities Comorbidity 3+;Other   multiple treatment locations   Comorbidities obesity, HTN, hyperlipidemia, GERD,    Stability/Clinical Decision Making Stable/Uncomplicated    Rehab Potential Good    PT Frequency 2x / week    PT Duration 6 weeks    PT Treatment/Interventions Joint Manipulations;Spinal Manipulations;Vasopneumatic Device;Taping;Dry needling;Passive range of motion;Neuromuscular re-education;Patient/family education;Manual techniques;Balance training;Therapeutic exercise;Therapeutic activities;Functional mobility training;Stair training;Gait training;Ultrasound;ADLs/Self Care Home Management;Electrical Stimulation;Iontophoresis 2m/ml Dexamethasone;Moist Heat    PT Next Visit Plan Reduced frequency of in clinic visits c HEP focus for possible D/C in next visit or two pending symptoms.    PT Home Exercise Plan [id number]    Consulted and Agree with Plan of Care Patient           Patient will benefit from skilled therapeutic intervention in order to improve the following deficits and impairments:  Abnormal gait, Decreased endurance, Hypomobility, Obesity, Decreased activity tolerance, Decreased strength, Impaired UE functional use, Pain, Decreased balance, Decreased mobility, Difficulty walking, Decreased range of motion, Decreased coordination, Impaired flexibility  Visit Diagnosis: Chronic pain of  left knee  Chronic pain of right knee  Chronic right shoulder pain  Muscle weakness (generalized)  Difficulty in walking, not elsewhere classified     Problem List Patient Active Problem List   Diagnosis Date Noted  . Bilateral carpal tunnel syndrome 01/17/2018  . Other fatigue 11/27/2017  . Shortness of breath on exertion 11/27/2017  . Vitamin  D deficiency 11/27/2017  . PCOS (polycystic ovarian syndrome) 11/27/2017  . Rash and nonspecific skin eruption 03/27/2015  . Facial swelling 03/27/2015  . Swelling of both lips 03/27/2015    Scot Jun, PT, DPT, OCS, ATC 04/04/20  11:40 AM    [facility] Physical Therapy [address] [place], [place], [address] Phone: [phone]   Fax:  [phone]  Name: Robin Knapp MRN: [id number] Date of Birth: [birth year]

## 2020-04-05 ENCOUNTER — Ambulatory Visit (INDEPENDENT_AMBULATORY_CARE_PROVIDER_SITE_OTHER): Payer: No Typology Code available for payment source | Admitting: Physician Assistant

## 2020-04-05 ENCOUNTER — Encounter (INDEPENDENT_AMBULATORY_CARE_PROVIDER_SITE_OTHER): Payer: Self-pay | Admitting: Physician Assistant

## 2020-04-05 VITALS — BP 117/77 | HR 70 | Temp 98.1°F | Ht 65.0 in | Wt 192.0 lb

## 2020-04-05 DIAGNOSIS — E559 Vitamin D deficiency, unspecified: Secondary | ICD-10-CM | POA: Diagnosis not present

## 2020-04-05 DIAGNOSIS — E7849 Other hyperlipidemia: Secondary | ICD-10-CM | POA: Diagnosis not present

## 2020-04-05 DIAGNOSIS — Z9189 Other specified personal risk factors, not elsewhere classified: Secondary | ICD-10-CM

## 2020-04-05 DIAGNOSIS — Z6832 Body mass index (BMI) 32.0-32.9, adult: Secondary | ICD-10-CM

## 2020-04-05 DIAGNOSIS — R7303 Prediabetes: Secondary | ICD-10-CM

## 2020-04-05 DIAGNOSIS — E669 Obesity, unspecified: Secondary | ICD-10-CM

## 2020-04-05 NOTE — Progress Notes (Signed)
Chief Complaint:   OBESITY Robin Knapp is here to discuss her progress with her obesity treatment plan along with follow-up of her obesity related diagnoses. Robin Knapp is on the Category 2 Plan (modified) and states she is following her eating plan approximately 85% of the time. [name] states she is walking 60 minutes 7 times per week and doing cardio 30 minutes 4 times per week.  Today's visit was #: 14 Starting weight: 199 lbs Starting date: 11/27/2017 Today's weight: 192 lbs Today's date: 04/05/2020 Total lbs lost to date: 7 Total lbs lost since last in-office visit: 4  Interim History: Robin Knapp states that she was off last week and did very well getting all of her food in. She is going to start packing her lunch more often and taking it with her to work.  Subjective:   Other hyperlipidemia. Robin Knapp is on no medication. No chest pain. She is exercising regularly. Is due for labs.  Lab Results  Component Value Date   CHOL 217 (H) 09/24/2019   HDL 45 09/24/2019   LDLCALC 160 (H) 09/24/2019   TRIG 69 09/24/2019   Lab Results  Component Value Date   ALT 16 09/24/2019   AST 15 09/24/2019   ALKPHOS 37 (L) 09/24/2019   BILITOT 0.4 09/24/2019   The 10-year ASCVD risk score [name], et al., 2013) is: 3.3%   Values used to calculate the score:     Age: 49 years     Sex: Female     Is Non-Hispanic African American: Yes     Diabetic: Yes     Tobacco smoker: No     Systolic Blood Pressure: 086 mmHg     Is BP treated: No     HDL Cholesterol: 45 mg/dL     Total Cholesterol: 217 mg/dL  Vitamin D deficiency. [name] is on OTC Vitamin D supplementation. She is due for labs.   Ref. Range 09/24/2019 09:59  Vitamin D, 25-Hydroxy Latest Ref Range: 30.0 - 100.0 ng/mL 56.1   Prediabetes. Robin Knapp has a diagnosis of prediabetes based on her elevated HgA1c and was informed this puts her at greater risk of developing diabetes. She continues to work on diet and exercise to  decrease her risk of diabetes. Robin Knapp is on metformin. No nausea, vomiting, or diarrhea. She is due for labs.  Lab Results  Component Value Date   HGBA1C 5.4 09/24/2019   Lab Results  Component Value Date   INSULIN 10.4 09/24/2019   INSULIN 8.2 01/13/2019   INSULIN 8.2 07/16/2018   INSULIN 6.7 03/12/2018   INSULIN 12.2 11/27/2017   At risk for heart disease. [name] is at a higher than average risk for cardiovascular disease due to obesity.   Assessment/Plan:   Other hyperlipidemia. Cardiovascular risk and specific lipid/LDL goals reviewed.  We discussed several lifestyle modifications today and [name] will continue to work on diet, exercise and weight loss efforts. Orders and follow up as documented in patient record. Comprehensive metabolic panel, Lipid Panel With LDL/HDL Ratio labs will be checked today.   Counseling Intensive lifestyle modifications are the first line treatment for this issue. . Dietary changes: Increase soluble fiber. Decrease simple carbohydrates. . Exercise changes: Moderate to vigorous-intensity aerobic activity 150 minutes per week if tolerated. . Lipid-lowering medications: see documented in medical record.   Vitamin D deficiency. Low Vitamin D level contributes to fatigue and are associated with obesity, breast, and colon cancer. She agrees to continue to take OTC Vitamin D  as directed and VITAMIN D 25 Hydroxy (Vit-D Deficiency, Fractures) level will be checked today.  Prediabetes. Robin Knapp will continue to work on weight loss, exercise, and decreasing simple carbohydrates to help decrease the risk of diabetes. She will continue her medication as directed. Hemoglobin A1c, Insulin, random levels will be checked today.  At risk for heart disease. Robin Knapp was given approximately 15 minutes of coronary artery disease prevention counseling today. She is 49 y.o. female and has risk factors for heart disease including obesity. We discussed intensive lifestyle  modifications today with an emphasis on specific weight loss instructions and strategies.   Repetitive spaced learning was employed today to elicit superior memory formation and behavioral change.  Class 1 obesity with serious comorbidity and body mass index (BMI) of 32.0 to 32.9 in adult, unspecified obesity type.  Robin Knapp is currently in the action stage of change. As such, her goal is to continue with weight loss efforts. She has agreed to the Category 2 Plan (modified).   Exercise goals: For substantial health benefits, adults should do at least 150 minutes (2 hours and 30 minutes) a week of moderate-intensity, or 75 minutes (1 hour and 15 minutes) a week of vigorous-intensity aerobic physical activity, or an equivalent combination of moderate- and vigorous-intensity aerobic activity. Aerobic activity should be performed in episodes of at least 10 minutes, and preferably, it should be spread throughout the week.  Behavioral modification strategies: meal planning and cooking strategies and keeping healthy foods in the home.  Robin Knapp has agreed to follow-up with our clinic in 3 weeks. She was informed of the importance of frequent follow-up visits to maximize her success with intensive lifestyle modifications for her multiple health conditions.   Robin Knapp was informed we would discuss her lab results at her next visit unless there is a critical issue that needs to be addressed sooner. Robin Knapp agreed to keep her next visit at the agreed upon time to discuss these results.  Objective:   Blood pressure 117/77, pulse 70, temperature 98.1 F (36.7 C), temperature source Oral, height 5\' 5"  (1.651 m), weight 192 lb (87.1 kg), SpO2 98 %. Body mass index is 31.95 kg/m.  General: Cooperative, alert, well developed, in no acute distress. HEENT: Conjunctivae and lids unremarkable. Cardiovascular: Regular rhythm.  Lungs: Normal work of breathing. Neurologic: No focal deficits.   Lab Results    Component Value Date   CREATININE 0.93 09/24/2019   BUN 14 09/24/2019   NA 141 09/24/2019   K 4.6 09/24/2019   CL 102 09/24/2019   CO2 24 09/24/2019   Lab Results  Component Value Date   ALT 16 09/24/2019   AST 15 09/24/2019   ALKPHOS 37 (L) 09/24/2019   BILITOT 0.4 09/24/2019   Lab Results  Component Value Date   HGBA1C 5.4 09/24/2019   HGBA1C 5.4 01/13/2019   HGBA1C 5.3 07/16/2018   HGBA1C 5.4 03/12/2018   HGBA1C 5.7 (H) 11/27/2017   Lab Results  Component Value Date   INSULIN 10.4 09/24/2019   INSULIN 8.2 01/13/2019   INSULIN 8.2 07/16/2018   INSULIN 6.7 03/12/2018   INSULIN 12.2 11/27/2017   Lab Results  Component Value Date   TSH 0.667 11/27/2017   Lab Results  Component Value Date   CHOL 217 (H) 09/24/2019   HDL 45 09/24/2019   LDLCALC 160 (H) 09/24/2019   TRIG 69 09/24/2019   Lab Results  Component Value Date   WBC 8.0 11/27/2017   HGB 13.6 11/27/2017   HCT 38.8 11/27/2017  MCV 86 11/27/2017   PLT 343 04/06/2015   No results found for: IRON, TIBC, FERRITIN  Attestation Statements:   Reviewed by clinician on day of visit: allergies, medications, problem list, medical history, surgical history, family history, social history, and previous encounter notes.  IMichaelene Song, am acting as transcriptionist for Abby Potash, PA-C   I have reviewed the above documentation for accuracy and completeness, and I agree with the above. Abby Potash, PA-C

## 2020-04-06 LAB — COMPREHENSIVE METABOLIC PANEL
ALT: 15 IU/L (ref 0–32)
AST: 13 IU/L (ref 0–40)
Albumin/Globulin Ratio: 2 (ref 1.2–2.2)
Albumin: 4.5 g/dL (ref 3.8–4.8)
Alkaline Phosphatase: 37 IU/L — ABNORMAL LOW (ref 48–121)
BUN/Creatinine Ratio: 18 (ref 9–23)
BUN: 17 mg/dL (ref 6–24)
Bilirubin Total: 0.6 mg/dL (ref 0.0–1.2)
CO2: 25 mmol/L (ref 20–29)
Calcium: 9.2 mg/dL (ref 8.7–10.2)
Chloride: 101 mmol/L (ref 96–106)
Creatinine, Ser: 0.93 mg/dL (ref 0.57–1.00)
GFR calc Af Amer: 85 mL/min/{1.73_m2} (ref >59)
GFR calc non Af Amer: 73 mL/min/{1.73_m2} (ref >59)
Globulin, Total: 2.3 g/dL (ref 1.5–4.5)
Glucose: 89 mg/dL (ref 65–99)
Potassium: 4.4 mmol/L (ref 3.5–5.2)
Sodium: 141 mmol/L (ref 134–144)
Total Protein: 6.8 g/dL (ref 6.0–8.5)

## 2020-04-06 LAB — LIPID PANEL WITH LDL/HDL RATIO
Cholesterol, Total: 218 mg/dL — ABNORMAL HIGH (ref 100–199)
HDL: 47 mg/dL (ref >39)
LDL Chol Calc (NIH): 160 mg/dL — ABNORMAL HIGH (ref 0–99)
LDL/HDL Ratio: 3.4 ratio — ABNORMAL HIGH (ref 0.0–3.2)
Triglycerides: 65 mg/dL (ref 0–149)
VLDL Cholesterol Cal: 11 mg/dL (ref 5–40)

## 2020-04-06 LAB — INSULIN, RANDOM: INSULIN: 12.7 u[IU]/mL (ref 2.6–24.9)

## 2020-04-06 LAB — VITAMIN D 25 HYDROXY (VIT D DEFICIENCY, FRACTURES): Vit D, 25-Hydroxy: 58.2 ng/mL (ref 30.0–100.0)

## 2020-04-06 LAB — HEMOGLOBIN A1C
Est. average glucose Bld gHb Est-mCnc: 111 mg/dL
Hgb A1c MFr Bld: 5.5 % (ref 4.8–5.6)

## 2020-04-12 ENCOUNTER — Encounter: Payer: No Typology Code available for payment source | Admitting: Rehabilitative and Restorative Service Providers"

## 2020-04-15 ENCOUNTER — Encounter: Payer: Self-pay | Admitting: Physical Therapy

## 2020-04-15 ENCOUNTER — Other Ambulatory Visit: Payer: Self-pay

## 2020-04-15 ENCOUNTER — Ambulatory Visit: Payer: No Typology Code available for payment source | Admitting: Physical Therapy

## 2020-04-15 DIAGNOSIS — M6281 Muscle weakness (generalized): Secondary | ICD-10-CM | POA: Diagnosis not present

## 2020-04-15 DIAGNOSIS — G8929 Other chronic pain: Secondary | ICD-10-CM

## 2020-04-15 DIAGNOSIS — M25561 Pain in right knee: Secondary | ICD-10-CM

## 2020-04-15 DIAGNOSIS — M25511 Pain in right shoulder: Secondary | ICD-10-CM

## 2020-04-15 DIAGNOSIS — M25562 Pain in left knee: Secondary | ICD-10-CM | POA: Diagnosis not present

## 2020-04-15 DIAGNOSIS — R262 Difficulty in walking, not elsewhere classified: Secondary | ICD-10-CM

## 2020-04-15 NOTE — Therapy (Signed)
[facility] Physical Therapy [address] [place], [place], [address] Phone: [phone]   Fax:  [phone]  Physical Therapy Treatment  Patient Details  Name: Robin Knapp MRN: [id number] Date of Birth: 1971/07/24 Referring Provider (PT): Dr. Erlinda Hong   Encounter Date: 04/15/2020   PT End of Session - 04/15/20 0812    Visit Number 5    Number of Visits 12    Date for PT Re-Evaluation 04/25/20    PT Start Time 0803    PT Stop Time 0843    PT Time Calculation (min) 40 min    Activity Tolerance Patient tolerated treatment well;No increased pain    Behavior During Therapy WFL for tasks assessed/performed           Past Medical History:  Diagnosis Date  . Allergy   . Asthma   . Breast lump   . Constipation   . Dry skin   . Fatigue   . GERD (gastroesophageal reflux disease)    pregnancy related-zantac  . HTN (hypertension)   . Hyperlipidemia   . Infertility, female   . Joint pain   . Lactose intolerance   . Missed abortion    6weeks 3days  . Muscle pain   . Obesity   . Palpitations   . PCOS (polycystic ovarian syndrome)   . Seasonal allergies   . Skin rash   . Stress   . Vitamin D deficiency     Past Surgical History:  Procedure Laterality Date  . BREAST BIOPSY    . CESAREAN SECTION    . DILATION AND EVACUATION  05/09/2012   Procedure: DILATATION AND EVACUATION;  Surgeon: Marvene Staff, MD;  Location: [facility];  Service: Gynecology;  Laterality: N/A;  . EYE SURGERY    . LASIK    . [place] EXTRACTION  1994    There were no vitals filed for this visit.   Subjective Assessment - 04/15/20 0809    Subjective Pt. indicated feeling less pain overall and was able to do some squat activity.    Limitations Walking;Standing;House hold activities    Patient Stated Goals Pain relief, knowing what to do going forward.    Currently in Pain? No/denies    Pain Onset More than a month ago                              [facility] Adult PT Treatment/Exercise - 04/15/20 0001      Neuro Re-ed    Neuro Re-ed Details  3 anterior cones tapping 5 x each direction (10 reps total on each LE) to progress SLS and control      Exercises   Other Exercises  additional time spent in ther ex due to exercise control type.       Knee/Hip Exercises: Stretches   Press photographer Both;2 reps;30 seconds      Knee/Hip Exercises: Aerobic   Recumbent Bike Lvl 3 10 mins      Knee/Hip Exercises: Machines for Strengthening   Cybex Knee Extension eccentric LAQ 15 lbs 2 x 15 bilateral      Knee/Hip Exercises: Standing   Lateral Step Up 2 sets;15 reps;Step Height: 6"   leading with LE   Other Standing Knee Exercises spanish squat 3 x 10    Other Standing Knee Exercises Airex mat mini squats with intructions to keep knees behind toes 2 x 10  PT Long Term Goals - 04/15/20 0905      PT LONG TERM GOAL #1   Title Patient will demonstrate/report pain at worst less than or equal to 2/10 to facilitate minimal limitation in daily activity secondary to pain symptoms.    Status Partially Met      PT LONG TERM GOAL #2   Title Patient will demonstrate independent use of home exercise program to facilitate ability to maintain/progress functional gains from skilled physical therapy services.    Status Partially Met      PT LONG TERM GOAL #3   Title Patient will demonstrate return to work/recreational activity at previous level of function without limitations secondary due to condition    Status Partially Met      PT LONG TERM GOAL #4   Title Patient will demonstrate bilateral LE MMT 5/5 throughout to facilitate ability to perform usual standing, walking, stairs at PLOF s limitation due to symptoms.    Status On-going      PT LONG TERM GOAL #5   Title Pt. will demonstrate eccentric step down 6 inch step reciprocal s symptoms to facilitate household navigation s UE  assist on stairs.    Status On-going                 Plan - 04/15/20 1324    Clinical Impression Statement Pt tolerating treatment well, no reports of increased pain during session. Concentration on proper techniques with squating and eccentric control. Continue skilled PT to progress toward goals set.    Personal Factors and Comorbidities Comorbidity 3+;Other    Comorbidities obesity, HTN, hyperlipidemia, GERD,    Stability/Clinical Decision Making Stable/Uncomplicated    Rehab Potential Good    PT Frequency 2x / week    PT Duration 6 weeks    PT Treatment/Interventions Joint Manipulations;Spinal Manipulations;Vasopneumatic Device;Taping;Dry needling;Passive range of motion;Neuromuscular re-education;Patient/family education;Manual techniques;Balance training;Therapeutic exercise;Therapeutic activities;Functional mobility training;Stair training;Gait training;Ultrasound;ADLs/Self Care Home Management;Electrical Stimulation;Iontophoresis 49m/ml Dexamethasone;Moist Heat    PT Next Visit Plan Reduced frequency of in clinic visits c HEP focus for possible D/C in next visit or two pending symptoms.    PT Home Exercise Plan [id number]    Consulted and Agree with Plan of Care Patient           Patient will benefit from skilled therapeutic intervention in order to improve the following deficits and impairments:  Abnormal gait, Decreased endurance, Hypomobility, Obesity, Decreased activity tolerance, Decreased strength, Impaired UE functional use, Pain, Decreased balance, Decreased mobility, Difficulty walking, Decreased range of motion, Decreased coordination, Impaired flexibility  Visit Diagnosis: Chronic pain of left knee  Chronic pain of right knee  Chronic right shoulder pain  Muscle weakness (generalized)  Difficulty in walking, not elsewhere classified     Problem List Patient Active Problem List   Diagnosis Date Noted  . Bilateral carpal tunnel syndrome 01/17/2018  .  Other fatigue 11/27/2017  . Shortness of breath on exertion 11/27/2017  . Vitamin D deficiency 11/27/2017  . PCOS (polycystic ovarian syndrome) 11/27/2017  . Rash and nonspecific skin eruption 03/27/2015  . Facial swelling 03/27/2015  . Swelling of both lips 03/27/2015    JOretha Caprice PT, MPT 04/15/2020, 9:13 AM  [facility] [address] [address]Phone: [phone]  Fax:  [phone] Name: Robin MAHLERMRN: [id number] of Birth: 111/13/1972

## 2020-04-25 ENCOUNTER — Encounter (INDEPENDENT_AMBULATORY_CARE_PROVIDER_SITE_OTHER): Payer: Self-pay | Admitting: Physician Assistant

## 2020-04-25 ENCOUNTER — Other Ambulatory Visit: Payer: Self-pay

## 2020-04-25 ENCOUNTER — Ambulatory Visit: Payer: No Typology Code available for payment source | Admitting: Rehabilitative and Restorative Service Providers"

## 2020-04-25 ENCOUNTER — Encounter: Payer: Self-pay | Admitting: Rehabilitative and Restorative Service Providers"

## 2020-04-25 ENCOUNTER — Ambulatory Visit (INDEPENDENT_AMBULATORY_CARE_PROVIDER_SITE_OTHER): Payer: No Typology Code available for payment source | Admitting: Physician Assistant

## 2020-04-25 VITALS — BP 131/73 | HR 78 | Temp 98.0°F | Ht 65.0 in | Wt 193.0 lb

## 2020-04-25 DIAGNOSIS — Z6832 Body mass index (BMI) 32.0-32.9, adult: Secondary | ICD-10-CM

## 2020-04-25 DIAGNOSIS — E7849 Other hyperlipidemia: Secondary | ICD-10-CM

## 2020-04-25 DIAGNOSIS — M25511 Pain in right shoulder: Secondary | ICD-10-CM | POA: Diagnosis not present

## 2020-04-25 DIAGNOSIS — R7303 Prediabetes: Secondary | ICD-10-CM

## 2020-04-25 DIAGNOSIS — G8929 Other chronic pain: Secondary | ICD-10-CM

## 2020-04-25 DIAGNOSIS — M6281 Muscle weakness (generalized): Secondary | ICD-10-CM | POA: Diagnosis not present

## 2020-04-25 DIAGNOSIS — M25562 Pain in left knee: Secondary | ICD-10-CM

## 2020-04-25 DIAGNOSIS — M25561 Pain in right knee: Secondary | ICD-10-CM | POA: Diagnosis not present

## 2020-04-25 DIAGNOSIS — E669 Obesity, unspecified: Secondary | ICD-10-CM | POA: Diagnosis not present

## 2020-04-25 DIAGNOSIS — R262 Difficulty in walking, not elsewhere classified: Secondary | ICD-10-CM

## 2020-04-25 NOTE — Therapy (Signed)
[facility] Physical Therapy [address] [place], [place], [address] Phone: [phone]   Fax:  [phone]  Physical Therapy Treatment/Discharge  Patient Details  Name: Robin Knapp MRN: [id number] Date of Birth: June 07, 1971 Referring Provider (PT): Dr. Erlinda Hong   Encounter Date: 04/25/2020   PT End of Session - 04/25/20 1026    Visit Number 6    Number of Visits 12    Date for PT Re-Evaluation 04/25/20    PT Start Time 1016    PT Stop Time 1055    PT Time Calculation (min) 39 min    Activity Tolerance Patient tolerated treatment well;No increased pain    Behavior During Therapy WFL for tasks assessed/performed           Past Medical History:  Diagnosis Date  . Allergy   . Asthma   . Breast lump   . Constipation   . Dry skin   . Fatigue   . GERD (gastroesophageal reflux disease)    pregnancy related-zantac  . HTN (hypertension)   . Hyperlipidemia   . Infertility, female   . Joint pain   . Lactose intolerance   . Missed abortion    6weeks 3days  . Muscle pain   . Obesity   . Palpitations   . PCOS (polycystic ovarian syndrome)   . Seasonal allergies   . Skin rash   . Stress   . Vitamin D deficiency     Past Surgical History:  Procedure Laterality Date  . BREAST BIOPSY    . CESAREAN SECTION    . DILATION AND EVACUATION  05/09/2012   Procedure: DILATATION AND EVACUATION;  Surgeon: Marvene Staff, MD;  Location: [facility];  Service: Gynecology;  Laterality: N/A;  . EYE SURGERY    . LASIK    . [place] EXTRACTION  1994    There were no vitals filed for this visit.   Subjective Assessment - 04/25/20 1021    Subjective Pt. indicated weekend was pretty good overall and didn't notice pain.  Pt. indicated feeling some knee pain c stairs today.  GROC rated at +4    Limitations Walking;Standing;House hold activities    Patient Stated Goals Pain relief, knowing what to do going forward.    Currently in Pain? Yes    Pain Score  3     Pain Location Knee    Pain Orientation Right    Pain Descriptors / Indicators Aching    Pain Onset More than a month ago    Pain Frequency Occasional    Aggravating Factors  stairs    Pain Relieving Factors Therapy as helped    Effect of Pain on Daily Activities stairs    Multiple Pain Sites No              [facility] PT Assessment - 04/25/20 0001      Assessment   Medical Diagnosis Bilateral knee pain/chondromalacia, Rt shoulder pain    Referring Provider (PT) Dr. 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       Knee/Hip Exercises: Stretches   Gastroc Stretch Both;2 reps;30 seconds      Knee/Hip Exercises: Aerobic   Nustep lvl 6 10 mins      Knee/Hip Exercises: Machines for Strengthening   Total Gym Leg Press SL leg press shuttle 3 x 10 each LE 68 lbs      Knee/Hip Exercises: Standing   Lateral Step Up Both;3 sets;10 reps;Step Height: 6"    Other Standing Knee Exercises 18 inch chair eccentric squat control 2 x 10                       PT Long Term Goals - 04/25/20 1041       PT LONG TERM GOAL #1   Title Patient will demonstrate/report pain at worst less than or equal to 2/10 to facilitate minimal limitation in daily activity secondary to pain symptoms.    Status Partially Met      PT LONG TERM GOAL #2   Title Patient will demonstrate independent use of home exercise program to facilitate ability to maintain/progress functional gains from skilled physical therapy services.    Status Achieved      PT LONG TERM GOAL #3   Title Patient will demonstrate return to work/recreational activity at previous level of function without limitations secondary due to condition    Status Achieved      PT LONG TERM GOAL #4   Title Patient will demonstrate bilateral LE MMT 5/5 throughout to facilitate ability to perform usual standing, walking, stairs at PLOF s limitation due to symptoms.    Status Achieved      PT LONG TERM GOAL #5   Title Pt. will demonstrate eccentric step down 6 inch step reciprocal s symptoms to facilitate household navigation s UE assist on stairs.    Status Partially Met      PT LONG TERM GOAL #6   Title Pt. will demonstrate SLS > 30 seconds s aberrant movement to facilitate stability in daily activity.    Status Achieved                 Plan - 04/25/20 1030    Clinical Impression Statement Pt. has attended 6 visits overall course of treatment, reporting GROC +4 moderately better at this time.  Pt. has reported symptoms at worst 3-4/10, noted in Rt knee most recently c stairs descending.  See objective data for updated information showing current presentation.  Pt. has demonstrated improvement in data gathered as well as reduced symptom impact on daily life.  Due to improvements and Pt. understanding on HEP, recommended d/c at this time due to Pt. pleased c current progress recommended.    Personal Factors and  Comorbidities Comorbidity 3+;Other    Comorbidities obesity, HTN, hyperlipidemia, GERD,    Stability/Clinical Decision Making  Stable/Uncomplicated    Rehab Potential Good    PT Frequency 2x / week    PT Duration 6 weeks    PT Treatment/Interventions Joint Manipulations;Spinal Manipulations;Vasopneumatic Device;Taping;Dry needling;Passive range of motion;Neuromuscular re-education;Patient/family education;Manual techniques;Balance training;Therapeutic exercise;Therapeutic activities;Functional mobility training;Stair training;Gait training;Ultrasound;ADLs/Self Care Home Management;Electrical Stimulation;Iontophoresis 90m/ml Dexamethasone;Moist Heat    PT Next Visit Plan D/C to HEP.    PT Home Exercise Plan [id number]    Consulted and Agree with Plan of Care Patient           Patient will benefit from skilled therapeutic intervention in order to improve the following deficits and impairments:  Abnormal gait, Decreased endurance, Hypomobility, Obesity, Decreased activity tolerance, Decreased strength, Impaired UE functional use, Pain, Decreased balance, Decreased mobility, Difficulty walking, Decreased range of motion, Decreased coordination, Impaired flexibility  Visit Diagnosis: Chronic pain of left knee  Chronic pain of right knee  Chronic right shoulder pain  Muscle weakness (generalized)  Difficulty in walking, not elsewhere classified     Problem List Patient Active Problem List   Diagnosis Date Noted  . Bilateral carpal tunnel syndrome 01/17/2018  . Other fatigue 11/27/2017  . Shortness of breath on exertion 11/27/2017  . Vitamin D deficiency 11/27/2017  . PCOS (polycystic ovarian syndrome) 11/27/2017  . Rash and nonspecific skin eruption 03/27/2015  . Facial swelling 03/27/2015  . Swelling of both lips 03/27/2015    PHYSICAL THERAPY DISCHARGE SUMMARY  Visits from Start of Care:6  Current functional level related to goals / functional outcomes: See note  Remaining deficits: See note   Education / Equipment: HEP Plan: Patient agrees to discharge.  Patient goals were met. Patient is  being discharged due to being pleased with the current functional level.  ?????    MScot Jun PT, DPT, OCS, ATC 04/25/20  10:54 AM    [facility] [address] [address]Phone: [phone]  Fax:  [phone] Name: Robin CHARITYMRN: [id number] of Birth: [birth year]

## 2020-04-26 NOTE — Progress Notes (Signed)
Chief Complaint:   OBESITY Robin Knapp is here to discuss her progress with her obesity treatment plan along with follow-up of her obesity related diagnoses. Robin Knapp is on the Category 2 Plan (modified) and states she is following her eating plan approximately 75% of the time. Robin Knapp states she is walking 60 minutes 3-4 times per week.  Today's visit was #: 65 Starting weight: 199 lbs Starting date: 11/27/2017 Today's weight: 193 lbs Today's date: 04/25/2020 Total lbs lost to date: 6 Total lbs lost since last in-office visit: 0  Interim History: Robin Knapp states that her work schedule has been crazy the last 2 weeks. She is not eating enough calories or protein.  Subjective:   Prediabetes. Robin Knapp has a diagnosis of prediabetes based on her elevated HgA1c and was informed this puts her at greater risk of developing diabetes. She continues to work on diet and exercise to decrease her risk of diabetes. She denies nausea or hypoglycemia. Robin Knapp is on metformin daily. No nausea, vomiting, or muscle weakness.   Lab Results  Component Value Date   HGBA1C 5.5 04/05/2020   Lab Results  Component Value Date   INSULIN 12.7 04/05/2020   INSULIN 10.4 09/24/2019   INSULIN 8.2 01/13/2019   INSULIN 8.2 07/16/2018   INSULIN 6.7 03/12/2018   Other hyperlipidemia. Robin Knapp is on no medication. She reports having a strong family history of hyperlipidemia.   Lab Results  Component Value Date   CHOL 218 (H) 04/05/2020   HDL 47 04/05/2020   LDLCALC 160 (H) 04/05/2020   TRIG 65 04/05/2020   Lab Results  Component Value Date   ALT 15 04/05/2020   AST 13 04/05/2020   ALKPHOS 37 (L) 04/05/2020   BILITOT 0.6 04/05/2020   The 10-year ASCVD risk score Robin Knapp) is: 5.1%   Values used to calculate the score:     Age: 48 years     Sex: Female     Is Non-Hispanic African American: Yes     Diabetic: Yes     Tobacco smoker: No     Systolic Blood Pressure: 673 mmHg      Is BP treated: No     HDL Cholesterol: 47 mg/dL     Total Cholesterol: 218 mg/dL  Assessment/Plan:   Prediabetes. Robin Knapp will continue to work on weight loss, exercise, and decreasing simple carbohydrates to help decrease the risk of diabetes. She will continue metformin as directed.   Other hyperlipidemia. Cardiovascular risk and specific lipid/LDL goals reviewed.  We discussed several lifestyle modifications today and Robin Knapp will continue to work on diet, exercise and weight loss efforts. Orders and follow up as documented in patient record. She will speak with her PCP regarding hyperlipidemia and possible medication.  Counseling Intensive lifestyle modifications are the first line treatment for this issue.  Dietary changes: Increase soluble fiber. Decrease simple carbohydrates.  Exercise changes: Moderate to vigorous-intensity aerobic activity 150 minutes per week if tolerated.  Lipid-lowering medications: see documented in medical record.  Class 1 obesity with serious comorbidity and body mass index (BMI) of 32.0 to 32.9 in adult, unspecified obesity type.  Robin Knapp is currently in the action stage of change. As such, her goal is to continue with weight loss efforts. She has agreed to the Category 2 Plan (modified).   Exercise goals: For substantial health benefits, adults should do at least 150 minutes (2 hours and 30 minutes) a week of moderate-intensity, or 75 minutes (1 hour and  15 minutes) a week of vigorous-intensity aerobic physical activity, or an equivalent combination of moderate- and vigorous-intensity aerobic activity. Aerobic activity should be performed in episodes of at least 10 minutes, and preferably, it should be spread throughout the week.  Behavioral modification strategies: increasing lean protein intake and no skipping meals.  Robin Knapp has agreed to follow-up with our clinic in 3 weeks. She was informed of the importance of frequent follow-up visits to maximize  her success with intensive lifestyle modifications for her multiple health conditions.   Objective:   Blood pressure 131/73, pulse 78, temperature 98 F (36.7 C), temperature source Oral, height 5\' 5"  (1.651 m), weight 193 lb (87.5 kg), SpO2 97 %. Body mass index is 32.12 kg/m.  General: Cooperative, alert, well developed, in no acute distress. HEENT: Conjunctivae and lids unremarkable. Cardiovascular: Regular rhythm.  Lungs: Normal work of breathing. Neurologic: No focal deficits.   Lab Results  Component Value Date   CREATININE 0.93 04/05/2020   BUN 17 04/05/2020   NA 141 04/05/2020   K 4.4 04/05/2020   CL 101 04/05/2020   CO2 25 04/05/2020   Lab Results  Component Value Date   ALT 15 04/05/2020   AST 13 04/05/2020   ALKPHOS 37 (L) 04/05/2020   BILITOT 0.6 04/05/2020   Lab Results  Component Value Date   HGBA1C 5.5 04/05/2020   HGBA1C 5.4 09/24/2019   HGBA1C 5.4 01/13/2019   HGBA1C 5.3 07/16/2018   HGBA1C 5.4 03/12/2018   Lab Results  Component Value Date   INSULIN 12.7 04/05/2020   INSULIN 10.4 09/24/2019   INSULIN 8.2 01/13/2019   INSULIN 8.2 07/16/2018   INSULIN 6.7 03/12/2018   Lab Results  Component Value Date   TSH 0.667 11/27/2017   Lab Results  Component Value Date   CHOL 218 (H) 04/05/2020   HDL 47 04/05/2020   LDLCALC 160 (H) 04/05/2020   TRIG 65 04/05/2020   Lab Results  Component Value Date   WBC 8.0 11/27/2017   HGB 13.6 11/27/2017   HCT 38.8 11/27/2017   MCV 86 11/27/2017   PLT 343 04/06/2015   No results found for: IRON, TIBC, FERRITIN  Attestation Statements:   Reviewed by clinician on day of visit: allergies, medications, problem list, medical history, surgical history, family history, social history, and previous encounter notes.  Time spent on visit including pre-visit chart review and post-visit charting and care was 30 minutes.   IMichaelene Song, am acting as transcriptionist for Abby Potash, PA-C   I have reviewed  the above documentation for accuracy and completeness, and I agree with the above. Abby Potash, PA-C

## 2020-05-15 ENCOUNTER — Encounter (INDEPENDENT_AMBULATORY_CARE_PROVIDER_SITE_OTHER): Payer: Self-pay | Admitting: Physician Assistant

## 2020-05-16 ENCOUNTER — Ambulatory Visit (INDEPENDENT_AMBULATORY_CARE_PROVIDER_SITE_OTHER): Payer: No Typology Code available for payment source | Admitting: Physician Assistant

## 2020-05-27 MED FILL — ATORVASTATIN CALCIUM 10 MG: 10 | 90 days supply | Qty: 90 | Fill #0

## 2020-06-03 ENCOUNTER — Ambulatory Visit: Payer: No Typology Code available for payment source | Admitting: Allergy

## 2020-06-03 ENCOUNTER — Encounter: Payer: Self-pay | Admitting: Allergy

## 2020-06-03 ENCOUNTER — Other Ambulatory Visit: Payer: Self-pay

## 2020-06-03 VITALS — BP 130/84 | HR 86 | Temp 98.6°F | Resp 16

## 2020-06-03 DIAGNOSIS — L2489 Irritant contact dermatitis due to other agents: Secondary | ICD-10-CM | POA: Diagnosis not present

## 2020-06-03 DIAGNOSIS — L578 Other skin changes due to chronic exposure to nonionizing radiation: Secondary | ICD-10-CM | POA: Diagnosis not present

## 2020-06-03 DIAGNOSIS — J3089 Other allergic rhinitis: Secondary | ICD-10-CM

## 2020-06-03 DIAGNOSIS — J4530 Mild persistent asthma, uncomplicated: Secondary | ICD-10-CM | POA: Diagnosis not present

## 2020-06-03 MED ORDER — ALBUTEROL SULFATE HFA 108 (90 BASE) MCG/ACT IN AERS: 2.0000 | INHALATION_SPRAY | RESPIRATORY_TRACT | 1 refills | Status: DC | PRN

## 2020-06-03 MED FILL — ALBUTEROL SULFATE HFA 108 (: 108 (90 BAS | 16 days supply | Qty: 18 | Fill #0

## 2020-06-03 NOTE — Patient Instructions (Addendum)
Allergic rinitis  - continue avoidance measures for grass pollen, weed pollen, tree pollen, mold, dust mite, cat - continue Allegra 180mg  daily as needed - continue Flonase 2 sprays each nostril daily.  If worsened congestion can use twice a day for several days until improved then decrease back to daily use.  - use nasal saline spray and blow nose well prior to use of medicated nasal sprays - allergen immunotherapy has been discussed including protocol, benefits and risk if medication management is not effective.     Mild persistent asthma - have access to albuterol inhaler 2 puffs every 4-6 hours as needed for cough/wheeze/shortness of breath/chest tightness.  May use 15-20 minutes prior to activity.   Monitor frequency of use.    Contact dermatitis/solar dermatitis - for known sun exposure recommend doubling your Allegra dose (2 tabs) and also take Pepcid 20-40mg  to help minimize your antihistamine response - can perform patch testing to TRUE test patch panel (see below) if wanting to determine what you are allergic to with skin contact.  If you still have body products that have causes dermatitis after use you can bring those in and patch to those as well.  Otherwise would avoid products that cause rash and see which ingredients are similar among the products.    Follow-up 6 months or sooner if needed

## 2020-06-03 NOTE — Progress Notes (Signed)
Follow-up Note  RE: Robin Knapp MRN: [id number] DOB: June 27, 1971 Date of Office Visit: 06/03/2020   History of present illness: Robin Knapp is a 49 y.o. female presenting today for follow-up of allergic rhinitis, asthma and contact dermatitis/allergic dermatitis.  She was last seen in the office on 12/02/2019 by myself.  She states she has been doing well since her last visit without any major health changes, surgeries or hospitalizations.  She states that the Dymista was not tolerable due to the taste.  She has been using Flonase 2 sprays daily as well as taking Allegra.  This has been controlling her symptoms well.  She has not needed to use any albuterol at all since her last visit.  She has not had any further rash since last visit however notes that she hasn't been outside in the sun much either and has not needed to use sunscreen.  She is still interested in patch testing. States she was patched tested some years ago and was positive to thimerosol.  She feels likely contact allergic to more products as she does not tolerate use of most detergents. Using all free and clear with issue.   Review of systems: Review of Systems  Constitutional: Negative.   HENT: Negative.   Eyes: Negative.   Respiratory: Negative.   Cardiovascular: Negative.   Gastrointestinal: Negative.   Musculoskeletal: Negative.   Skin: Negative.   Neurological: Negative.     All other systems negative unless noted above in HPI  Past medical/social/surgical/family history have been reviewed and are unchanged unless specifically indicated below.  No changes  Medication List: Current Outpatient Medications  Medication Sig Dispense Refill  . acetaminophen (TYLENOL 8 HOUR ARTHRITIS PAIN) 650 MG CR tablet Take 650 mg by mouth every 8 (eight) hours as needed for pain.    [name] albuterol (VENTOLIN HFA) 108 (90 Base) MCG/ACT inhaler Inhale 2 puffs into the lungs every 4 (four) hours as needed for wheezing or  shortness of breath. 18 g 1  . Cholecalciferol ([employer] VITAMIN D3) 10000 units CAPS Take by mouth.    . fexofenadine (ALLEGRA) 180 MG tablet Take 180 mg by mouth daily.     . Fluticasone Furoate 50 MCG/ACT AEPB Inhale 2 sprays into the lungs daily.    [name] ibuprofen (ADVIL,MOTRIN) 600 MG tablet Take 600 mg by mouth every 6 (six) hours as needed.    [name] levonorgestrel (MIRENA) 20 MCG/24HR IUD 1 each by Intrauterine route once.    . Magnesium 250 MG TABS Take 1 tablet by mouth daily.    . metFORMIN (GLUCOPHAGE-XR) 500 MG 24 hr tablet Take 1 tablet (500 mg total) by mouth daily with breakfast. 30 tablet 0  . naproxen (NAPROSYN) 250 MG tablet Take 500 mg by mouth 2 (two) times daily with a meal.     No current facility-administered medications for this visit.     Known medication allergies: Allergies  Allergen Reactions  . Sulfa Antibiotics Hives    itching  . Sunscreens      Physical examination: Blood pressure 130/84, pulse 86, temperature 98.6 F (37 C), temperature source Temporal, resp. rate 16, SpO2 98 %.  General: Alert, interactive, in no acute distress. HEENT: PERRLA, TMs pearly gray, turbinates mildly edematous without discharge L>R, post-pharynx non erythematous. Neck: Supple without lymphadenopathy. Lungs: Clear to auscultation without wheezing, rhonchi or rales. {no increased work of breathing. CV: Normal S1, S2 without murmurs. Abdomen: Nondistended, nontender. Skin: Warm and dry, without lesions or rashes. Extremities:  No clubbing, cyanosis or edema. Neuro:   Grossly intact.  Diagnositics/Labs: None today  Assessment and plan: Allergic rinitis  - continue avoidance measures for grass pollen, weed pollen, tree pollen, mold, dust mite, cat - continue Allegra 180mg  daily as needed - continue Flonase 2 sprays each nostril daily.  If worsened congestion can use twice a day for several days until improved then decrease back to daily use.  - use nasal saline spray and blow  nose well prior to use of medicated nasal sprays - allergen immunotherapy has been discussed including protocol, benefits and risk if medication management is not effective.     Mild persistent asthma - have access to albuterol inhaler 2 puffs every 4-6 hours as needed for cough/wheeze/shortness of breath/chest tightness.  May use 15-20 minutes prior to activity.   Monitor frequency of use.    Contact dermatitis/solar dermatitis - for known sun exposure recommend doubling your Allegra dose (2 tabs) and also take Pepcid 20-40mg  to help minimize your antihistamine response - can perform patch testing to TRUE test patch panel (see below) if wanting to determine what you are allergic to with skin contact.  If you still have body products that have causes dermatitis after use you can bring those in and patch to those as well.  Otherwise would avoid products that cause rash and see which ingredients are similar among the products.    Follow-up 6 months or sooner if needed  I appreciate the opportunity to take part in Anona's care. Please do not hesitate to contact me with questions.  Sincerely,   Prudy Feeler, MD Allergy/Immunology [facility]

## 2020-06-13 ENCOUNTER — Ambulatory Visit (INDEPENDENT_AMBULATORY_CARE_PROVIDER_SITE_OTHER): Payer: No Typology Code available for payment source | Admitting: Physician Assistant

## 2020-06-13 ENCOUNTER — Other Ambulatory Visit: Payer: Self-pay

## 2020-06-13 ENCOUNTER — Encounter (INDEPENDENT_AMBULATORY_CARE_PROVIDER_SITE_OTHER): Payer: Self-pay | Admitting: Physician Assistant

## 2020-06-13 VITALS — BP 129/83 | HR 71 | Temp 98.2°F | Ht 65.0 in | Wt 197.0 lb

## 2020-06-13 DIAGNOSIS — E669 Obesity, unspecified: Secondary | ICD-10-CM

## 2020-06-13 DIAGNOSIS — Z6832 Body mass index (BMI) 32.0-32.9, adult: Secondary | ICD-10-CM

## 2020-06-13 DIAGNOSIS — E7849 Other hyperlipidemia: Secondary | ICD-10-CM

## 2020-06-13 DIAGNOSIS — R7303 Prediabetes: Secondary | ICD-10-CM | POA: Diagnosis not present

## 2020-06-13 DIAGNOSIS — Z9189 Other specified personal risk factors, not elsewhere classified: Secondary | ICD-10-CM | POA: Diagnosis not present

## 2020-06-13 MED ORDER — METFORMIN HCL ER 500 MG PO TB24: 500.0000 mg | ORAL_TABLET | Freq: Every day | ORAL | 0 refills | Status: DC

## 2020-06-13 MED FILL — METFORMIN HCL ER 500 MG TB2: 500 | 30 days supply | Qty: 30 | Fill #0

## 2020-06-13 NOTE — Progress Notes (Signed)
Chief Complaint:   OBESITY Robin Knapp is here to discuss her progress with her obesity treatment plan along with follow-up of her obesity related diagnoses. Robin Knapp is on the Category 2 Plan and states she is following her eating plan approximately 50% of the time. Robin Knapp states she is doing cardio for 60-120 minutes 3 times per week.  Today's visit was #: 79 Starting weight: 199 lbs Starting date: 11/27/2017 Today's weight: 197 lbs Today's date: 06/13/2020 Total lbs lost to date: 2 Total lbs lost since last in-office visit: 0  Interim History: Robin Knapp has been very stressed with work. She has been skipping meals as she does not have time to eat at work and often does not have an appetite when she gets home.  Subjective:   1. Other hyperlipidemia Robin Knapp is on Lipitor, and is tolerating it well.  2. Pre-diabetes Robin Knapp on metformin, and lacks hunger cues.  3. At increased risk of exposure to COVID-19 virus The patient is at higher risk of COVID-19 infection due to higher infection rates locally.  Assessment/Plan:   1. Other hyperlipidemia Cardiovascular risk and specific lipid/LDL goals reviewed. We discussed several lifestyle modifications today. Robin Knapp will continue with her medications, and continue to work on diet, exercise and weight loss efforts. We will recheck labs in 3 months. Orders and follow up as documented in patient record.   Counseling Intensive lifestyle modifications are the first line treatment for this issue. . Dietary changes: Increase soluble fiber. Decrease simple carbohydrates. . Exercise changes: Moderate to vigorous-intensity aerobic activity 150 minutes per week if tolerated. . Lipid-lowering medications: see documented in medical record.  2. Pre-diabetes Robin Knapp will continue to work on weight loss, exercise, and decreasing simple carbohydrates to help decrease the risk of diabetes. We will refill metformin for 1 month.  - metFORMIN  (GLUCOPHAGE-XR) 500 MG 24 hr tablet; Take 1 tablet (500 mg total) by mouth daily with breakfast.  Dispense: 30 tablet; Refill: 0  3. At increased risk of exposure to COVID-19 virus Robin Knapp was given approximately 15 minutes of COVID prevention counseling today Counseling  COVID-19 is a respiratory infection that is caused by a virus. It can cause serious infections, such as pneumonia, acute respiratory distress syndrome, acute respiratory failure, or sepsis.  You are more likely to develop a serious illness if you are 60 years of age or older, have a weak immune system, live in a nursing home, have chronic disease, or have obesity.  Get vaccinated as soon as they are available to you.  For our most current information, please visit [email or web address].  Wash your hands often with soap and water for 20 seconds. If soap and water are not available, use alcohol-based hand sanitizer.  Wear a face mask. Make sure your mask covers your nose and mouth.  Maintain at least 6 feet distance from others when in public.  Get help right away if  You have trouble breathing, chest pain, confusion, or other concerning symptoms.  Repetitive spaced learning was employed today to elicit superior memory formation and behavioral change.  4. Class 1 obesity with serious comorbidity and body mass index (BMI) of 32.0 to 32.9 in adult, unspecified obesity type Robin Knapp is currently in the action stage of change. As such, her goal is to continue with weight loss efforts. She has agreed to the Category 2 Plan + 200-300 protein calories.   Exercise goals: As is.  Behavioral modification strategies: increasing lean protein intake and no skipping meals.  Robin Knapp has  agreed to follow-up with our clinic in 2 weeks. She was informed of the importance of frequent follow-up visits to maximize her success with intensive lifestyle modifications for her multiple health conditions.   Objective:   Blood pressure  129/83, pulse 71, temperature 98.2 F (36.8 C), height 5\' 5"  (1.651 m), weight 197 lb (89.4 kg), SpO2 98 %. Body mass index is 32.78 kg/m.  General: Cooperative, alert, well developed, in no acute distress. HEENT: Conjunctivae and lids unremarkable. Cardiovascular: Regular rhythm.  Lungs: Normal work of breathing. Neurologic: No focal deficits.   Lab Results  Component Value Date   CREATININE 0.93 04/05/2020   BUN 17 04/05/2020   NA 141 04/05/2020   K 4.4 04/05/2020   CL 101 04/05/2020   CO2 25 04/05/2020   Lab Results  Component Value Date   ALT 15 04/05/2020   AST 13 04/05/2020   ALKPHOS 37 (L) 04/05/2020   BILITOT 0.6 04/05/2020   Lab Results  Component Value Date   HGBA1C 5.5 04/05/2020   HGBA1C 5.4 09/24/2019   HGBA1C 5.4 01/13/2019   HGBA1C 5.3 07/16/2018   HGBA1C 5.4 03/12/2018   Lab Results  Component Value Date   INSULIN 12.7 04/05/2020   INSULIN 10.4 09/24/2019   INSULIN 8.2 01/13/2019   INSULIN 8.2 07/16/2018   INSULIN 6.7 03/12/2018   Lab Results  Component Value Date   TSH 0.667 11/27/2017   Lab Results  Component Value Date   CHOL 218 (H) 04/05/2020   HDL 47 04/05/2020   LDLCALC 160 (H) 04/05/2020   TRIG 65 04/05/2020   Lab Results  Component Value Date   WBC 8.0 11/27/2017   HGB 13.6 11/27/2017   HCT 38.8 11/27/2017   MCV 86 11/27/2017   PLT 343 04/06/2015   No results found for: IRON, TIBC, FERRITIN  Attestation Statements:   Reviewed by clinician on day of visit: allergies, medications, problem list, medical history, surgical history, family history, social history, and previous encounter notes.   Wilhemena Durie, am acting as transcriptionist for [employer], PA-C.  I have reviewed the above documentation for accuracy and completeness, and I agree with the above. Abby Potash, PA-C

## 2020-06-27 ENCOUNTER — Other Ambulatory Visit: Payer: Self-pay

## 2020-06-27 ENCOUNTER — Ambulatory Visit: Payer: No Typology Code available for payment source | Admitting: Family

## 2020-06-27 ENCOUNTER — Encounter: Payer: Self-pay | Admitting: Family

## 2020-06-27 VITALS — BP 124/86 | HR 82 | Temp 98.2°F | Resp 16 | Ht 65.0 in | Wt 201.8 lb

## 2020-06-27 DIAGNOSIS — L2489 Irritant contact dermatitis due to other agents: Secondary | ICD-10-CM

## 2020-06-27 NOTE — Progress Notes (Signed)
Follow-up Note  RE: Robin Knapp MRN: [id number] DOB: [birth year] Date of Office Visit: 06/27/2020  Primary care provider: Vernie Shanks, MD Referring provider: Vernie Shanks, MD   Robin Knapp returns to the office today for the patch test placement, given suspected history of contact dermatitis.    Diagnostics: True Test patches placed.    Plan:   Allergic contact dermatitis - Instructions provided on care of the patches for the next 48 hours. - Robin Knapp was instructed to avoid showering for the next 48 hours. - Robin Knapp will follow up in 48 hours and 96 hours for patch readings.  Thank you for the opportunity to care for Robin Knapp. Please let us know if you have any questions.  Althea Charon, FNP [facility]

## 2020-06-29 ENCOUNTER — Encounter: Payer: Self-pay | Admitting: Allergy

## 2020-06-29 ENCOUNTER — Ambulatory Visit: Payer: No Typology Code available for payment source | Admitting: Allergy

## 2020-06-29 ENCOUNTER — Other Ambulatory Visit: Payer: Self-pay

## 2020-06-29 DIAGNOSIS — L2489 Irritant contact dermatitis due to other agents: Secondary | ICD-10-CM

## 2020-06-29 NOTE — Progress Notes (Signed)
    Follow-up Note  RE: AIDE WOJNAR MRN: [id number] DOB: 09/23/1970 Date of Office Visit: 06/29/2020  Primary care provider: Vernie Shanks, MD Referring provider: Vernie Shanks, MD   Calise returns to the office today for the initial patch test interpretation, given suspected history of contact dermatitis.    Diagnostics:  TRUE TEST 48 hour reading: #6 fragrance mix with edematous and slightly erythematous square- 1+  Plan:  Allergic contact dermatitis  - pt to return in 2 days for final read - provided with informational handout on component above so she can best identify products and avoid use -We will provide with a safety list after Friday reading to be sent to email: [email or web address]  Prudy Feeler, MD [facility]

## 2020-07-01 ENCOUNTER — Other Ambulatory Visit: Payer: Self-pay

## 2020-07-01 ENCOUNTER — Encounter: Payer: Self-pay | Admitting: Family

## 2020-07-01 ENCOUNTER — Ambulatory Visit: Payer: No Typology Code available for payment source | Admitting: Family

## 2020-07-01 DIAGNOSIS — L2489 Irritant contact dermatitis due to other agents: Secondary | ICD-10-CM

## 2020-07-01 NOTE — Progress Notes (Addendum)
Robin Knapp returns to the office today for the final patch test interpretation, given suspected history of contact dermatitis.    Diagnostics:   TRUE TEST 96-hour hour reading: negative reaction to #1 (Nickel Sulfate), negative reaction to #2 (Wool Alcohols), negative reaction to #3 (Neomycin sulfate), negative reaction to #4 (Potassium dichromate), negative reaction to #5 [employer] mix), slightly edematous with no erythema- Doubtful  reaction to  #6 (Fragrance mix), negative reaction to #7 (Colophony), negative reaction to #9 (Paraben mix), negative reaction to #10 (Balsam of [place]), negative reaction to #11 (Ethylenediamine dihydrochloride), negative reaction to #12 (Cobalt chloride), negative reaction to #13 (p-tert Butylphenol formaldehyde resin), negative reaction to #14 (Epoxy resin), negative reaction to #15 (Carba mix), negative reaction to #16 (Black rubber mix), negative reaction to #17 (Cl+Me-Isothiazolinone), negative reaction to #18 (Quaternium-15), negative reaction to #19 (Methyldibromo Glutaronitrile), negative reaction to #20 (p-Phenylene-diamine), negative reaction to #21 (Formaldehyde), negative reaction to #22 (Mercapto mix), 1+  reaction to #23 (Thiomersal), negative reaction to #24 (Thiuram mix), negative reaction to #25 (Diazolidinyl urea), negative reaction to #26 (Quinoline mix), negative reaction to #27 (Tixocortol-21-pivalate), negative reaction to #28 (Gold sodium thiosulfate), negative reaction to #29 (Imidazolidinyl urea), negative reaction to #30 (Budesonide), negative reaction to #31 (Hydrocortisone-17-butyrate), negative reaction to #32 (Mercapto-benzothiazole), negative reaction to #33 (Bacitracin), negative reaction to #34 (Parthenolide), negative reaction to #35 (Disperse blue) and negative reaction to #36 (Bronopol)  Plan:   Allergic contact dermatitis - The patient has been provided detailed information regarding the substances she is sensitive to, as well as products  containing the substances.   - Meticulous avoidance of these substances is recommended.  - If avoidance is not possible, the use of barrier creams or lotions is recommended.   Thank you for the opportunity to care for Robin Knapp. Please let us know if this treatment plan is not working well for you.  Althea Charon, FNP [facility] ----------------------------------------------------------- Attestation: I reviewed the Nurse Practitioner's note and agree with the documented findings and plan of care. We discussed the patient and developed a plan concurrently.   Prudy Feeler, MD [facility] [place]

## 2020-07-05 ENCOUNTER — Other Ambulatory Visit: Payer: Self-pay

## 2020-07-05 ENCOUNTER — Encounter (INDEPENDENT_AMBULATORY_CARE_PROVIDER_SITE_OTHER): Payer: Self-pay | Admitting: Physician Assistant

## 2020-07-05 ENCOUNTER — Ambulatory Visit (INDEPENDENT_AMBULATORY_CARE_PROVIDER_SITE_OTHER): Payer: No Typology Code available for payment source | Admitting: Physician Assistant

## 2020-07-05 VITALS — BP 120/82 | HR 75 | Temp 98.0°F | Ht 65.0 in | Wt 199.0 lb

## 2020-07-05 DIAGNOSIS — Z6833 Body mass index (BMI) 33.0-33.9, adult: Secondary | ICD-10-CM | POA: Diagnosis not present

## 2020-07-05 DIAGNOSIS — Z9189 Other specified personal risk factors, not elsewhere classified: Secondary | ICD-10-CM | POA: Diagnosis not present

## 2020-07-05 DIAGNOSIS — E559 Vitamin D deficiency, unspecified: Secondary | ICD-10-CM | POA: Diagnosis not present

## 2020-07-05 DIAGNOSIS — E669 Obesity, unspecified: Secondary | ICD-10-CM

## 2020-07-05 DIAGNOSIS — R7303 Prediabetes: Secondary | ICD-10-CM

## 2020-07-06 ENCOUNTER — Other Ambulatory Visit (INDEPENDENT_AMBULATORY_CARE_PROVIDER_SITE_OTHER): Payer: Self-pay | Admitting: Physician Assistant

## 2020-07-06 MED ORDER — METFORMIN HCL ER 500 MG PO TB24: 500.0000 mg | ORAL_TABLET | Freq: Every day | ORAL | 0 refills | Status: DC

## 2020-07-06 NOTE — Progress Notes (Signed)
Chief Complaint:   OBESITY Robin Knapp is here to discuss her progress with her obesity treatment plan along with follow-up of her obesity related diagnoses. Robin Knapp is on the Category 2 Plan (not journaling) and states she is following her eating plan approximately 50% of the time. Robin Knapp states she is doing weights/cardio 60 minutes 2-3 times per week.  Today's visit was #: 6 Starting weight: 199 lbs Starting date: 11/27/2017 Today's weight: 199 lbs Today's date: 07/05/2020 Total lbs lost to date: 0 Total lbs lost since last in-office visit: 0  Interim History: Robin Knapp reports that she has lost motivation and is eating whatever she wants. She has not been journaling.  Subjective:   Prediabetes. Robin Knapp has a diagnosis of prediabetes based on her elevated HgA1c and was informed this puts her at greater risk of developing diabetes. She continues to work on diet and exercise to decrease her risk of diabetes. She denies nausea or hypoglycemia. Robin Knapp is on metformin and denies polyphagia.  Lab Results  Component Value Date   HGBA1C 5.5 04/05/2020   Lab Results  Component Value Date   INSULIN 12.7 04/05/2020   INSULIN 10.4 09/24/2019   INSULIN 8.2 01/13/2019   INSULIN 8.2 07/16/2018   INSULIN 6.7 03/12/2018   Vitamin D deficiency. [name] is on OTC Vitamin D supplementation. Last level was at goal.   Ref. Range 04/05/2020 14:36  Vitamin D, 25-Hydroxy Latest Ref Range: 30.0 - 100.0 ng/mL 58.2   At risk for diabetes mellitus. Robin Knapp is at higher than average risk for developing diabetes due to her obesity.   Assessment/Plan:   Prediabetes. Robin Knapp will continue to work on weight loss, exercise, and decreasing simple carbohydrates to help decrease the risk of diabetes. Refill was given for metFORMIN (GLUCOPHAGE-XR) 500 MG 24 hr tablet #30 with 0 refills.  Vitamin D deficiency. Low Vitamin D level contributes to fatigue and are associated with obesity, breast, and  colon cancer. She agrees to continue to take OTC Vitamin D as directed and will follow-up for routine testing of Vitamin D, at least 2-3 times per year to avoid over-replacement.  At risk for diabetes mellitus. Robin Knapp was given approximately 15 minutes of diabetes education and counseling today. We discussed intensive lifestyle modifications today with an emphasis on weight loss as well as increasing exercise and decreasing simple carbohydrates in her diet. We also reviewed medication options with an emphasis on risk versus benefit of those discussed.   Repetitive spaced learning was employed today to elicit superior memory formation and behavioral change.  Class 1 obesity with serious comorbidity and body mass index (BMI) of 33.0 to 33.9 in adult, unspecified obesity type.  Robin Knapp is currently in the action stage of change. As such, her goal is to continue with weight loss efforts. She has agreed to keeping a food journal and adhering to recommended goals of 1200-1400 calories and 90 grams of protein daily.  Labs will be checked next visit as well as IC.   Exercise goals: For substantial health benefits, adults should do at least 150 minutes (2 hours and 30 minutes) a week of moderate-intensity, or 75 minutes (1 hour and 15 minutes) a week of vigorous-intensity aerobic physical activity, or an equivalent combination of moderate- and vigorous-intensity aerobic activity. Aerobic activity should be performed in episodes of at least 10 minutes, and preferably, it should be spread throughout the week.  Behavioral modification strategies: increasing lean protein intake and no skipping meals.  Robin Knapp has agreed to  follow-up with our clinic in 2 weeks. She was informed of the importance of frequent follow-up visits to maximize her success with intensive lifestyle modifications for her multiple health conditions.   Objective:   Blood pressure 120/82, pulse 75, temperature 98 F (36.7 C), height 5\' 5"   (1.651 m), weight 199 lb (90.3 kg), SpO2 100 %. Body mass index is 33.12 kg/m.  General: Cooperative, alert, well developed, in no acute distress. HEENT: Conjunctivae and lids unremarkable. Cardiovascular: Regular rhythm.  Lungs: Normal work of breathing. Neurologic: No focal deficits.   Lab Results  Component Value Date   CREATININE 0.93 04/05/2020   BUN 17 04/05/2020   NA 141 04/05/2020   K 4.4 04/05/2020   CL 101 04/05/2020   CO2 25 04/05/2020   Lab Results  Component Value Date   ALT 15 04/05/2020   AST 13 04/05/2020   ALKPHOS 37 (L) 04/05/2020   BILITOT 0.6 04/05/2020   Lab Results  Component Value Date   HGBA1C 5.5 04/05/2020   HGBA1C 5.4 09/24/2019   HGBA1C 5.4 01/13/2019   HGBA1C 5.3 07/16/2018   HGBA1C 5.4 03/12/2018   Lab Results  Component Value Date   INSULIN 12.7 04/05/2020   INSULIN 10.4 09/24/2019   INSULIN 8.2 01/13/2019   INSULIN 8.2 07/16/2018   INSULIN 6.7 03/12/2018   Lab Results  Component Value Date   TSH 0.667 11/27/2017   Lab Results  Component Value Date   CHOL 218 (H) 04/05/2020   HDL 47 04/05/2020   LDLCALC 160 (H) 04/05/2020   TRIG 65 04/05/2020   Lab Results  Component Value Date   WBC 8.0 11/27/2017   HGB 13.6 11/27/2017   HCT 38.8 11/27/2017   MCV 86 11/27/2017   PLT 343 04/06/2015   No results found for: IRON, TIBC, FERRITIN  Attestation Statements:   Reviewed by clinician on day of visit: allergies, medications, problem list, medical history, surgical history, family history, social history, and previous encounter notes.  IMichaelene Song, am acting as transcriptionist for Abby Potash, PA-C   I have reviewed the above documentation for accuracy and completeness, and I agree with the above. Abby Potash, PA-C

## 2020-07-07 ENCOUNTER — Telehealth: Payer: Self-pay

## 2020-07-07 ENCOUNTER — Other Ambulatory Visit (HOSPITAL_COMMUNITY): Payer: Self-pay | Admitting: Internal Medicine

## 2020-07-07 NOTE — Telephone Encounter (Signed)
Robin Knapp Noland Fordyce, MD 12 hours ago (7:54 PM)   Hi, Dr. Nelva Bush! I was wondering if I was supposed to receive an email with the products that are free of the allergens identified during my patch test. If they have been sent, I have not received . Could they be resent to my email address? [email or web address] Thank you! Robin Knapp

## 2020-07-07 NOTE — Telephone Encounter (Signed)
Sent to both work and Surveyor, quantity.  Let us know if do not receive.

## 2020-07-07 NOTE — Telephone Encounter (Signed)
Sent my chart message to pt stating we sent it to both work and personal waiting on response from pt now.

## 2020-07-08 MED FILL — FLUARIX QUADRIVALENT 0.5 ML: 0.5 | 1 days supply | Qty: 1 | Fill #0

## 2020-07-11 ENCOUNTER — Ambulatory Visit: Payer: No Typology Code available for payment source | Attending: Internal Medicine

## 2020-07-11 DIAGNOSIS — Z23 Encounter for immunization: Secondary | ICD-10-CM

## 2020-07-11 NOTE — Progress Notes (Signed)
   Covid-19 Vaccination Clinic  Name:  Robin Knapp    MRN: [id number] DOB: Feb 15, 1971  07/11/2020  [name] was observed post Covid-19 immunization for 15 minutes without incident. She was provided with Vaccine Information Sheet and instruction to access the V-Safe system.   [name] was instructed to call 911 with any severe reactions post vaccine: [name] Difficulty breathing  . Swelling of face and throat  . A fast heartbeat  . A bad rash all over body  . Dizziness and weakness      Covid-19 Vaccination Clinic  Name:  Robin Knapp    MRN: [id number] DOB: Feb 14, 1971  07/11/2020  [name] was observed post Covid-19 immunization for 15 minutes without incident. She was provided with Vaccine Information Sheet and instruction to access the V-Safe system.   [name] was instructed to call 911 with any severe reactions post vaccine: [name] Difficulty breathing  . Swelling of face and throat  . A fast heartbeat  . A bad rash all over body  . Dizziness and weakness

## 2020-07-19 ENCOUNTER — Other Ambulatory Visit: Payer: Self-pay

## 2020-07-19 ENCOUNTER — Ambulatory Visit (INDEPENDENT_AMBULATORY_CARE_PROVIDER_SITE_OTHER): Payer: No Typology Code available for payment source | Admitting: Physician Assistant

## 2020-07-19 ENCOUNTER — Encounter (INDEPENDENT_AMBULATORY_CARE_PROVIDER_SITE_OTHER): Payer: Self-pay | Admitting: Physician Assistant

## 2020-07-19 VITALS — BP 132/83 | HR 68 | Temp 97.9°F | Ht 65.0 in | Wt 196.0 lb

## 2020-07-19 DIAGNOSIS — Z9189 Other specified personal risk factors, not elsewhere classified: Secondary | ICD-10-CM

## 2020-07-19 DIAGNOSIS — E559 Vitamin D deficiency, unspecified: Secondary | ICD-10-CM | POA: Diagnosis not present

## 2020-07-19 DIAGNOSIS — R7303 Prediabetes: Secondary | ICD-10-CM

## 2020-07-19 DIAGNOSIS — E669 Obesity, unspecified: Secondary | ICD-10-CM

## 2020-07-19 DIAGNOSIS — Z6832 Body mass index (BMI) 32.0-32.9, adult: Secondary | ICD-10-CM

## 2020-07-19 DIAGNOSIS — R0602 Shortness of breath: Secondary | ICD-10-CM

## 2020-07-19 DIAGNOSIS — E7849 Other hyperlipidemia: Secondary | ICD-10-CM

## 2020-07-19 NOTE — Progress Notes (Signed)
Chief Complaint:   OBESITY Robin Knapp is here to discuss her progress with her obesity treatment plan along with follow-up of her obesity related diagnoses. Robin Knapp is keeping a food journal and adhering to recommended goals of 1200-1400 calories and 90 grams of protein and states she is following her eating plan approximately 90% of the time. Robin Knapp states she is doing weights/cardio 30 minutes 2-3 times per week.  Today's visit was #: 77 Starting weight: 199 lbs Starting date: 11/27/2017 Today's weight: 196 lbs Today's date: 07/19/2020 Total lbs lost to date: 3 Total lbs lost since last in-office visit: 3  Interim History: Robin Knapp states that she has been meal planning and prepping better and reports hitting her calorie and protein goals. She states her birthday is coming up.  Subjective:   SOB (shortness of breath). Robin Knapp reports shortness of breath on exertion. No dizziness or lightheadedness.  Other hyperlipidemia. Robin Knapp is on Crestor. No chest pain or myalgias.    Lab Results  Component Value Date   CHOL 218 (H) 04/05/2020   HDL 47 04/05/2020   LDLCALC 160 (H) 04/05/2020   TRIG 65 04/05/2020   Lab Results  Component Value Date   ALT 15 04/05/2020   AST 13 04/05/2020   ALKPHOS 37 (L) 04/05/2020   BILITOT 0.6 04/05/2020   The 10-year ASCVD risk score [name], et al., 2013) is: 5.2%   Values used to calculate the score:     Age: 9 years     Sex: Female     Is Non-Hispanic African American: Yes     Diabetic: Yes     Tobacco smoker: No     Systolic Blood Pressure: 448 mmHg     Is BP treated: No     HDL Cholesterol: 47 mg/dL     Total Cholesterol: 218 mg/dL  Prediabetes. Robin Knapp has a diagnosis of prediabetes based on her elevated HgA1c and was informed this puts her at greater risk of developing diabetes. She continues to work on diet and exercise to decrease her risk of diabetes. Robin Knapp is on metformin. No nausea, vomiting, or diarrhea.    Lab Results  Component Value Date   HGBA1C 5.5 04/05/2020   Lab Results  Component Value Date   INSULIN 12.7 04/05/2020   INSULIN 10.4 09/24/2019   INSULIN 8.2 01/13/2019   INSULIN 8.2 07/16/2018   INSULIN 6.7 03/12/2018   Vitamin D deficiency. Robin Knapp is on Vitamin D supplementation. No nausea, vomiting, or muscle weakness.    Ref. Range 04/05/2020 14:36  Vitamin D, 25-Hydroxy Latest Ref Range: 30.0 - 100.0 ng/mL 58.2   At risk for heart disease. Robin Knapp is at a higher than average risk for cardiovascular disease due to obesity.   Assessment/Plan:   SOB (shortness of breath). Robin Knapp's shortness of breath appears to be obesity related and exercise induced. She has agreed to work on weight loss and gradually increase exercise to treat her exercise induced shortness of breath. Will continue to monitor closely. IC checked today showing RMR of 1752.  Other hyperlipidemia.  Cardiovascular risk and specific lipid/LDL goals reviewed.  We discussed several lifestyle modifications today and Robin Knapp will continue to work on diet, exercise and weight loss efforts. Orders and follow up as documented in patient record. She will continue her medication as directed. Labs will be checked today.   Counseling Intensive lifestyle modifications are the first line treatment for this issue. . Dietary changes: Increase soluble fiber. Decrease simple carbohydrates. [name]  Exercise changes: Moderate to vigorous-intensity aerobic activity 150 minutes per week if tolerated.  . Lipid-lowering medications: see documented in medical record.  Prediabetes. Robin Knapp will continue to work on weight loss, exercise, and decreasing simple carbohydrates to help decrease the risk of diabetes. She will continue her medication as directed. Labs will be checked today.   Vitamin D deficiency. Low Vitamin D level contributes to fatigue and are associated with obesity, breast, and colon cancer. She agrees to continue to take  Vitamin D as directed and VITAMIN D 25 Hydroxy (Vit-D Deficiency, Fractures) level will be checked today.   At risk for heart disease. Robin Knapp was given approximately 15 minutes of coronary artery disease prevention counseling today. She is 49 y.o. female and has risk factors for heart disease including obesity. We discussed intensive lifestyle modifications today with an emphasis on specific weight loss instructions and strategies.   Repetitive spaced learning was employed today to elicit superior memory formation and behavioral change.  Class 1 obesity with serious comorbidity and body mass index (BMI) of 32.0 to 32.9 in adult, unspecified obesity type.  Robin Knapp is currently in the action stage of change. As such, her goal is to continue with weight loss efforts. She has agreed to keeping a food journal and adhering to recommended goals of 1200-1400 calories and 90 grams of protein.   Exercise goals: For substantial health benefits, adults should do at least 150 minutes (2 hours and 30 minutes) a week of moderate-intensity, or 75 minutes (1 hour and 15 minutes) a week of vigorous-intensity aerobic physical activity, or an equivalent combination of moderate- and vigorous-intensity aerobic activity. Aerobic activity should be performed in episodes of at least 10 minutes, and preferably, it should be spread throughout the week.  Behavioral modification strategies: no skipping meals and meal planning and cooking strategies.  Robin Knapp has agreed to follow-up with our clinic in 2-3 weeks. She was informed of the importance of frequent follow-up visits to maximize her success with intensive lifestyle modifications for her multiple health conditions.   Robin Knapp was informed we would discuss her lab results at her next visit unless there is a critical issue that needs to be addressed sooner. Robin Knapp agreed to keep her next visit at the agreed upon time to discuss these results.  Objective:   Blood  pressure 132/83, pulse 68, temperature 97.9 F (36.6 C), height 5\' 5"  (1.651 m), weight 196 lb (88.9 kg), SpO2 98 %. Body mass index is 32.62 kg/m.  General: Cooperative, alert, well developed, in no acute distress. HEENT: Conjunctivae and lids unremarkable. Cardiovascular: Regular rhythm.  Lungs: Normal work of breathing. Neurologic: No focal deficits.   Lab Results  Component Value Date   CREATININE 0.93 04/05/2020   BUN 17 04/05/2020   NA 141 04/05/2020   K 4.4 04/05/2020   CL 101 04/05/2020   CO2 25 04/05/2020   Lab Results  Component Value Date   ALT 15 04/05/2020   AST 13 04/05/2020   ALKPHOS 37 (L) 04/05/2020   BILITOT 0.6 04/05/2020   Lab Results  Component Value Date   HGBA1C 5.5 04/05/2020   HGBA1C 5.4 09/24/2019   HGBA1C 5.4 01/13/2019   HGBA1C 5.3 07/16/2018   HGBA1C 5.4 03/12/2018   Lab Results  Component Value Date   INSULIN 12.7 04/05/2020   INSULIN 10.4 09/24/2019   INSULIN 8.2 01/13/2019   INSULIN 8.2 07/16/2018   INSULIN 6.7 03/12/2018   Lab Results  Component Value Date   TSH 0.667 11/27/2017  Lab Results  Component Value Date   CHOL 218 (H) 04/05/2020   HDL 47 04/05/2020   LDLCALC 160 (H) 04/05/2020   TRIG 65 04/05/2020   Lab Results  Component Value Date   WBC 8.0 11/27/2017   HGB 13.6 11/27/2017   HCT 38.8 11/27/2017   MCV 86 11/27/2017   PLT 343 04/06/2015   No results found for: IRON, TIBC, FERRITIN  Attestation Statements:   Reviewed by clinician on day of visit: allergies, medications, problem list, medical history, surgical history, family history, social history, and previous encounter notes.  IMichaelene Song, am acting as transcriptionist for Abby Potash, PA-C   I have reviewed the above documentation for accuracy and completeness, and I agree with the above. Abby Potash, PA-C

## 2020-07-20 LAB — COMPREHENSIVE METABOLIC PANEL
ALT: 17 IU/L (ref 0–32)
AST: 16 IU/L (ref 0–40)
Albumin/Globulin Ratio: 1.6 (ref 1.2–2.2)
Albumin: 4.6 g/dL (ref 3.8–4.8)
Alkaline Phosphatase: 40 IU/L — ABNORMAL LOW (ref 44–121)
BUN/Creatinine Ratio: 15 (ref 9–23)
BUN: 12 mg/dL (ref 6–24)
Bilirubin Total: 0.6 mg/dL (ref 0.0–1.2)
CO2: 26 mmol/L (ref 20–29)
Calcium: 9.5 mg/dL (ref 8.7–10.2)
Chloride: 101 mmol/L (ref 96–106)
Creatinine, Ser: 0.82 mg/dL (ref 0.57–1.00)
GFR calc Af Amer: 99 mL/min/{1.73_m2} (ref >59)
GFR calc non Af Amer: 85 mL/min/{1.73_m2} (ref >59)
Globulin, Total: 2.8 g/dL (ref 1.5–4.5)
Glucose: 87 mg/dL (ref 65–99)
Potassium: 4.3 mmol/L (ref 3.5–5.2)
Sodium: 139 mmol/L (ref 134–144)
Total Protein: 7.4 g/dL (ref 6.0–8.5)

## 2020-07-20 LAB — LIPID PANEL
Chol/HDL Ratio: 3.7 ratio (ref 0.0–4.4)
Cholesterol, Total: 158 mg/dL (ref 100–199)
HDL: 43 mg/dL (ref >39)
LDL Chol Calc (NIH): 100 mg/dL — ABNORMAL HIGH (ref 0–99)
Triglycerides: 81 mg/dL (ref 0–149)
VLDL Cholesterol Cal: 15 mg/dL (ref 5–40)

## 2020-07-20 LAB — VITAMIN D 25 HYDROXY (VIT D DEFICIENCY, FRACTURES): Vit D, 25-Hydroxy: 62.9 ng/mL (ref 30.0–100.0)

## 2020-07-20 LAB — HEMOGLOBIN A1C
Est. average glucose Bld gHb Est-mCnc: 120 mg/dL
Hgb A1c MFr Bld: 5.8 % — ABNORMAL HIGH (ref 4.8–5.6)

## 2020-07-20 LAB — INSULIN, RANDOM: INSULIN: 10.5 u[IU]/mL (ref 2.6–24.9)

## 2020-07-27 MED FILL — METFORMIN HCL ER 500 MG TB2: 500 | 30 days supply | Qty: 30 | Fill #0

## 2020-08-09 ENCOUNTER — Ambulatory Visit (INDEPENDENT_AMBULATORY_CARE_PROVIDER_SITE_OTHER): Payer: No Typology Code available for payment source | Admitting: Physician Assistant

## 2020-08-29 ENCOUNTER — Other Ambulatory Visit (HOSPITAL_COMMUNITY): Payer: Self-pay | Admitting: Family Medicine

## 2020-08-29 MED FILL — ATORVASTATIN CALCIUM 10 MG: 10 | 90 days supply | Qty: 90 | Fill #0

## 2020-08-31 ENCOUNTER — Ambulatory Visit (INDEPENDENT_AMBULATORY_CARE_PROVIDER_SITE_OTHER): Payer: No Typology Code available for payment source | Admitting: Physician Assistant

## 2020-08-31 ENCOUNTER — Encounter (INDEPENDENT_AMBULATORY_CARE_PROVIDER_SITE_OTHER): Payer: Self-pay | Admitting: Physician Assistant

## 2020-08-31 ENCOUNTER — Other Ambulatory Visit: Payer: Self-pay

## 2020-08-31 ENCOUNTER — Other Ambulatory Visit (INDEPENDENT_AMBULATORY_CARE_PROVIDER_SITE_OTHER): Payer: Self-pay | Admitting: Physician Assistant

## 2020-08-31 VITALS — BP 130/83 | HR 88 | Temp 97.9°F | Ht 65.0 in | Wt 198.0 lb

## 2020-08-31 DIAGNOSIS — Z9189 Other specified personal risk factors, not elsewhere classified: Secondary | ICD-10-CM | POA: Diagnosis not present

## 2020-08-31 DIAGNOSIS — E669 Obesity, unspecified: Secondary | ICD-10-CM

## 2020-08-31 DIAGNOSIS — R7303 Prediabetes: Secondary | ICD-10-CM | POA: Diagnosis not present

## 2020-08-31 DIAGNOSIS — Z6833 Body mass index (BMI) 33.0-33.9, adult: Secondary | ICD-10-CM

## 2020-08-31 DIAGNOSIS — E7849 Other hyperlipidemia: Secondary | ICD-10-CM

## 2020-08-31 MED ORDER — METFORMIN HCL ER 500 MG PO TB24: 500.0000 mg | ORAL_TABLET | Freq: Every day | ORAL | 0 refills | Status: DC

## 2020-08-31 MED FILL — METFORMIN HCL ER 500 MG TB2: 500 | 30 days supply | Qty: 30 | Fill #0

## 2020-08-31 NOTE — Progress Notes (Signed)
Chief Complaint:   OBESITY Robin Knapp is here to discuss her progress with her obesity treatment plan along with follow-up of her obesity related diagnoses. Robin Knapp is on keeping a food journal and adhering to recommended goals of 1200-1400 calories and 90 grams of protein and states she is following her eating plan approximately 60% of the time. Robin Knapp states she is walking for 30 minutes 2 times per week.  Today's visit was #: 73 Starting weight: 199 lbs Starting date: 11/27/2017 Today's weight: 198 lbs Today's date: 08/31/2020 Total lbs lost to date: 1 lb Total lbs lost since last in-office visit: 0  Interim History: Robin Knapp reports that she has been stressed with work,  has not been making the best choices with food and has not been eating as much as she should.  She is not meal planning or prepping.  Subjective:   1. Pre-diabetes [name] has a diagnosis of prediabetes based on her elevated HgA1c and was informed this puts her at greater risk of developing diabetes. She continues to work on diet and exercise to decrease her risk of diabetes. She denies nausea or hypoglycemia.  Last A1c worsened from 5.5 to 5.8.  Lab Results  Component Value Date   HGBA1C 5.8 (H) 07/19/2020   Lab Results  Component Value Date   INSULIN 10.5 07/19/2020   INSULIN 12.7 04/05/2020   INSULIN 10.4 09/24/2019   INSULIN 8.2 01/13/2019   INSULIN 8.2 07/16/2018   2. Other hyperlipidemia Robin Knapp has hyperlipidemia and has been trying to improve her cholesterol levels with intensive lifestyle modification including a low saturated fat diet, exercise and weight loss. She denies any chest pain, claudication or myalgias.  LDL not at goal, but greatly improved.  Not exercising consistently.  Lab Results  Component Value Date   ALT 17 07/19/2020   AST 16 07/19/2020   ALKPHOS 40 (L) 07/19/2020   BILITOT 0.6 07/19/2020   Lab Results  Component Value Date   CHOL 158 07/19/2020   HDL 43 07/19/2020    LDLCALC 100 (H) 07/19/2020   TRIG 81 07/19/2020   CHOLHDL 3.7 07/19/2020   3. At risk for hypoglycemia Robin Knapp is at increased risk for hypoglycemia due to changes in diet, diagnosis of diabetes, and/or insulin use. [name] is not currently taking insulin.   Assessment/Plan:   1. Pre-diabetes Worsening.  Discussed labs with patient today.  Oris will continue to work on weight loss, exercise, and decreasing simple carbohydrates to help decrease the risk of diabetes.  Will refill metformin today.  2. Other hyperlipidemia Discussed labs with patient today.  Cardiovascular risk and specific lipid/LDL goals reviewed.  We discussed several lifestyle modifications today and [name] will continue to work on diet, exercise and weight loss efforts. Orders and follow up as documented in patient record.  Increase exercise, monitor lipids 3-4 times per year.  Counseling Intensive lifestyle modifications are the first line treatment for this issue.  Dietary changes: Increase soluble fiber. Decrease simple carbohydrates.  Exercise changes: Moderate to vigorous-intensity aerobic activity 150 minutes per week if tolerated.  Lipid-lowering medications: see documented in medical record.  3. At risk for hypoglycemia Robin Knapp was given approximately 15 minutes of counseling today regarding prevention of hypoglycemia. She was advised of symptoms of hypoglycemia. Robin Knapp was instructed to avoid skipping meals, eat regular protein rich meals and schedule low calorie snacks as needed.   Repetitive spaced learning was employed today to elicit superior memory formation and behavioral change  4. Class 1 obesity  with serious comorbidity and body mass index (BMI) of 33.0 to 33.9 in adult, unspecified obesity type  Robin Knapp is currently in the action stage of change. As such, her goal is to continue with weight loss efforts. She has agreed to keeping a food journal and adhering to recommended goals of 1200-1400  calories and 90 grams of protein.   Exercise goals: For substantial health benefits, adults should do at least 150 minutes (2 hours and 30 minutes) a week of moderate-intensity, or 75 minutes (1 hour and 15 minutes) a week of vigorous-intensity aerobic physical activity, or an equivalent combination of moderate- and vigorous-intensity aerobic activity. Aerobic activity should be performed in episodes of at least 10 minutes, and preferably, it should be spread throughout the week.  Behavioral modification strategies: increasing lean protein intake and no skipping meals.  Try meal planning and prepping 1 week in advance.  Robin Knapp has agreed to follow-up with our clinic in 3 weeks. She was informed of the importance of frequent follow-up visits to maximize her success with intensive lifestyle modifications for her multiple health conditions.   Objective:   Blood pressure 130/83, pulse 88, temperature 97.9 F (36.6 C), height 5\' 5"  (1.651 m), weight 198 lb (89.8 kg), SpO2 100 %. Body mass index is 32.95 kg/m.  General: Cooperative, alert, well developed, in no acute distress. HEENT: Conjunctivae and lids unremarkable. Cardiovascular: Regular rhythm.  Lungs: Normal work of breathing. Neurologic: No focal deficits.   Lab Results  Component Value Date   CREATININE 0.82 07/19/2020   BUN 12 07/19/2020   NA 139 07/19/2020   K 4.3 07/19/2020   CL 101 07/19/2020   CO2 26 07/19/2020   Lab Results  Component Value Date   ALT 17 07/19/2020   AST 16 07/19/2020   ALKPHOS 40 (L) 07/19/2020   BILITOT 0.6 07/19/2020   Lab Results  Component Value Date   HGBA1C 5.8 (H) 07/19/2020   HGBA1C 5.5 04/05/2020   HGBA1C 5.4 09/24/2019   HGBA1C 5.4 01/13/2019   HGBA1C 5.3 07/16/2018   Lab Results  Component Value Date   INSULIN 10.5 07/19/2020   INSULIN 12.7 04/05/2020   INSULIN 10.4 09/24/2019   INSULIN 8.2 01/13/2019   INSULIN 8.2 07/16/2018   Lab Results  Component Value Date   TSH  0.667 11/27/2017   Lab Results  Component Value Date   CHOL 158 07/19/2020   HDL 43 07/19/2020   LDLCALC 100 (H) 07/19/2020   TRIG 81 07/19/2020   CHOLHDL 3.7 07/19/2020   Lab Results  Component Value Date   WBC 8.0 11/27/2017   HGB 13.6 11/27/2017   HCT 38.8 11/27/2017   MCV 86 11/27/2017   PLT 343 04/06/2015   Attestation Statements:   Reviewed by clinician on day of visit: allergies, medications, problem list, medical history, surgical history, family history, social history, and previous encounter notes.  I, Water quality scientist, CMA, am acting as transcriptionist for Abby Potash, PA-C  I have reviewed the above documentation for accuracy and completeness, and I agree with the above. Abby Potash, PA-C

## 2020-09-19 ENCOUNTER — Other Ambulatory Visit: Payer: Self-pay

## 2020-09-19 ENCOUNTER — Encounter (INDEPENDENT_AMBULATORY_CARE_PROVIDER_SITE_OTHER): Payer: Self-pay | Admitting: Physician Assistant

## 2020-09-19 ENCOUNTER — Ambulatory Visit (INDEPENDENT_AMBULATORY_CARE_PROVIDER_SITE_OTHER): Payer: No Typology Code available for payment source | Admitting: Physician Assistant

## 2020-09-19 VITALS — BP 127/85 | HR 84 | Temp 98.7°F | Ht 65.0 in | Wt 200.0 lb

## 2020-09-19 DIAGNOSIS — E559 Vitamin D deficiency, unspecified: Secondary | ICD-10-CM | POA: Diagnosis not present

## 2020-09-19 DIAGNOSIS — E669 Obesity, unspecified: Secondary | ICD-10-CM | POA: Diagnosis not present

## 2020-09-19 DIAGNOSIS — Z6833 Body mass index (BMI) 33.0-33.9, adult: Secondary | ICD-10-CM

## 2020-09-19 DIAGNOSIS — Z9189 Other specified personal risk factors, not elsewhere classified: Secondary | ICD-10-CM | POA: Diagnosis not present

## 2020-09-19 DIAGNOSIS — R7303 Prediabetes: Secondary | ICD-10-CM

## 2020-09-21 ENCOUNTER — Other Ambulatory Visit (INDEPENDENT_AMBULATORY_CARE_PROVIDER_SITE_OTHER): Payer: Self-pay | Admitting: Physician Assistant

## 2020-09-21 MED ORDER — METFORMIN HCL ER 500 MG PO TB24: 500.0000 mg | ORAL_TABLET | Freq: Every day | ORAL | 0 refills | Status: DC

## 2020-09-21 NOTE — Progress Notes (Signed)
Chief Complaint:   OBESITY Robin Knapp is here to discuss her progress with her obesity treatment plan along with follow-up of her obesity related diagnoses. Robin Knapp is on keeping a food journal and adhering to recommended goals of 1200-1400 calories and 90 grams of protein daily and states she is following her eating plan approximately 50% of the time. Robin Knapp states she is walking for 60 minutes 5 times per week.  Today's visit was #: 39 Starting weight: 199 lbs Starting date: 11/27/2017 Today's weight: 200 lbs Today's date: 09/19/2020 Total lbs lost to date: 0 Total lbs lost since last in-office visit: 0  Interim History: Robin Knapp has been very busy and stressed with work. She has been skipping lunch or dinner on some days. She is exercising more regularly.  Subjective:   1. Vitamin D deficiency Robin Knapp is on OTC Vit D 1,000 IU daily. She denies nausea, vomiting, or muscle weakness.  2. Pre-diabetes Robin Knapp is on metformin, and she denies polyphagia. Her last A1c was 5.8.  3. At risk for diabetes mellitus Robin Knapp is at higher than average risk for developing diabetes due to obesity.   Assessment/Plan:   1. Vitamin D deficiency Low Vitamin D level contributes to fatigue and are associated with obesity, breast, and colon cancer. [name] agreed to continue taking Vitamin D and will follow-up for routine testing of Vitamin D, at least 2-3 times per year to avoid over-replacement.  2. Pre-diabetes Robin Knapp will continue to work on weight loss, exercise, and decreasing simple carbohydrates to help decrease the risk of diabetes. We will refill metformin for 1 month.  - metFORMIN (GLUCOPHAGE-XR) 500 MG 24 hr tablet; Take 1 tablet (500 mg total) by mouth daily with breakfast.  Dispense: 30 tablet; Refill: 0  3. At risk for diabetes mellitus Robin Knapp was given approximately 15 minutes of diabetes education and counseling today. We discussed intensive lifestyle modifications today with an  emphasis on weight loss as well as increasing exercise and decreasing simple carbohydrates in her diet. We also reviewed medication options with an emphasis on risk versus benefit of those discussed.   Repetitive spaced learning was employed today to elicit superior memory formation and behavioral change.  4. Class 1 obesity with serious comorbidity and body mass index (BMI) of 33.0 to 33.9 in adult, unspecified obesity type Robin Knapp is currently in the action stage of change. As such, her goal is to continue with weight loss efforts. She has agreed to keeping a food journal and adhering to recommended goals of 1200-1300 calories and 90 grams of protein daily.   Robin Knapp is to increase her protein at breakfast and lunch.  Exercise goals: As is.  Behavioral modification strategies: increasing lean protein intake and no skipping meals.  Robin Knapp has agreed to follow-up with our clinic in 3 weeks. She was informed of the importance of frequent follow-up visits to maximize her success with intensive lifestyle modifications for her multiple health conditions.   Objective:   Blood pressure 127/85, pulse 84, temperature 98.7 F (37.1 C), height 5\' 5"  (1.651 m), weight 200 lb (90.7 kg), SpO2 97 %. Body mass index is 33.28 kg/m.  General: Cooperative, alert, well developed, in no acute distress. HEENT: Conjunctivae and lids unremarkable. Cardiovascular: Regular rhythm.  Lungs: Normal work of breathing. Neurologic: No focal deficits.   Lab Results  Component Value Date   CREATININE 0.82 07/19/2020   BUN 12 07/19/2020   NA 139 07/19/2020   K 4.3 07/19/2020   CL 101 07/19/2020  CO2 26 07/19/2020   Lab Results  Component Value Date   ALT 17 07/19/2020   AST 16 07/19/2020   ALKPHOS 40 (L) 07/19/2020   BILITOT 0.6 07/19/2020   Lab Results  Component Value Date   HGBA1C 5.8 (H) 07/19/2020   HGBA1C 5.5 04/05/2020   HGBA1C 5.4 09/24/2019   HGBA1C 5.4 01/13/2019   HGBA1C 5.3 07/16/2018    Lab Results  Component Value Date   INSULIN 10.5 07/19/2020   INSULIN 12.7 04/05/2020   INSULIN 10.4 09/24/2019   INSULIN 8.2 01/13/2019   INSULIN 8.2 07/16/2018   Lab Results  Component Value Date   TSH 0.667 11/27/2017   Lab Results  Component Value Date   CHOL 158 07/19/2020   HDL 43 07/19/2020   LDLCALC 100 (H) 07/19/2020   TRIG 81 07/19/2020   CHOLHDL 3.7 07/19/2020   Lab Results  Component Value Date   WBC 8.0 11/27/2017   HGB 13.6 11/27/2017   HCT 38.8 11/27/2017   MCV 86 11/27/2017   PLT 343 04/06/2015   No results found for: IRON, TIBC, FERRITIN  Attestation Statements:   Reviewed by clinician on day of visit: allergies, medications, problem list, medical history, surgical history, family history, social history, and previous encounter notes.   Trude Mcburney, am acting as transcriptionist for [employer], PA-C.  I have reviewed the above documentation for accuracy and completeness, and I agree with the above. Alois Cliche, PA-C

## 2020-09-26 MED FILL — METFORMIN HCL ER 500 MG TB2: 500 | 30 days supply | Qty: 30 | Fill #0

## 2020-10-10 ENCOUNTER — Encounter (INDEPENDENT_AMBULATORY_CARE_PROVIDER_SITE_OTHER): Payer: Self-pay | Admitting: Physician Assistant

## 2020-10-10 ENCOUNTER — Ambulatory Visit (INDEPENDENT_AMBULATORY_CARE_PROVIDER_SITE_OTHER): Payer: No Typology Code available for payment source | Admitting: Physician Assistant

## 2020-10-10 ENCOUNTER — Other Ambulatory Visit: Payer: Self-pay

## 2020-10-10 VITALS — BP 128/77 | HR 80 | Temp 98.1°F | Ht 65.0 in | Wt 202.0 lb

## 2020-10-10 DIAGNOSIS — E7849 Other hyperlipidemia: Secondary | ICD-10-CM | POA: Diagnosis not present

## 2020-10-10 DIAGNOSIS — Z6833 Body mass index (BMI) 33.0-33.9, adult: Secondary | ICD-10-CM | POA: Diagnosis not present

## 2020-10-10 DIAGNOSIS — E669 Obesity, unspecified: Secondary | ICD-10-CM

## 2020-10-10 DIAGNOSIS — R7303 Prediabetes: Secondary | ICD-10-CM

## 2020-10-10 NOTE — Progress Notes (Unsigned)
Chief Complaint:   OBESITY Robin Knapp is here to discuss her progress with her obesity treatment plan along with follow-up of her obesity related diagnoses. Robin Knapp is on keeping a food journal and adhering to recommended goals of 1200-1300 calories and 90 grams of protein daily and states she is following her eating plan approximately 50% of the time. Robin Knapp states she is walking and lifting weights for 60 minutes 2 times per week.  Today's visit was #: 14 Starting weight: 199 lbs Starting date: 11/27/2017 Today's weight: 202 lbs Today's date: 10/10/2020 Total lbs lost to date: 0 Total lbs lost since last in-office visit: 0  Interim History: Robin Knapp notes feeling very discouraged with her plan and the lack of weight loss. She ate some of what she wanted over the last 2 weeks due to frustration. She is not eating all of the food on the plan.  Subjective:   1. Other hyperlipidemia Robin Knapp is not on medications, and her last LDL was not at goal but has much improved. She is exercising regularly.  2. Pre-diabetes Robin Knapp is on metformin, and she denies polyphagia. Last A1c was 5.8.  Assessment/Plan:   1. Other hyperlipidemia Cardiovascular risk and specific lipid/LDL goals reviewed. We discussed several lifestyle modifications today. Robin Knapp will continue her meal plan, and will continue to work on diet, exercise and weight loss efforts. Orders and follow up as documented in patient record.   Counseling Intensive lifestyle modifications are the first line treatment for this issue. . Dietary changes: Increase soluble fiber. Decrease simple carbohydrates. . Exercise changes: Moderate to vigorous-intensity aerobic activity 150 minutes per week if tolerated. . Lipid-lowering medications: see documented in medical record.  2. Pre-diabetes Robin Knapp will continue her medications, and will continue to work on weight loss, exercise, and decreasing simple carbohydrates to help decrease the  risk of diabetes.   3. Class 1 obesity with serious comorbidity and body mass index (BMI) of 33.0 to 33.9 in adult, unspecified obesity type Robin Knapp is currently in the action stage of change. As such, her goal is to continue with weight loss efforts. She has agreed to keeping a food journal and adhering to recommended goals of 1200-1400 calories and 90 grams of protein daily.   GLP-1 was discussed today. Meal plan an pre-journal daily.  Exercise goals: As is.  Behavioral modification strategies: no skipping meals and meal planning and cooking strategies.  Robin Knapp has agreed to follow-up with our clinic in 2 weeks. She was informed of the importance of frequent follow-up visits to maximize her success with intensive lifestyle modifications for her multiple health conditions.   Objective:   Blood pressure 128/77, pulse 80, temperature 98.1 F (36.7 C), temperature source Oral, height 5\' 5"  (1.651 m), weight 202 lb (91.6 kg), SpO2 98 %. Body mass index is 33.61 kg/m.  General: Cooperative, alert, well developed, in no acute distress. HEENT: Conjunctivae and lids unremarkable. Cardiovascular: Regular rhythm.  Lungs: Normal work of breathing. Neurologic: No focal deficits.   Lab Results  Component Value Date   CREATININE 0.82 07/19/2020   BUN 12 07/19/2020   NA 139 07/19/2020   K 4.3 07/19/2020   CL 101 07/19/2020   CO2 26 07/19/2020   Lab Results  Component Value Date   ALT 17 07/19/2020   AST 16 07/19/2020   ALKPHOS 40 (L) 07/19/2020   BILITOT 0.6 07/19/2020   Lab Results  Component Value Date   HGBA1C 5.8 (H) 07/19/2020   HGBA1C 5.5 04/05/2020  HGBA1C 5.4 09/24/2019   HGBA1C 5.4 01/13/2019   HGBA1C 5.3 07/16/2018   Lab Results  Component Value Date   INSULIN 10.5 07/19/2020   INSULIN 12.7 04/05/2020   INSULIN 10.4 09/24/2019   INSULIN 8.2 01/13/2019   INSULIN 8.2 07/16/2018   Lab Results  Component Value Date   TSH 0.667 11/27/2017   Lab Results   Component Value Date   CHOL 158 07/19/2020   HDL 43 07/19/2020   LDLCALC 100 (H) 07/19/2020   TRIG 81 07/19/2020   CHOLHDL 3.7 07/19/2020   Lab Results  Component Value Date   WBC 8.0 11/27/2017   HGB 13.6 11/27/2017   HCT 38.8 11/27/2017   MCV 86 11/27/2017   PLT 343 04/06/2015   No results found for: IRON, TIBC, FERRITIN  Attestation Statements:   Reviewed by clinician on day of visit: allergies, medications, problem list, medical history, surgical history, family history, social history, and previous encounter notes.  Time spent on visit including pre-visit chart review and post-visit care and charting was 30 minutes.    Wilhemena Durie, am acting as transcriptionist for [employer], PA-C.  I have reviewed the above documentation for accuracy and completeness, and I agree with the above. Abby Potash, PA-C

## 2020-10-24 ENCOUNTER — Ambulatory Visit (INDEPENDENT_AMBULATORY_CARE_PROVIDER_SITE_OTHER): Payer: No Typology Code available for payment source | Admitting: Physician Assistant

## 2020-10-25 ENCOUNTER — Other Ambulatory Visit (HOSPITAL_COMMUNITY): Payer: Self-pay | Admitting: Gastroenterology

## 2020-10-25 MED FILL — CLENPIQ 10-3.5-12 MG-GM -GM: 10-3.5-12 M | 1 days supply | Qty: 320 | Fill #0

## 2020-10-31 ENCOUNTER — Ambulatory Visit (INDEPENDENT_AMBULATORY_CARE_PROVIDER_SITE_OTHER): Payer: No Typology Code available for payment source | Admitting: Physician Assistant

## 2020-10-31 ENCOUNTER — Other Ambulatory Visit (INDEPENDENT_AMBULATORY_CARE_PROVIDER_SITE_OTHER): Payer: Self-pay | Admitting: Physician Assistant

## 2020-10-31 ENCOUNTER — Encounter (INDEPENDENT_AMBULATORY_CARE_PROVIDER_SITE_OTHER): Payer: Self-pay | Admitting: Physician Assistant

## 2020-10-31 ENCOUNTER — Other Ambulatory Visit: Payer: Self-pay

## 2020-10-31 VITALS — BP 126/84 | HR 81 | Temp 97.7°F | Ht 65.0 in | Wt 204.0 lb

## 2020-10-31 DIAGNOSIS — R7303 Prediabetes: Secondary | ICD-10-CM | POA: Diagnosis not present

## 2020-10-31 DIAGNOSIS — Z9189 Other specified personal risk factors, not elsewhere classified: Secondary | ICD-10-CM

## 2020-10-31 DIAGNOSIS — E669 Obesity, unspecified: Secondary | ICD-10-CM

## 2020-10-31 DIAGNOSIS — Z6833 Body mass index (BMI) 33.0-33.9, adult: Secondary | ICD-10-CM | POA: Diagnosis not present

## 2020-10-31 DIAGNOSIS — E559 Vitamin D deficiency, unspecified: Secondary | ICD-10-CM | POA: Diagnosis not present

## 2020-10-31 MED ORDER — METFORMIN HCL ER 500 MG PO TB24: 500.0000 mg | ORAL_TABLET | Freq: Every day | ORAL | 0 refills | Status: DC

## 2020-10-31 MED ORDER — OZEMPIC (0.25 OR 0.5 MG/DOSE) 2 MG/1.5ML ~~LOC~~ SOPN: 0.2500 mg | PEN_INJECTOR | SUBCUTANEOUS | 0 refills | Status: DC

## 2020-10-31 MED FILL — OZEMPIC 0.25 OR 0.5 MG/DOSE: 2 | 28 days supply | Qty: 2 | Fill #0

## 2020-10-31 MED FILL — METFORMIN HCL ER 500 MG TB2: 500 | 30 days supply | Qty: 30 | Fill #0

## 2020-11-01 NOTE — Progress Notes (Signed)
Chief Complaint:   OBESITY Robin Knapp is here to discuss her progress with her obesity treatment plan along with follow-up of her obesity related diagnoses. Robin Knapp is on keeping a food journal and adhering to recommended goals of 1200-1400 calories and 90 grams of protein daily and states she is following her eating plan approximately 75-80% of the time. [place] states she is walking, doing weights at the gym for 60 minutes 2 times per week.  Today's visit was #: 58 Starting weight: 199 lbs Starting date: 11/27/2017 Today's weight: 204 lbs Today's date: 10/31/2020 Total lbs lost to date: 0 Total lbs lost since last in-office visit: 0  Interim History: Robin Knapp reports that she has not been meal planning due to her work schedule. She will skip meals some days, but some days she will overeat if she waits too long to eat.  Subjective:   1. Pre-diabetes Robin Knapp's last A1c was 5.8, and she denies nausea, vomiting, or diarrhea on metformin.  2. Vitamin D deficiency Robin Knapp is on 5,000 units daily. Last Vit D level was at goal at 62.9.  3. At risk for hypoglycemia Robin Knapp is at increased risk for hypoglycemia due to changes in diet, diagnosis of diabetes, and/or insulin use.    Assessment/Plan:   1. Pre-diabetes Robin Knapp will continue to work on weight loss, exercise, and decreasing simple carbohydrates to help decrease the risk of diabetes. Robin Knapp agreed to start Ozempic 0.25 mg with no refills, and we will refill metformin for 1 month.  - metFORMIN (GLUCOPHAGE-XR) 500 MG 24 hr tablet; Take 1 tablet (500 mg total) by mouth daily with breakfast.  Dispense: 30 tablet; Refill: 0  2. Vitamin D deficiency Low Vitamin D level contributes to fatigue and are associated with obesity, breast, and colon cancer. Ernestina agreed to continue taking OTC Vitamin D 5,000 IU daily and will follow-up for routine testing of Vitamin D, at least 2-3 times per year to avoid over-replacement.  3. At risk for  hypoglycemia Robin Knapp was given approximately 15 minutes of counseling today regarding prevention of hypoglycemia. She was advised of symptoms of hypoglycemia. Robin Knapp was instructed to avoid skipping meals, eat regular protein rich meals and schedule low calorie snacks as needed.   Repetitive spaced learning was employed today to elicit superior memory formation and behavioral change  4. Class 1 obesity with serious comorbidity and body mass index (BMI) of 33.0 to 33.9 in adult, unspecified obesity type Robin Knapp is currently in the action stage of change. As such, her goal is to continue with weight loss efforts. She has agreed to keeping a food journal and adhering to recommended goals of 1200-1400 calories and 90 grams of protein daily.   Exercise goals: As is.  Behavioral modification strategies: no skipping meals and meal planning and cooking strategies.  Robin Knapp has agreed to follow-up with our clinic in 3 weeks. She was informed of the importance of frequent follow-up visits to maximize her success with intensive lifestyle modifications for her multiple health conditions.   Objective:   Blood pressure 126/84, pulse 81, temperature 97.7 F (36.5 C), height 5\' 5"  (1.651 m), weight 204 lb (92.5 kg), SpO2 97 %. Body mass index is 33.95 kg/m.  General: Cooperative, alert, well developed, in no acute distress. HEENT: Conjunctivae and lids unremarkable. Cardiovascular: Regular rhythm.  Lungs: Normal work of breathing. Neurologic: No focal deficits.   Lab Results  Component Value Date   CREATININE 0.82 07/19/2020   BUN 12 07/19/2020   NA 139 07/19/2020  K 4.3 07/19/2020   CL 101 07/19/2020   CO2 26 07/19/2020   Lab Results  Component Value Date   ALT 17 07/19/2020   AST 16 07/19/2020   ALKPHOS 40 (L) 07/19/2020   BILITOT 0.6 07/19/2020   Lab Results  Component Value Date   HGBA1C 5.8 (H) 07/19/2020   HGBA1C 5.5 04/05/2020   HGBA1C 5.4 09/24/2019   HGBA1C 5.4 01/13/2019    HGBA1C 5.3 07/16/2018   Lab Results  Component Value Date   INSULIN 10.5 07/19/2020   INSULIN 12.7 04/05/2020   INSULIN 10.4 09/24/2019   INSULIN 8.2 01/13/2019   INSULIN 8.2 07/16/2018   Lab Results  Component Value Date   TSH 0.667 11/27/2017   Lab Results  Component Value Date   CHOL 158 07/19/2020   HDL 43 07/19/2020   LDLCALC 100 (H) 07/19/2020   TRIG 81 07/19/2020   CHOLHDL 3.7 07/19/2020   Lab Results  Component Value Date   WBC 8.0 11/27/2017   HGB 13.6 11/27/2017   HCT 38.8 11/27/2017   MCV 86 11/27/2017   PLT 343 04/06/2015   No results found for: IRON, TIBC, FERRITIN  Attestation Statements:   Reviewed by clinician on day of visit: allergies, medications, problem list, medical history, surgical history, family history, social history, and previous encounter notes.   Robin Knapp, am acting as transcriptionist for [employer], PA-C.  I have reviewed the above documentation for accuracy and completeness, and I agree with the above. Robin Potash, PA-C

## 2020-11-15 ENCOUNTER — Encounter (INDEPENDENT_AMBULATORY_CARE_PROVIDER_SITE_OTHER): Payer: Self-pay

## 2020-11-16 ENCOUNTER — Other Ambulatory Visit (HOSPITAL_COMMUNITY): Payer: Self-pay | Admitting: Dermatology

## 2020-11-16 ENCOUNTER — Ambulatory Visit (INDEPENDENT_AMBULATORY_CARE_PROVIDER_SITE_OTHER): Payer: No Typology Code available for payment source | Admitting: Physician Assistant

## 2020-11-17 ENCOUNTER — Other Ambulatory Visit: Payer: Self-pay

## 2020-11-17 ENCOUNTER — Other Ambulatory Visit (INDEPENDENT_AMBULATORY_CARE_PROVIDER_SITE_OTHER): Payer: Self-pay | Admitting: Physician Assistant

## 2020-11-17 ENCOUNTER — Encounter (INDEPENDENT_AMBULATORY_CARE_PROVIDER_SITE_OTHER): Payer: Self-pay | Admitting: Physician Assistant

## 2020-11-17 ENCOUNTER — Ambulatory Visit (INDEPENDENT_AMBULATORY_CARE_PROVIDER_SITE_OTHER): Payer: No Typology Code available for payment source | Admitting: Physician Assistant

## 2020-11-17 VITALS — BP 136/92 | HR 89 | Temp 98.1°F | Ht 65.0 in | Wt 199.0 lb

## 2020-11-17 DIAGNOSIS — E7849 Other hyperlipidemia: Secondary | ICD-10-CM

## 2020-11-17 DIAGNOSIS — E669 Obesity, unspecified: Secondary | ICD-10-CM

## 2020-11-17 DIAGNOSIS — Z6833 Body mass index (BMI) 33.0-33.9, adult: Secondary | ICD-10-CM | POA: Diagnosis not present

## 2020-11-17 DIAGNOSIS — R7303 Prediabetes: Secondary | ICD-10-CM

## 2020-11-17 DIAGNOSIS — Z9189 Other specified personal risk factors, not elsewhere classified: Secondary | ICD-10-CM

## 2020-11-17 MED ORDER — OZEMPIC (0.25 OR 0.5 MG/DOSE) 2 MG/1.5ML ~~LOC~~ SOPN
0.2500 mg | PEN_INJECTOR | SUBCUTANEOUS | 0 refills | Status: DC
Start: 2020-11-17 — End: 2020-12-15

## 2020-11-17 MED FILL — MINOCYCLINE 100 MG CAPSULE: 100 | 30 days supply | Qty: 60 | Fill #0

## 2020-11-21 NOTE — Progress Notes (Signed)
Chief Complaint:   OBESITY Robin Knapp is here to discuss her progress with her obesity treatment plan along with follow-up of her obesity related diagnoses. [name] is on keeping a food journal and adhering to recommended goals of 1200-1400 calories and 90 grams of protein daily and states she is following her eating plan approximately 80-85% of the time. [name] states she is walking and doing weights for 60 minutes 2 times per week since her last visit.  Today's visit was #: 37 Starting weight: 199 lbs Starting date: 11/27/2017 Today's weight: 199 lbs Today's date: 11/17/2020 Total lbs lost to date: 0 Total lbs lost since last in-office visit: 5  Interim History: [name] reports that she recently had a colonoscopy. She is averaging 1200 calories daily and has not been looking at how much protein she is getting.  Subjective:   1. Pre-diabetes Robin Knapp is on Ozempic and metformin, and she denies nausea, vomiting, or diarrhea. She denies polyphagia.  2. Other hyperlipidemia Robin Knapp is on Lipitor, and she denies myalgias or chest pain. She is managed by her primary care physician.  3. At risk for heart disease Robin Knapp is at a higher than average risk for cardiovascular disease due to obesity.   Assessment/Plan:   1. Pre-diabetes [name] will continue to work on weight loss, exercise, and decreasing simple carbohydrates to help decrease the risk of diabetes.   - Semaglutide,0.25 or 0.5MG /DOS, (OZEMPIC, 0.25 OR 0.5 MG/DOSE,) 2 MG/1.5ML SOPN; Inject 0.25 mg into the skin once a week.  Dispense: 1.5 mL; Refill: 0  2. Other hyperlipidemia Cardiovascular risk and specific lipid/LDL goals reviewed.  We discussed several lifestyle modifications today and Robin Knapp will continue to work on diet, exercise and weight loss efforts. Orders and follow up as documented in patient record.   Counseling Intensive lifestyle modifications are the first line treatment for this issue. . Dietary changes:  Increase soluble fiber. Decrease simple carbohydrates. . Exercise changes: Moderate to vigorous-intensity aerobic activity 150 minutes per week if tolerated. . Lipid-lowering medications: see documented in medical record.  3. At risk for heart disease Robin Knapp was given approximately 15 minutes of coronary artery disease prevention counseling today. She is 50 y.o. female and has risk factors for heart disease including obesity. We discussed intensive lifestyle modifications today with an emphasis on specific weight loss instructions and strategies.   Repetitive spaced learning was employed today to elicit superior memory formation and behavioral change.  4. Class 1 obesity with serious comorbidity and body mass index (BMI) of 33.0 to 33.9 in adult, unspecified obesity type Robin Knapp is currently in the action stage of change. As such, her goal is to continue with weight loss efforts. She has agreed to keeping a food journal and adhering to recommended goals of 1400 calories and 95 grams of protein daily.   Exercise goals: As is.  Behavioral modification strategies: meal planning and cooking strategies and keeping healthy foods in the home.  Robin Knapp has agreed to follow-up with our clinic in 3 weeks. She was informed of the importance of frequent follow-up visits to maximize her success with intensive lifestyle modifications for her multiple health conditions.   Objective:   Blood pressure (!) 136/92, pulse 89, temperature 98.1 F (36.7 C), height 5\' 5"  (1.651 m), weight 199 lb (90.3 kg), SpO2 98 %. Body mass index is 33.12 kg/m.  General: Cooperative, alert, well developed, in no acute distress. HEENT: Conjunctivae and lids unremarkable. Cardiovascular: Regular rhythm.  Lungs: Normal work of breathing. Neurologic: No  focal deficits.   Lab Results  Component Value Date   CREATININE 0.82 07/19/2020   BUN 12 07/19/2020   NA 139 07/19/2020   K 4.3 07/19/2020   CL 101 07/19/2020   CO2  26 07/19/2020   Lab Results  Component Value Date   ALT 17 07/19/2020   AST 16 07/19/2020   ALKPHOS 40 (L) 07/19/2020   BILITOT 0.6 07/19/2020   Lab Results  Component Value Date   HGBA1C 5.8 (H) 07/19/2020   HGBA1C 5.5 04/05/2020   HGBA1C 5.4 09/24/2019   HGBA1C 5.4 01/13/2019   HGBA1C 5.3 07/16/2018   Lab Results  Component Value Date   INSULIN 10.5 07/19/2020   INSULIN 12.7 04/05/2020   INSULIN 10.4 09/24/2019   INSULIN 8.2 01/13/2019   INSULIN 8.2 07/16/2018   Lab Results  Component Value Date   TSH 0.667 11/27/2017   Lab Results  Component Value Date   CHOL 158 07/19/2020   HDL 43 07/19/2020   LDLCALC 100 (H) 07/19/2020   TRIG 81 07/19/2020   CHOLHDL 3.7 07/19/2020   Lab Results  Component Value Date   WBC 8.0 11/27/2017   HGB 13.6 11/27/2017   HCT 38.8 11/27/2017   MCV 86 11/27/2017   PLT 343 04/06/2015   No results found for: IRON, TIBC, FERRITIN  Attestation Statements:   Reviewed by clinician on day of visit: allergies, medications, problem list, medical history, surgical history, family history, social history, and previous encounter notes.   Wilhemena Durie, am acting as transcriptionist for [employer], PA-C.  I have reviewed the above documentation for accuracy and completeness, and I agree with the above. Abby Potash, PA-C

## 2020-12-02 ENCOUNTER — Ambulatory Visit: Payer: No Typology Code available for payment source | Admitting: Allergy

## 2020-12-02 ENCOUNTER — Other Ambulatory Visit: Payer: Self-pay | Admitting: Allergy

## 2020-12-02 ENCOUNTER — Encounter: Payer: Self-pay | Admitting: Allergy

## 2020-12-02 ENCOUNTER — Other Ambulatory Visit: Payer: Self-pay

## 2020-12-02 VITALS — BP 128/84 | HR 77 | Ht 66.0 in | Wt 199.0 lb

## 2020-12-02 DIAGNOSIS — J4530 Mild persistent asthma, uncomplicated: Secondary | ICD-10-CM | POA: Diagnosis not present

## 2020-12-02 DIAGNOSIS — L2489 Irritant contact dermatitis due to other agents: Secondary | ICD-10-CM | POA: Diagnosis not present

## 2020-12-02 DIAGNOSIS — L578 Other skin changes due to chronic exposure to nonionizing radiation: Secondary | ICD-10-CM

## 2020-12-02 DIAGNOSIS — J3089 Other allergic rhinitis: Secondary | ICD-10-CM

## 2020-12-02 MED ORDER — ALBUTEROL SULFATE HFA 108 (90 BASE) MCG/ACT IN AERS: 2.0000 | INHALATION_SPRAY | RESPIRATORY_TRACT | 1 refills | Status: DC | PRN

## 2020-12-02 NOTE — Patient Instructions (Addendum)
Allergic rinitis  - continue avoidance measures for grass pollen, weed pollen, tree pollen, mold, dust mite, cat - continue Allegra 180mg  daily as needed - continue Flonase 2 sprays each nostril daily.  If worsened congestion can use twice a day for several days until improved then decrease back to daily use.  - use nasal saline spray and blow nose well prior to use of medicated nasal sprays - allergen immunotherapy has been discussed including protocol, benefits and risk if medication management is not effective.     Mild persistent asthma - have access to albuterol inhaler 2 puffs every 4-6 hours as needed for cough/wheeze/shortness of breath/chest tightness.  May use 15-20 minutes prior to activity.   Monitor frequency of use.    Contact dermatitis/solar dermatitis - for known sun exposure recommend doubling your Allegra dose (2 tabs) and also take Pepcid 20-40mg  to help minimize your histamine response  -Continue recommendations as advised by dermatology  Follow-up 6 months or sooner if needed

## 2020-12-02 NOTE — Progress Notes (Signed)
Follow-up Note  RE: Robin Knapp MRN: [id number] DOB: 04-Feb-1971 Date of Office Visit: 12/02/2020   History of present illness: Robin Knapp is a 50 y.o. female presenting today for follow-up of allergic rhinitis, mild persistent asthma and contact/solar dermatitis.  She was last seen in the office on 07/01/2020 where she had patch testing performed. She states she has been doing well since his last visit without any major health changes, surgeries or hospitalizations. She has been taking Allegra daily.  She will take Flonase 1-2 times a day depending on congestion and this does still help control her nasal congestion. She states the combination of the Allegra and the Flonase at this time are effective in managing her allergy symptoms.  She has not needed to use albuterol but states she has not needed to use this since the last visit.  She denies any daytime or nighttime symptoms at this time.  She has not required any ED or urgent care visits when systemic steroid needs.  She did see Dr. Milford Cage, dermatologist in [facility].  For her solar dermatitis she did recommend that she use physical lockers as well as a product called Helio care which is a supplement.       Review of systems: Review of Systems  Constitutional: Negative.   HENT: Negative.   Eyes: Negative.   Respiratory: Negative.   Cardiovascular: Negative.   Gastrointestinal: Negative.   Musculoskeletal: Negative.   Skin: Negative.   Neurological: Negative.     All other systems negative unless noted above in HPI  Past medical/social/surgical/family history have been reviewed and are unchanged unless specifically indicated below.  No changes  Medication List: Current Outpatient Medications  Medication Sig Dispense Refill  . Semaglutide,0.25 or 0.5MG /DOS, (OZEMPIC, 0.25 OR 0.5 MG/DOSE,) 2 MG/1.5ML SOPN Inject 0.25 mg into the skin once a week. 1.5 mL 0  . acetaminophen (TYLENOL) 650 MG CR tablet  Take 650 mg by mouth every 8 (eight) hours as needed for pain.     [name] albuterol (VENTOLIN HFA) 108 (90 Base) MCG/ACT inhaler Inhale 2 puffs into the lungs every 4 (four) hours as needed for wheezing or shortness of breath. 18 g 1  . atorvastatin (LIPITOR) 10 MG tablet     . cholecalciferol (VITAMIN D3) 25 MCG (1000 UNIT) tablet Take 5,000 Units by mouth daily.    . fexofenadine (ALLEGRA) 180 MG tablet Take 180 mg by mouth daily.     . Fluticasone Furoate 50 MCG/ACT AEPB Inhale 2 sprays into the lungs daily.     [name] ibuprofen (ADVIL,MOTRIN) 600 MG tablet Take 600 mg by mouth every 6 (six) hours as needed.    [name] levonorgestrel (MIRENA) 20 MCG/24HR IUD 1 each by Intrauterine route once.    . Magnesium 250 MG TABS Take 1 tablet by mouth daily.    . naproxen (NAPROSYN) 250 MG tablet Take 500 mg by mouth 2 (two) times daily with a meal.     No current facility-administered medications for this visit.     Known medication allergies: Allergies  Allergen Reactions  . Sulfa Antibiotics Hives    itching  . Sunscreens   . Thimerosal Hives and Itching     Physical examination: Blood pressure 128/84, pulse 77, height 5\' 6"  (1.676 m), weight 199 lb (90.3 kg), SpO2 98 %.  General: Alert, interactive, in no acute distress. HEENT: PERRLA, TMs pearly gray, turbinates non-edematous without discharge, post-pharynx non erythematous. Neck: Supple without lymphadenopathy. Lungs: Clear to  auscultation without wheezing, rhonchi or rales. {no increased work of breathing. CV: Normal S1, S2 without murmurs. Abdomen: Nondistended, nontender. Skin: Warm and dry, without lesions or rashes. Extremities:  No clubbing, cyanosis or edema. Neuro:   Grossly intact.  Diagnositics/Labs: None today  Assessment and plan:    Allergic rinitis  - continue avoidance measures for grass pollen, weed pollen, tree pollen, mold, dust mite, cat - continue Allegra 180mg  daily as needed - continue Flonase 2 sprays each nostril  daily.  If worsened congestion can use twice a day for several days until improved then decrease back to daily use.  - use nasal saline spray and blow nose well prior to use of medicated nasal sprays - allergen immunotherapy has been discussed including protocol, benefits and risk if medication management is not effective.     Mild persistent asthma - have access to albuterol inhaler 2 puffs every 4-6 hours as needed for cough/wheeze/shortness of breath/chest tightness.  May use 15-20 minutes prior to activity.   Monitor frequency of use.    Contact dermatitis/solar dermatitis - for known sun exposure recommend doubling your Allegra dose (2 tabs) and also take Pepcid 20-40mg  to help minimize your   -Continue recommendations as advised by dermatology  Follow-up 6 months or sooner if needed  I appreciate the opportunity to take part in Shadiamond's care. Please do not hesitate to contact me with questions.  Sincerely,   Prudy Feeler, MD Allergy/Immunology [facility]

## 2020-12-15 ENCOUNTER — Other Ambulatory Visit (INDEPENDENT_AMBULATORY_CARE_PROVIDER_SITE_OTHER): Payer: Self-pay | Admitting: Physician Assistant

## 2020-12-15 ENCOUNTER — Other Ambulatory Visit: Payer: Self-pay

## 2020-12-15 ENCOUNTER — Ambulatory Visit (INDEPENDENT_AMBULATORY_CARE_PROVIDER_SITE_OTHER): Payer: No Typology Code available for payment source | Admitting: Physician Assistant

## 2020-12-15 ENCOUNTER — Encounter (INDEPENDENT_AMBULATORY_CARE_PROVIDER_SITE_OTHER): Payer: Self-pay | Admitting: Physician Assistant

## 2020-12-15 VITALS — BP 129/84 | HR 87 | Temp 97.9°F | Ht 66.0 in | Wt 198.0 lb

## 2020-12-15 DIAGNOSIS — E669 Obesity, unspecified: Secondary | ICD-10-CM

## 2020-12-15 DIAGNOSIS — E559 Vitamin D deficiency, unspecified: Secondary | ICD-10-CM | POA: Diagnosis not present

## 2020-12-15 DIAGNOSIS — R7303 Prediabetes: Secondary | ICD-10-CM | POA: Diagnosis not present

## 2020-12-15 DIAGNOSIS — Z9189 Other specified personal risk factors, not elsewhere classified: Secondary | ICD-10-CM

## 2020-12-15 MED ORDER — OZEMPIC (0.25 OR 0.5 MG/DOSE) 2 MG/1.5ML ~~LOC~~ SOPN: 0.5000 mg | PEN_INJECTOR | SUBCUTANEOUS | 0 refills | Status: DC

## 2020-12-19 ENCOUNTER — Other Ambulatory Visit (HOSPITAL_COMMUNITY): Payer: Self-pay

## 2020-12-19 MED FILL — Semaglutide Soln Pen-inj 0.25 or 0.5 MG/DOSE (2 MG/1.5ML): SUBCUTANEOUS | 28 days supply | Qty: 1.5 | Fill #0 | Status: AC

## 2020-12-20 ENCOUNTER — Other Ambulatory Visit (HOSPITAL_COMMUNITY): Payer: Self-pay

## 2020-12-20 NOTE — Progress Notes (Signed)
Chief Complaint:   OBESITY Robin Knapp is here to discuss her progress with her obesity treatment plan along with follow-up of her obesity related diagnoses. Robin Knapp is on keeping a food journal and adhering to recommended goals of 1400 calories and 95 grams of protein daily and states she is following her eating plan approximately 50% of the time. Robin Knapp states she is walking for 30-60 minutes 4 times per week.  Today's visit was #: 38 Starting weight: 199 lbs Starting date: 11/27/2017 Today's weight: 198 lbs Today's date: 12/15/2020 Total lbs lost to date: 1 Total lbs lost since last in-office visit: 1  Interim History: Brit just returned from a conference where she ate the best she could on the road. She is not skipping meals. She journals intermittently.  Subjective:   1. Pre-diabetes Robin Knapp reports some polyphagia. She is tolerating Ozempic 0.25 mg.  2. Vitamin D deficiency Robin Knapp is on 5,000 units of Vit D OTC, and she is tolerating it well.  3. At risk for diabetes mellitus Robin Knapp is at higher than average risk for developing diabetes due to obesity.   Assessment/Plan:   1. Pre-diabetes Robin Knapp agreed to increase Ozempic to 0.5 mg #4 with no refills. She will continue to work on weight loss, exercise, and decreasing simple carbohydrates to help decrease the risk of diabetes.   2. Vitamin D deficiency Low Vitamin D level contributes to fatigue and are associated with obesity, breast, and colon cancer. Robin Knapp agreed to continue taking OTC Vitamin D 5,000 IU daily and will follow-up for routine testing of Vitamin D, at least 2-3 times per year to avoid over-replacement.  3. At risk for diabetes mellitus Robin Knapp was given approximately 15 minutes of diabetes education and counseling today. We discussed intensive lifestyle modifications today with an emphasis on weight loss as well as increasing exercise and decreasing simple carbohydrates in her diet. We also reviewed  medication options with an emphasis on risk versus benefit of those discussed.   Repetitive spaced learning was employed today to elicit superior memory formation and behavioral change.  4. Class 1 obesity with serious comorbidity and body mass index (BMI) of 32.0 to 32.9 in adult, unspecified obesity type Robin Knapp is currently in the action stage of change. As such, her goal is to continue with weight loss efforts. She has agreed to keeping a food journal and adhering to recommended goals of 1400 calories and 95 grams of protein daily.   Exercise goals: As is.  Behavioral modification strategies: meal planning and cooking strategies and keeping healthy foods in the home.  Robin Knapp has agreed to follow-up with our clinic in 3 weeks. She was informed of the importance of frequent follow-up visits to maximize her success with intensive lifestyle modifications for her multiple health conditions.   Objective:   Blood pressure 129/84, pulse 87, temperature 97.9 F (36.6 C), height 5\' 6"  (1.676 m), weight 198 lb (89.8 kg), SpO2 97 %. Body mass index is 31.96 kg/m.  General: Cooperative, alert, well developed, in no acute distress. HEENT: Conjunctivae and lids unremarkable. Cardiovascular: Regular rhythm.  Lungs: Normal work of breathing. Neurologic: No focal deficits.   Lab Results  Component Value Date   CREATININE 0.82 07/19/2020   BUN 12 07/19/2020   NA 139 07/19/2020   K 4.3 07/19/2020   CL 101 07/19/2020   CO2 26 07/19/2020   Lab Results  Component Value Date   ALT 17 07/19/2020   AST 16 07/19/2020   ALKPHOS 40 (L)  07/19/2020   BILITOT 0.6 07/19/2020   Lab Results  Component Value Date   HGBA1C 5.8 (H) 07/19/2020   HGBA1C 5.5 04/05/2020   HGBA1C 5.4 09/24/2019   HGBA1C 5.4 01/13/2019   HGBA1C 5.3 07/16/2018   Lab Results  Component Value Date   INSULIN 10.5 07/19/2020   INSULIN 12.7 04/05/2020   INSULIN 10.4 09/24/2019   INSULIN 8.2 01/13/2019   INSULIN 8.2  07/16/2018   Lab Results  Component Value Date   TSH 0.667 11/27/2017   Lab Results  Component Value Date   CHOL 158 07/19/2020   HDL 43 07/19/2020   LDLCALC 100 (H) 07/19/2020   TRIG 81 07/19/2020   CHOLHDL 3.7 07/19/2020   Lab Results  Component Value Date   WBC 8.0 11/27/2017   HGB 13.6 11/27/2017   HCT 38.8 11/27/2017   MCV 86 11/27/2017   PLT 343 04/06/2015   No results found for: IRON, TIBC, FERRITIN   Attestation Statements:   Reviewed by clinician on day of visit: allergies, medications, problem list, medical history, surgical history, family history, social history, and previous encounter notes.   Wilhemena Durie, am acting as transcriptionist for [employer], PA-C.  I have reviewed the above documentation for accuracy and completeness, and I agree with the above. Abby Potash, PA-C

## 2020-12-23 ENCOUNTER — Other Ambulatory Visit (HOSPITAL_COMMUNITY): Payer: Self-pay | Admitting: Emergency Medicine

## 2020-12-26 ENCOUNTER — Other Ambulatory Visit (HOSPITAL_COMMUNITY): Payer: Self-pay

## 2020-12-26 MED ORDER — ATORVASTATIN CALCIUM 10 MG PO TABS
10.0000 mg | ORAL_TABLET | Freq: Every day | ORAL | 2 refills | Status: DC
  Filled 2020-12-26: qty 90, 90d supply, fill #0

## 2020-12-27 ENCOUNTER — Inpatient Hospital Stay (HOSPITAL_BASED_OUTPATIENT_CLINIC_OR_DEPARTMENT_OTHER): Admission: RE | Admit: 2020-12-27 | Payer: Self-pay | Source: Ambulatory Visit

## 2020-12-27 ENCOUNTER — Other Ambulatory Visit (HOSPITAL_COMMUNITY): Payer: Self-pay

## 2020-12-29 ENCOUNTER — Other Ambulatory Visit (HOSPITAL_COMMUNITY): Payer: Self-pay | Admitting: *Deleted

## 2020-12-30 ENCOUNTER — Ambulatory Visit (HOSPITAL_BASED_OUTPATIENT_CLINIC_OR_DEPARTMENT_OTHER)
Admission: RE | Admit: 2020-12-30 | Discharge: 2020-12-30 | Disposition: A | Discharge status: Home / Self Care | Payer: Self-pay | Source: Ambulatory Visit | Attending: Cardiology | Admitting: Cardiology

## 2020-12-30 ENCOUNTER — Other Ambulatory Visit: Payer: Self-pay

## 2021-01-05 ENCOUNTER — Other Ambulatory Visit: Payer: Self-pay

## 2021-01-05 ENCOUNTER — Ambulatory Visit (INDEPENDENT_AMBULATORY_CARE_PROVIDER_SITE_OTHER): Payer: No Typology Code available for payment source | Admitting: Physician Assistant

## 2021-01-05 ENCOUNTER — Encounter (INDEPENDENT_AMBULATORY_CARE_PROVIDER_SITE_OTHER): Payer: Self-pay | Admitting: Physician Assistant

## 2021-01-05 VITALS — BP 130/89 | HR 85 | Temp 98.0°F | Ht 66.0 in | Wt 197.0 lb

## 2021-01-05 DIAGNOSIS — R7303 Prediabetes: Secondary | ICD-10-CM | POA: Diagnosis not present

## 2021-01-05 DIAGNOSIS — E7849 Other hyperlipidemia: Secondary | ICD-10-CM

## 2021-01-05 DIAGNOSIS — Z9189 Other specified personal risk factors, not elsewhere classified: Secondary | ICD-10-CM | POA: Diagnosis not present

## 2021-01-05 DIAGNOSIS — Z6835 Body mass index (BMI) 35.0-35.9, adult: Secondary | ICD-10-CM

## 2021-01-05 MED ORDER — SEMAGLUTIDE(0.25 OR 0.5MG/DOS) 2 MG/1.5ML ~~LOC~~ SOPN
0.5000 mg | PEN_INJECTOR | SUBCUTANEOUS | 0 refills | Status: DC
  Filled 2021-01-05: qty 1.5, 28d supply, fill #0

## 2021-01-06 ENCOUNTER — Other Ambulatory Visit (HOSPITAL_COMMUNITY): Payer: Self-pay

## 2021-01-09 NOTE — Progress Notes (Signed)
Chief Complaint:   OBESITY Robin Knapp is here to discuss her progress with her obesity treatment plan along with follow-up of her obesity related diagnoses. Robin Knapp is on keeping a food journal and adhering to recommended goals of 1400 calories and 95 g protein and states she is following her eating plan approximately 50% of the time. Robin Knapp states she is not currently exercising.  Today's visit was #: 73 Starting weight: 199 lbs Starting date: 11/27/2017 Today's weight: 197 lbs Today's date: 01/05/2021 Total lbs lost to date: 2 Total lbs lost since last in-office visit: 1  Interim History: Robin Knapp states that she has been skipping meals and not journaling consistently. She is averaging 1200-1300 calories and 75-80 g of protein daily.  Subjective:   1. Other hyperlipidemia Robin Knapp is on Lipitor. She denies myalgias or chest pain.  2. Pre-diabetes Robin Knapp is on Ozempic 0.5 mg, and notes that it is helping with appetite control.  3. At risk for heart disease Robin Knapp is at a higher than average risk for cardiovascular disease due to obesity.    Assessment/Plan:   1. Other hyperlipidemia Cardiovascular risk and specific lipid/LDL goals reviewed.  We discussed several lifestyle modifications today and Robin Knapp will continue to work on diet, exercise and weight loss efforts. Orders and follow up as documented in patient record. Continue Lipitor.  Counseling Intensive lifestyle modifications are the first line treatment for this issue. . Dietary changes: Increase soluble fiber. Decrease simple carbohydrates. . Exercise changes: Moderate to vigorous-intensity aerobic activity 150 minutes per week if tolerated. . Lipid-lowering medications: see documented in medical record.  2. Pre-diabetes Robin Knapp will continue to work on weight loss, exercise, and decreasing simple carbohydrates to help decrease the risk of diabetes.   - Semaglutide,0.25 or 0.5MG /DOS, 2 MG/1.5ML SOPN; Inject 0.5  mg into the skin once a week.  Dispense: 1.5 mL; Refill: 0  3. At risk for heart disease  Robin Knapp was given approximately 15 minutes of coronary artery disease prevention counseling today. She is 50 y.o. female and has risk factors for heart disease including obesity. We discussed intensive lifestyle modifications today with an emphasis on specific weight loss instructions and strategies.   Repetitive spaced learning was employed today to elicit superior memory formation and behavioral change.  4. Class 2 severe obesity with serious comorbidity and body mass index (BMI) of 35.0 to 35.9 in adult, unspecified obesity type (HCC) Robin Knapp is currently in the action stage of change. As such, her goal is to continue with weight loss efforts. She has agreed to keeping a food journal and adhering to recommended goals of 1400 calories and 95 protein.   Exercise goals: As is  Behavioral modification strategies: meal planning and cooking strategies and keeping healthy foods in the home.  Robin Knapp has agreed to follow-up with our clinic in 3 weeks. She was informed of the importance of frequent follow-up visits to maximize her success with intensive lifestyle modifications for her multiple health conditions.   Objective:   Blood pressure 130/89, pulse 85, temperature 98 F (36.7 C), height 5\' 6"  (1.676 m), weight 197 lb (89.4 kg), SpO2 97 %. Body mass index is 31.8 kg/m.  General: Cooperative, alert, well developed, in no acute distress. HEENT: Conjunctivae and lids unremarkable. Cardiovascular: Regular rhythm.  Lungs: Normal work of breathing. Neurologic: No focal deficits.   Lab Results  Component Value Date   CREATININE 0.82 07/19/2020   BUN 12 07/19/2020   NA 139 07/19/2020   K 4.3 07/19/2020  CL 101 07/19/2020   CO2 26 07/19/2020   Lab Results  Component Value Date   ALT 17 07/19/2020   AST 16 07/19/2020   ALKPHOS 40 (L) 07/19/2020   BILITOT 0.6 07/19/2020   Lab Results   Component Value Date   HGBA1C 5.8 (H) 07/19/2020   HGBA1C 5.5 04/05/2020   HGBA1C 5.4 09/24/2019   HGBA1C 5.4 01/13/2019   HGBA1C 5.3 07/16/2018   Lab Results  Component Value Date   INSULIN 10.5 07/19/2020   INSULIN 12.7 04/05/2020   INSULIN 10.4 09/24/2019   INSULIN 8.2 01/13/2019   INSULIN 8.2 07/16/2018   Lab Results  Component Value Date   TSH 0.667 11/27/2017   Lab Results  Component Value Date   CHOL 158 07/19/2020   HDL 43 07/19/2020   LDLCALC 100 (H) 07/19/2020   TRIG 81 07/19/2020   CHOLHDL 3.7 07/19/2020   Lab Results  Component Value Date   WBC 8.0 11/27/2017   HGB 13.6 11/27/2017   HCT 38.8 11/27/2017   MCV 86 11/27/2017   PLT 343 04/06/2015    Attestation Statements:   Reviewed by clinician on day of visit: allergies, medications, problem list, medical history, surgical history, family history, social history, and previous encounter notes.  Coral Ceo, am acting as Location manager for [employer], PA-C.  I have reviewed the above documentation for accuracy and completeness, and I agree with the above. Abby Potash, PA-C

## 2021-01-10 ENCOUNTER — Other Ambulatory Visit (HOSPITAL_COMMUNITY): Payer: Self-pay

## 2021-01-23 ENCOUNTER — Other Ambulatory Visit (HOSPITAL_BASED_OUTPATIENT_CLINIC_OR_DEPARTMENT_OTHER): Payer: Self-pay

## 2021-01-23 DIAGNOSIS — R0683 Snoring: Secondary | ICD-10-CM

## 2021-01-23 DIAGNOSIS — G4710 Hypersomnia, unspecified: Secondary | ICD-10-CM

## 2021-01-23 DIAGNOSIS — R5383 Other fatigue: Secondary | ICD-10-CM

## 2021-01-24 ENCOUNTER — Encounter (INDEPENDENT_AMBULATORY_CARE_PROVIDER_SITE_OTHER): Payer: Self-pay | Admitting: Physician Assistant

## 2021-01-24 ENCOUNTER — Other Ambulatory Visit: Payer: Self-pay

## 2021-01-24 ENCOUNTER — Other Ambulatory Visit (HOSPITAL_COMMUNITY): Payer: Self-pay

## 2021-01-24 ENCOUNTER — Ambulatory Visit (INDEPENDENT_AMBULATORY_CARE_PROVIDER_SITE_OTHER): Payer: No Typology Code available for payment source | Admitting: Physician Assistant

## 2021-01-24 VITALS — BP 130/82 | HR 75 | Temp 98.1°F | Ht 66.0 in | Wt 197.0 lb

## 2021-01-24 DIAGNOSIS — R7303 Prediabetes: Secondary | ICD-10-CM | POA: Diagnosis not present

## 2021-01-24 DIAGNOSIS — Z6835 Body mass index (BMI) 35.0-35.9, adult: Secondary | ICD-10-CM | POA: Diagnosis not present

## 2021-01-24 DIAGNOSIS — Z9189 Other specified personal risk factors, not elsewhere classified: Secondary | ICD-10-CM

## 2021-01-24 MED ORDER — SEMAGLUTIDE(0.25 OR 0.5MG/DOS) 2 MG/1.5ML ~~LOC~~ SOPN
0.5000 mg | PEN_INJECTOR | SUBCUTANEOUS | 0 refills | Status: DC
  Filled 2021-01-24: qty 1.5, 28d supply, fill #0

## 2021-01-24 NOTE — Progress Notes (Signed)
Chief Complaint:   OBESITY Robin Knapp is here to discuss her progress with her obesity treatment plan along with follow-up of her obesity related diagnoses. [name] is on keeping a food journal and adhering to recommended goals of 1400 calories and 95 grams of protein daily and states she is following her eating plan approximately 50% of the time. [name] states she is doing 0 minutes 0 times per week.  Today's visit was #: 62 Starting weight: 199 lbs Starting date: 11/27/2017 Today's weight: 197 lbs Today's date: 01/24/2021 Total lbs lost to date: 2 Total lbs lost since last in-office visit: 0  Interim History: [name] reports that she did some emotional eating over the past weekend. She is on Ozempic 0.5 mg and she is tolerating it well. There were some days she does not eat enough. She wants to start working out again.  Subjective:   1. Pre-diabetes Shantale is on Ozempic 0.5 mg. Her hunger is controlled with no side effects.  2. At risk for diabetes mellitus [name] is at higher than average risk for developing diabetes due to obesity.   Assessment/Plan:   1. Pre-diabetes [name] will continue to work on weight loss, exercise, and decreasing simple carbohydrates to help decrease the risk of diabetes. We will refill Ozempic for 1 month.  - Semaglutide,0.25 or 0.5MG /DOS, 2 MG/1.5ML SOPN; Inject 0.5 mg into the skin once a week.  Dispense: 1.5 mL; Refill: 0  2. At risk for diabetes mellitus [name] was given approximately 15 minutes of diabetes education and counseling today. We discussed intensive lifestyle modifications today with an emphasis on weight loss as well as increasing exercise and decreasing simple carbohydrates in her diet. We also reviewed medication options with an emphasis on risk versus benefit of those discussed.   Repetitive spaced learning was employed today to elicit superior memory formation and behavioral change.  3. Class 2 severe obesity with serious  comorbidity and body mass index (BMI) of 35.0 to 35.9 in adult, unspecified obesity type (HCC) [name] is currently in the action stage of change. As such, her goal is to continue with weight loss efforts. She has agreed to keeping a food journal and adhering to recommended goals of 1300-1400 calories and 95 grams of protein daily.   We will recheck fasting labs at her next visit.  Exercise goals: No exercise has been prescribed at this time.  Behavioral modification strategies: increasing lean protein intake and no skipping meals.  [name] has agreed to follow-up with our clinic in 4 weeks. She was informed of the importance of frequent follow-up visits to maximize her success with intensive lifestyle modifications for her multiple health conditions.   Objective:   Blood pressure 130/82, pulse 75, temperature 98.1 F (36.7 C), height 5\' 6"  (1.676 m), weight 197 lb (89.4 kg), SpO2 97 %. Body mass index is 31.8 kg/m.  General: Cooperative, alert, well developed, in no acute distress. HEENT: Conjunctivae and lids unremarkable. Cardiovascular: Regular rhythm.  Lungs: Normal work of breathing. Neurologic: No focal deficits.   Lab Results  Component Value Date   CREATININE 0.82 07/19/2020   BUN 12 07/19/2020   NA 139 07/19/2020   K 4.3 07/19/2020   CL 101 07/19/2020   CO2 26 07/19/2020   Lab Results  Component Value Date   ALT 17 07/19/2020   AST 16 07/19/2020   ALKPHOS 40 (L) 07/19/2020   BILITOT 0.6 07/19/2020   Lab Results  Component Value Date   HGBA1C 5.8 (H) 07/19/2020  HGBA1C 5.5 04/05/2020   HGBA1C 5.4 09/24/2019   HGBA1C 5.4 01/13/2019   HGBA1C 5.3 07/16/2018   Lab Results  Component Value Date   INSULIN 10.5 07/19/2020   INSULIN 12.7 04/05/2020   INSULIN 10.4 09/24/2019   INSULIN 8.2 01/13/2019   INSULIN 8.2 07/16/2018   Lab Results  Component Value Date   TSH 0.667 11/27/2017   Lab Results  Component Value Date   CHOL 158 07/19/2020   HDL 43  07/19/2020   LDLCALC 100 (H) 07/19/2020   TRIG 81 07/19/2020   CHOLHDL 3.7 07/19/2020   Lab Results  Component Value Date   WBC 8.0 11/27/2017   HGB 13.6 11/27/2017   HCT 38.8 11/27/2017   MCV 86 11/27/2017   PLT 343 04/06/2015   No results found for: IRON, TIBC, FERRITIN  Attestation Statements:   Reviewed by clinician on day of visit: allergies, medications, problem list, medical history, surgical history, family history, social history, and previous encounter notes.   Wilhemena Durie, am acting as transcriptionist for [employer], PA-C.  I have reviewed the above documentation for accuracy and completeness, and I agree with the above. Abby Potash, PA-C

## 2021-02-09 ENCOUNTER — Other Ambulatory Visit (HOSPITAL_COMMUNITY): Payer: Self-pay

## 2021-02-09 MED FILL — Minocycline HCl Cap 100 MG: ORAL | 60 days supply | Qty: 60 | Fill #0 | Status: AC

## 2021-02-20 ENCOUNTER — Other Ambulatory Visit: Payer: Self-pay

## 2021-02-20 ENCOUNTER — Other Ambulatory Visit (HOSPITAL_COMMUNITY): Payer: Self-pay

## 2021-02-20 ENCOUNTER — Ambulatory Visit (INDEPENDENT_AMBULATORY_CARE_PROVIDER_SITE_OTHER): Payer: No Typology Code available for payment source | Admitting: Physician Assistant

## 2021-02-20 VITALS — BP 120/86 | HR 76 | Temp 98.1°F | Ht 66.0 in | Wt 195.0 lb

## 2021-02-20 DIAGNOSIS — Z6835 Body mass index (BMI) 35.0-35.9, adult: Secondary | ICD-10-CM

## 2021-02-20 DIAGNOSIS — Z9189 Other specified personal risk factors, not elsewhere classified: Secondary | ICD-10-CM | POA: Diagnosis not present

## 2021-02-20 DIAGNOSIS — E559 Vitamin D deficiency, unspecified: Secondary | ICD-10-CM

## 2021-02-20 DIAGNOSIS — R7303 Prediabetes: Secondary | ICD-10-CM | POA: Diagnosis not present

## 2021-02-20 DIAGNOSIS — E7849 Other hyperlipidemia: Secondary | ICD-10-CM | POA: Diagnosis not present

## 2021-02-20 MED ORDER — SEMAGLUTIDE(0.25 OR 0.5MG/DOS) 2 MG/1.5ML ~~LOC~~ SOPN
0.5000 mg | PEN_INJECTOR | SUBCUTANEOUS | 0 refills | Status: DC
  Filled 2021-02-20: qty 1.5, 28d supply, fill #0

## 2021-02-21 LAB — HEMOGLOBIN A1C
Est. average glucose Bld gHb Est-mCnc: 108 mg/dL
Hgb A1c MFr Bld: 5.4 % (ref 4.8–5.6)

## 2021-02-21 LAB — LIPID PANEL
Chol/HDL Ratio: 3.7 ratio (ref 0.0–4.4)
Cholesterol, Total: 157 mg/dL (ref 100–199)
HDL: 43 mg/dL (ref >39)
LDL Chol Calc (NIH): 100 mg/dL — ABNORMAL HIGH (ref 0–99)
Triglycerides: 72 mg/dL (ref 0–149)
VLDL Cholesterol Cal: 14 mg/dL (ref 5–40)

## 2021-02-21 LAB — COMPREHENSIVE METABOLIC PANEL
ALT: 22 IU/L (ref 0–32)
AST: 16 IU/L (ref 0–40)
Albumin/Globulin Ratio: 1.8 (ref 1.2–2.2)
Albumin: 4.7 g/dL (ref 3.8–4.8)
Alkaline Phosphatase: 43 IU/L — ABNORMAL LOW (ref 44–121)
BUN/Creatinine Ratio: 10 (ref 9–23)
BUN: 9 mg/dL (ref 6–24)
Bilirubin Total: 0.5 mg/dL (ref 0.0–1.2)
CO2: 22 mmol/L (ref 20–29)
Calcium: 9.2 mg/dL (ref 8.7–10.2)
Chloride: 102 mmol/L (ref 96–106)
Creatinine, Ser: 0.91 mg/dL (ref 0.57–1.00)
Globulin, Total: 2.6 g/dL (ref 1.5–4.5)
Glucose: 75 mg/dL (ref 65–99)
Potassium: 4.5 mmol/L (ref 3.5–5.2)
Sodium: 139 mmol/L (ref 134–144)
Total Protein: 7.3 g/dL (ref 6.0–8.5)
eGFR: 78 mL/min/{1.73_m2} (ref >59)

## 2021-02-21 LAB — VITAMIN D 25 HYDROXY (VIT D DEFICIENCY, FRACTURES): Vit D, 25-Hydroxy: 64.7 ng/mL (ref 30.0–100.0)

## 2021-02-21 LAB — INSULIN, RANDOM: INSULIN: 7.8 u[IU]/mL (ref 2.6–24.9)

## 2021-02-22 NOTE — Progress Notes (Signed)
Chief Complaint:   OBESITY Robin Knapp is here to discuss her progress with her obesity treatment plan along with follow-up of her obesity related diagnoses. Robin Knapp is on keeping a food journal and adhering to recommended goals of 1300-1400 calories and 95 grams of protein daily and states she is following her eating plan approximately 70% of the time. Robin Knapp states she is walking for 30-60 minutes 1-2 times per week.  Today's visit was #: 70 Starting weight: 199 lbs Starting date: 11/27/2017 Today's weight: 195 lbs Today's date: 02/20/2021 Total lbs lost to date: 4 Total lbs lost since last in-office visit: 2  Interim History: Robin Knapp did well with weight loss. She has been intermittently increasing her water. She notes Ozempic is helping with portion control. She is not journaling consistently and she is unsure if she is getting enough protein.  Subjective:   1. Pre-diabetes Robin Knapp is on Ozempic, and she notes being slightly nauseous intermittently.  2. Other hyperlipidemia Robin Knapp is on Lipitor, and she is tolerating it well.  3. Vitamin D deficiency Robin Knapp is on Vit D OTC.  4. At risk for heart disease Robin Knapp is at a higher than average risk for cardiovascular disease due to obesity.   Assessment/Plan:   1. Pre-diabetes Robin Knapp will continue to work on weight loss, exercise, and decreasing simple carbohydrates to help decrease the risk of diabetes. We will check labs today, and we will refill Ozempic for 1 month.  - Semaglutide,0.25 or 0.5MG /DOS, 2 MG/1.5ML SOPN; Inject 0.5 mg into the skin once a week.  Dispense: 1.5 mL; Refill: 0 - Comprehensive metabolic panel - Hemoglobin A1c - Insulin, random  2. Other hyperlipidemia Cardiovascular risk and specific lipid/LDL goals reviewed. We discussed several lifestyle modifications today. We will check labs today.  Robin Knapp will continue her medications, and meal plan, and will continue to work on diet, exercise and weight  loss efforts. Orders and follow up as documented in patient record.   Counseling Intensive lifestyle modifications are the first line treatment for this issue. Dietary changes: Increase soluble fiber. Decrease simple carbohydrates. Exercise changes: Moderate to vigorous-intensity aerobic activity 150 minutes per week if tolerated. Lipid-lowering medications: see documented in medical record.  - Lipid panel  3. Vitamin D deficiency Low Vitamin D level contributes to fatigue and are associated with obesity, breast, and colon cancer. We will check labs today. Robin Knapp will continue taking OTC Vitamin D and will follow-up for routine testing of Vitamin D, at least 2-3 times per year to avoid over-replacement.  - VITAMIN D 25 Hydroxy (Vit-D Deficiency, Fractures)  4. At risk for heart disease Robin Knapp was given approximately 15 minutes of coronary artery disease prevention counseling today. She is 50 y.o. female and has risk factors for heart disease including obesity. We discussed intensive lifestyle modifications today with an emphasis on specific weight loss instructions and strategies.   Repetitive spaced learning was employed today to elicit superior memory formation and behavioral change.  5. Class 2 severe obesity with serious comorbidity and body mass index (BMI) of 35.0 to 35.9 in adult, unspecified obesity type (HCC) Robin Knapp is currently in the action stage of change. As such, her goal is to continue with weight loss efforts. She has agreed to keeping a food journal and adhering to recommended goals of 1400 calories and 90 grams of protein daily.   Exercise goals: As is.  Behavioral modification strategies: increasing lean protein intake and meal planning and cooking strategies.  Robin Knapp has agreed to follow-up  with our clinic in 3 weeks. She was informed of the importance of frequent follow-up visits to maximize her success with intensive lifestyle modifications for her multiple health  conditions.   Robin Knapp was informed we would discuss her lab results at her next visit unless there is a critical issue that needs to be addressed sooner. Robin Knapp agreed to keep her next visit at the agreed upon time to discuss these results.  Objective:   Blood pressure 120/86, pulse 76, temperature 98.1 F (36.7 C), height 5\' 6"  (1.676 m), weight 195 lb (88.5 kg), SpO2 99 %. Body mass index is 31.47 kg/m.  General: Cooperative, alert, well developed, in no acute distress. HEENT: Conjunctivae and lids unremarkable. Cardiovascular: Regular rhythm.  Lungs: Normal work of breathing. Neurologic: No focal deficits.   Lab Results  Component Value Date   CREATININE 0.91 02/20/2021   BUN 9 02/20/2021   NA 139 02/20/2021   K 4.5 02/20/2021   CL 102 02/20/2021   CO2 22 02/20/2021   Lab Results  Component Value Date   ALT 22 02/20/2021   AST 16 02/20/2021   ALKPHOS 43 (L) 02/20/2021   BILITOT 0.5 02/20/2021   Lab Results  Component Value Date   HGBA1C 5.4 02/20/2021   HGBA1C 5.8 (H) 07/19/2020   HGBA1C 5.5 04/05/2020   HGBA1C 5.4 09/24/2019   HGBA1C 5.4 01/13/2019   Lab Results  Component Value Date   INSULIN 7.8 02/20/2021   INSULIN 10.5 07/19/2020   INSULIN 12.7 04/05/2020   INSULIN 10.4 09/24/2019   INSULIN 8.2 01/13/2019   Lab Results  Component Value Date   TSH 0.667 11/27/2017   Lab Results  Component Value Date   CHOL 157 02/20/2021   HDL 43 02/20/2021   LDLCALC 100 (H) 02/20/2021   TRIG 72 02/20/2021   CHOLHDL 3.7 02/20/2021   Lab Results  Component Value Date   WBC 8.0 11/27/2017   HGB 13.6 11/27/2017   HCT 38.8 11/27/2017   MCV 86 11/27/2017   PLT 343 04/06/2015   No results found for: IRON, TIBC, FERRITIN  Attestation Statements:   Reviewed by clinician on day of visit: allergies, medications, problem list, medical history, surgical history, family history, social history, and previous encounter notes.   Wilhemena Durie, am acting as  transcriptionist for [employer], PA-C.  I have reviewed the above documentation for accuracy and completeness, and I agree with the above. Abby Potash, PA-C

## 2021-02-27 ENCOUNTER — Encounter: Payer: Self-pay | Admitting: Cardiology

## 2021-02-27 ENCOUNTER — Other Ambulatory Visit: Payer: Self-pay

## 2021-02-27 ENCOUNTER — Other Ambulatory Visit (HOSPITAL_COMMUNITY): Payer: Self-pay

## 2021-02-27 ENCOUNTER — Ambulatory Visit: Payer: No Typology Code available for payment source | Admitting: Cardiology

## 2021-02-27 VITALS — BP 139/87 | HR 83 | Temp 98.4°F | Resp 16 | Ht 66.0 in | Wt 198.4 lb

## 2021-02-27 DIAGNOSIS — E7800 Pure hypercholesterolemia, unspecified: Secondary | ICD-10-CM

## 2021-02-27 DIAGNOSIS — R931 Abnormal findings on diagnostic imaging of heart and coronary circulation: Secondary | ICD-10-CM

## 2021-02-27 DIAGNOSIS — I341 Nonrheumatic mitral (valve) prolapse: Secondary | ICD-10-CM

## 2021-02-27 DIAGNOSIS — I10 Essential (primary) hypertension: Secondary | ICD-10-CM

## 2021-02-27 MED ORDER — ATORVASTATIN CALCIUM 20 MG PO TABS
20.0000 mg | ORAL_TABLET | Freq: Every day | ORAL | 3 refills | Status: DC
  Filled 2021-02-27: qty 90, 90d supply, fill #0

## 2021-02-27 NOTE — Progress Notes (Signed)
Primary Physician/Referring:  Vernie Shanks, MD  Patient ID: Graceann Congress, female    DOB: 09/08/1971, 50 y.o.   MRN: [id number]  Chief Complaint  Patient presents with   Elevated calcium   New Patient (Initial Visit)   HPI:    MYSTI HALEY  is a 50 y.o. African-American patient who is a Chief Executive Officer by profession, referred to me for evaluation of abnormal coronary calcium score.  Patient is essentially asymptomatic and has hypertension and hyperlipidemia and is presently on therapy for same.  She is accompanied by her husband today.  Past Medical History:  Diagnosis Date   Allergy    Asthma    Breast lump    Constipation    Dry skin    Fatigue    GERD (gastroesophageal reflux disease)    pregnancy related-zantac   HTN (hypertension)    Hyperlipidemia    Infertility, female    Joint pain    Lactose intolerance    Missed abortion    6weeks 3days   Muscle pain    Obesity    Palpitations    PCOS (polycystic ovarian syndrome)    Seasonal allergies    Skin rash    Stress    Vitamin D deficiency    Past Surgical History:  Procedure Laterality Date   BREAST BIOPSY     CESAREAN SECTION     DILATION AND EVACUATION  05/09/2012   Procedure: DILATATION AND EVACUATION;  Surgeon: Marvene Staff, MD;  Location: [facility];  Service: Gynecology;  Laterality: N/A;   EYE SURGERY     LASIK     [place]     WISDOM TOOTH EXTRACTION  1994   Family History  Problem Relation Age of Onset   Hyperlipidemia Mother    Hyperlipidemia Father    Hypertension Father    Anxiety disorder Father    Hyperlipidemia Maternal Grandmother    Hypertension Maternal Grandmother    Heart disease Maternal Grandmother    Heart disease Maternal Grandfather    Diabetes Maternal Grandfather    Stroke Paternal Grandmother    Mental illness Paternal Grandmother    Heart disease Paternal Grandmother     Social History   Tobacco Use   Smoking status: Never   Smokeless tobacco: Never  Substance  Use Topics   Alcohol use: Yes    Alcohol/week: 1.0 standard drink    Types: 1 Glasses of wine per week    Comment: rarely   Marital Status: Married  ROS  Review of Systems  Cardiovascular:  Negative for chest pain, dyspnea on exertion and leg swelling.  Gastrointestinal:  Negative for melena.  Objective  Blood pressure 139/87, pulse 83, temperature 98.4 F (36.9 C), temperature source Temporal, resp. rate 16, height 5\' 6"  (1.676 m), weight 198 lb 6.4 oz (90 kg), SpO2 98 %. Body mass index is 32.02 kg/m.  Vitals with BMI 02/27/2021 02/20/2021 01/24/2021  Height 5\' 6"  5\' 6"  5\' 6"   Weight 198 lbs 6 oz 195 lbs 197 lbs  BMI 32.04 67.89 38.10  Systolic 175 102 585  Diastolic 87 86 82  Pulse 83 76 75     Physical Exam Neck:     Vascular: No carotid bruit or JVD.  Cardiovascular:     Rate and Rhythm: Normal rate and regular rhythm.     Pulses: Intact distal pulses.     Heart sounds: A midsystolic click. Murmur heard.  Crescendo-decrescendo mid to late systolic murmur is present with a  grade of 2/6 at the apex.    No gallop.  Pulmonary:     Effort: Pulmonary effort is normal.     Breath sounds: Normal breath sounds.  Abdominal:     General: Bowel sounds are normal.     Palpations: Abdomen is soft.  Musculoskeletal:        General: No swelling.     Laboratory examination:   Recent Labs    04/05/20 1436 07/19/20 1145 02/20/21 1119  NA 141 139 139  K 4.4 4.3 4.5  CL 101 101 102  CO2 25 26 22   GLUCOSE 89 87 75  BUN 17 12 9   CREATININE 0.93 0.82 0.91  CALCIUM 9.2 9.5 9.2  GFRNONAA 73 85  --   GFRAA 85 99  --    estimated creatinine clearance is 85.5 mL/min (by C-G formula based on SCr of 0.91 mg/dL).  CMP Latest Ref Rng & Units 02/20/2021 07/19/2020 04/05/2020  Glucose 65 - 99 mg/dL 75 87 89  BUN 6 - 24 mg/dL 9 12 17   Creatinine 0.57 - 1.00 mg/dL 0.91 0.82 0.93  Sodium 134 - 144 mmol/L 139 139 141  Potassium 3.5 - 5.2 mmol/L 4.5 4.3 4.4  Chloride 96 - 106 mmol/L 102 101  101  CO2 20 - 29 mmol/L 22 26 25   Calcium 8.7 - 10.2 mg/dL 9.2 9.5 9.2  Total Protein 6.0 - 8.5 g/dL 7.3 7.4 6.8  Total Bilirubin 0.0 - 1.2 mg/dL 0.5 0.6 0.6  Alkaline Phos 44 - 121 IU/L 43(L) 40(L) 37(L)  AST 0 - 40 IU/L 16 16 13   ALT 0 - 32 IU/L 22 17 15    CBC Latest Ref Rng & Units 11/27/2017 04/06/2015 05/09/2012  WBC 3.4 - 10.8 x10E3/uL 8.0 10.4 9.0  Hemoglobin 11.1 - 15.9 g/dL 13.6 13.9 12.4  Hematocrit 34.0 - 46.6 % 38.8 41.2 36.9  Platelets 150 - 400 K/uL - 343 252    Lipid Panel Recent Labs    04/05/20 1436 07/19/20 1145 02/20/21 1119  CHOL 218* 158 157  TRIG 65 81 72  LDLCALC 160* 100* 100*  HDL 47 43 43  CHOLHDL  --  3.7 3.7   Lipid Panel     Component Value Date/Time   CHOL 157 02/20/2021 1119   TRIG 72 02/20/2021 1119   HDL 43 02/20/2021 1119   CHOLHDL 3.7 02/20/2021 1119   LDLCALC 100 (H) 02/20/2021 1119   LABVLDL 14 02/20/2021 1119     HEMOGLOBIN A1C Lab Results  Component Value Date   HGBA1C 5.4 02/20/2021   TSH No results for input(s): TSH in the last 8760 hours. Medications and allergies   Allergies  Allergen Reactions   Sulfa Antibiotics Hives    itching   Sunscreens    Thimerosal Hives and Itching    Medication prior to this encounter:   Outpatient Medications Prior to Visit  Medication Sig Dispense Refill   acetaminophen (TYLENOL) 650 MG CR tablet Take 650 mg by mouth every 8 (eight) hours as needed for pain.      albuterol (VENTOLIN HFA) 108 (90 Base) MCG/ACT inhaler INHALE 2 PUFFS INTO THE LUNGS EVERY 4 (FOUR) HOURS AS NEEDED FOR WHEEZING OR SHORTNESS OF BREATH. 18 g 1   cholecalciferol (VITAMIN D3) 25 MCG (1000 UNIT) tablet Take 5,000 Units by mouth daily.     fexofenadine (ALLEGRA) 180 MG tablet Take 180 mg by mouth daily.      Fluticasone Furoate 50 MCG/ACT AEPB Inhale 2 sprays into the  lungs daily.      ibuprofen (ADVIL,MOTRIN) 600 MG tablet Take 600 mg by mouth every 6 (six) hours as needed.     levonorgestrel (MIRENA) 20  MCG/24HR IUD 1 each by Intrauterine route once.     Magnesium 250 MG TABS Take 1 tablet by mouth daily.     minocycline (MINOCIN) 100 MG capsule TAKE 1 CAPSULE BY MOUTH TWICE DAILY. 60 capsule 1   naproxen (NAPROSYN) 250 MG tablet Take 500 mg by mouth 2 (two) times daily with a meal.     Semaglutide,0.25 or 0.5MG /DOS, 2 MG/1.5ML SOPN Inject 0.5 mg into the skin once a week. 1.5 mL 0   atorvastatin (LIPITOR) 10 MG tablet Take 1 tablet by mouth daily 90 tablet 2   Turmeric 500 MG CAPS Take 1 tablet by mouth daily.     No facility-administered medications prior to visit.     FINAL MEDICATION AS OF TODAY:   Medications after current encounter Current Outpatient Medications  Medication Instructions   acetaminophen (TYLENOL) 650 mg, Oral, Every 8 hours PRN   albuterol (VENTOLIN HFA) 108 (90 Base) MCG/ACT inhaler INHALE 2 PUFFS INTO THE LUNGS EVERY 4 (FOUR) HOURS AS NEEDED FOR WHEEZING OR SHORTNESS OF BREATH.   atorvastatin (LIPITOR) 20 mg, Oral, Daily   cholecalciferol (VITAMIN D3) 5,000 Units, Oral, Daily   fexofenadine (ALLEGRA) 180 mg, Oral, Daily   Fluticasone Furoate 50 MCG/ACT AEPB 2 sprays, Inhalation, Daily   ibuprofen (ADVIL) 600 mg, Oral, Every 6 hours PRN   levonorgestrel (MIRENA) 20 MCG/24HR IUD 1 each, Intrauterine,  Once   Magnesium 250 MG TABS 1 tablet, Oral, Daily   minocycline (MINOCIN) 100 MG capsule TAKE 1 CAPSULE BY MOUTH TWICE DAILY.   naproxen (NAPROSYN) 500 mg, Oral, 2 times daily with meals   Ozempic (0.25 or 0.5 MG/DOSE) 0.5 mg, Subcutaneous, Weekly   Turmeric 500 MG CAPS 1 tablet, Oral, Daily    Radiology:   No results found.  Cardiac Studies:   Coronary calcium score 12/30/2020: Total coronary calcium score 3.05, this was 89th percentile for age, race and sex matched controls. Normal coronary origin, all calcification noted in the right coronary artery.  Ascending aorta normal. Extracardiac findings: Sclerotic lesion in the sternum. This is nonspecific,  but the possibility of a metastatic lesion should be considered. Correlation with results of forthcoming mammography is recommended, and consideration for further evaluation with bone scan or PET-CT would be recommended should mammography raise concern for underlying malignancy.  EKG:     EKG 03/16/2021: Normal sinus rhythm at rate of 75 bpm, left atrial enlargement, normal axis, poor R wave progression, cannot exclude anteroseptal infarct old.  No evidence of ischemia.      Assessment     ICD-10-CM   1. Elevated coronary artery calcium score  R93.1 EKG 12-Lead    PCV CARDIAC STRESS TEST    2. Hypercholesteremia  E78.00 atorvastatin (LIPITOR) 20 MG tablet    3. Primary hypertension  I10     4. Mitral valve prolapse  I34.1 PCV ECHOCARDIOGRAM COMPLETE       Medications Discontinued During This Encounter  Medication Reason   atorvastatin (LIPITOR) 10 MG tablet Dose change    Meds ordered this encounter  Medications   atorvastatin (LIPITOR) 20 MG tablet    Sig: Take 1 tablet (20 mg total) by mouth daily.    Dispense:  90 tablet    Refill:  3   Orders Placed This Encounter  Procedures   PCV CARDIAC  STRESS TEST    Standing Status:   Future    Standing Expiration Date:   04/29/2021   EKG 12-Lead   PCV ECHOCARDIOGRAM COMPLETE    Standing Status:   Future    Standing Expiration Date:   02/27/2022   Recommendations:   Ayn TAREA SKILLMAN is a 50 y.o. 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  She is essentially asymptomatic.  I reviewed her lipids, in view of her calcium score to be in the 89 percentile, will increase Lipitor from 10 mg to 20 mg daily, she has labs scheduled at wellness clinic, patient is enrolled in the wellness clinic for weight loss.  I have discussed with her and  given her some feedback regarding dietary changes that she can do with regard to weight loss as well.  There is no need for aspirin as there is no known coronary disease or vascular disease under examination is normal.  I will perform a routine treadmill exercise stress test.  I will also obtain an echocardiogram to evaluate for mitral valve prolapse.  I will see her back in 6 months for follow-up unless she has new symptoms or tests are markedly abnormal.    Adrian Prows, MD, [facility] 02/27/2021, 10:43 AM Office: [phone]

## 2021-02-28 ENCOUNTER — Ambulatory Visit (HOSPITAL_BASED_OUTPATIENT_CLINIC_OR_DEPARTMENT_OTHER): Payer: No Typology Code available for payment source | Attending: Internal Medicine | Admitting: Internal Medicine

## 2021-02-28 VITALS — Ht 65.0 in | Wt 197.0 lb

## 2021-02-28 DIAGNOSIS — G4733 Obstructive sleep apnea (adult) (pediatric): Secondary | ICD-10-CM | POA: Diagnosis not present

## 2021-02-28 DIAGNOSIS — R0683 Snoring: Secondary | ICD-10-CM

## 2021-02-28 DIAGNOSIS — R5383 Other fatigue: Secondary | ICD-10-CM

## 2021-02-28 DIAGNOSIS — G4710 Hypersomnia, unspecified: Secondary | ICD-10-CM

## 2021-03-14 ENCOUNTER — Other Ambulatory Visit: Payer: Self-pay

## 2021-03-14 ENCOUNTER — Encounter (INDEPENDENT_AMBULATORY_CARE_PROVIDER_SITE_OTHER): Payer: Self-pay | Admitting: Physician Assistant

## 2021-03-14 ENCOUNTER — Other Ambulatory Visit (HOSPITAL_COMMUNITY): Payer: Self-pay

## 2021-03-14 ENCOUNTER — Telehealth (INDEPENDENT_AMBULATORY_CARE_PROVIDER_SITE_OTHER): Payer: No Typology Code available for payment source | Admitting: Physician Assistant

## 2021-03-14 DIAGNOSIS — R7303 Prediabetes: Secondary | ICD-10-CM

## 2021-03-14 DIAGNOSIS — Z6835 Body mass index (BMI) 35.0-35.9, adult: Secondary | ICD-10-CM

## 2021-03-14 MED ORDER — SEMAGLUTIDE(0.25 OR 0.5MG/DOS) 2 MG/1.5ML ~~LOC~~ SOPN
0.5000 mg | PEN_INJECTOR | SUBCUTANEOUS | 0 refills | Status: DC
Start: 2021-03-14 — End: 2021-03-28
  Filled 2021-03-14 – 2021-03-23 (×2): qty 1.5, 28d supply, fill #0

## 2021-03-15 ENCOUNTER — Encounter (INDEPENDENT_AMBULATORY_CARE_PROVIDER_SITE_OTHER): Payer: Self-pay | Admitting: Physician Assistant

## 2021-03-15 NOTE — Procedures (Signed)
    NAME: Robin Knapp DATE OF BIRTH:  March 19, 1971 MEDICAL RECORD NUMBER [id number]  LOCATION: [facility]  PHYSICIAN: Marius Ditch  DATE OF STUDY: 02/28/2021  SLEEP STUDY TYPE: Out of Center Sleep Test                REFERRING PHYSICIAN: Loma Sousa, MD  EPWORTH SLEEPINESS SCORE:  15 HEIGHT: 5\' 5"  (165.1 cm)  WEIGHT: 197 lb (89.4 kg)    Body mass index is 32.78 kg/m.  NECK SIZE: 14 in.  CLINICAL INFORMATION The patient was referred to the sleep center for evaluation excessive daytime sleepiness, loud snoring, awakening herself snoring, and excessive daytime sleepiness MEDICATIONS Patient self administered medications not known to include sleep medicines. SLEEP STUDY TECHNIQUE A multi-channel overnight portable sleep study was performed. The channels recorded were: nasal and oral airflow, thoracic and abdominal respiratory movement, and oxygen saturation with a pulse oximetry. Snoring and body position were also monitored.  TECHNICIAN COMMENTS Comments added by Technician: N/A Comments added by Scorer: N/A  RECORDING SUMMARY The study was initiated at 10:06:29 PM and terminated at 6:51:05 AM. The total recorded time was 524.6 minutes. Time in bed was 385.4 minutes.  RESPIRATORY PARAMETERS The overall AHI was 8.6 per hour, with a central apnea index of 0 per hour. The patient was supine for 66.2%. The oxygen nadir was 85% during sleep.  CARDIAC DATA Mean heart rate during sleep was 86.9 bpm.  IMPRESSIONS - Mild Obstructive Sleep apnea(OSA)  DIAGNOSIS - Obstructive Sleep Apnea (G47.33)  RECOMMENDATIONS - The patient could be treated with weight loss, oral appliance therapy or CPAP.   Marius Ditch Sleep specialist, [place] Board of Internal Medicine  ELECTRONICALLY SIGNED ON:  03/15/2021, 6:40 AM [place] PH: [phone]   FX: [phone] [place]

## 2021-03-16 NOTE — Progress Notes (Signed)
TeleHealth Visit:  Due to the COVID-19 pandemic, this visit was completed with telemedicine (audio/video) technology to reduce patient and provider exposure as well as to preserve personal protective equipment.   Robin Knapp has verbally consented to this TeleHealth visit. The patient is located at home, the provider is located at the [facility] office. The participants in this visit include the listed provider and patient. The visit was conducted today via MyChart Video.   Chief Complaint: OBESITY Robin Knapp is here to discuss her progress with her obesity treatment plan along with follow-up of her obesity related diagnoses. Robin Knapp is on keeping a food journal and adhering to recommended goals of 1400 calories and 90 grams of protein daily and states she is following her eating plan approximately 50% of the time. Robin Knapp states she is walking and weight lifting for 25-60 minutes 1-2 times per week.  Today's visit was #: 97 Starting weight: 199 lbs Starting date: 11/27/2017  Interim History: Robin Knapp is at home today due to body aches, chills, and fatigue. She reports that she is not journaling consistently.  Subjective:   1. Pre-diabetes Robin Knapp is on Ozempic, and her last A1c improved to 5.4 from 5.8. I discussed labs with the patient today.  Assessment/Plan:   1. Pre-diabetes Robin Knapp will continue to work on weight loss, exercise, and decreasing simple carbohydrates to help decrease the risk of diabetes. We will refill Ozempic for 1 month.  - Semaglutide,0.25 or 0.5MG /DOS, 2 MG/1.5ML SOPN; Inject 0.5 mg into the skin once a week.  Dispense: 1.5 mL; Refill: 0  2. Class 2 severe obesity with serious comorbidity and body mass index (BMI) of 35.0 to 35.9 in adult, unspecified obesity type (HCC) Robin Knapp is currently in the action stage of change. As such, her goal is to continue with weight loss efforts. She has agreed to keeping a food journal and adhering to recommended  goals of 1300-1400 calories and 90 grams of protein daily.   Exercise goals: As is.  Behavioral modification strategies: keeping healthy foods in the home.  Robin Knapp has agreed to follow-up with our clinic in 2 weeks. She was informed of the importance of frequent follow-up visits to maximize her success with intensive lifestyle modifications for her multiple health conditions.  Objective:   VITALS: Per patient if applicable, see vitals. GENERAL: Alert and in no acute distress. CARDIOPULMONARY: No increased WOB. Speaking in clear sentences.  PSYCH: Pleasant and cooperative. Speech normal rate and rhythm. Affect is appropriate. Insight and judgement are appropriate. Attention is focused, linear, and appropriate.  NEURO: Oriented as arrived to appointment on time with no prompting.   Lab Results  Component Value Date   CREATININE 0.91 02/20/2021   BUN 9 02/20/2021   NA 139 02/20/2021   K 4.5 02/20/2021   CL 102 02/20/2021   CO2 22 02/20/2021   Lab Results  Component Value Date   ALT 22 02/20/2021   AST 16 02/20/2021   ALKPHOS 43 (L) 02/20/2021   BILITOT 0.5 02/20/2021   Lab Results  Component Value Date   HGBA1C 5.4 02/20/2021   HGBA1C 5.8 (H) 07/19/2020   HGBA1C 5.5 04/05/2020   HGBA1C 5.4 09/24/2019   HGBA1C 5.4 01/13/2019   Lab Results  Component Value Date   INSULIN 7.8 02/20/2021   INSULIN 10.5 07/19/2020   INSULIN 12.7 04/05/2020   INSULIN 10.4 09/24/2019   INSULIN 8.2 01/13/2019   Lab Results  Component Value Date   TSH 0.667 11/27/2017   Lab  Results  Component Value Date   CHOL 157 02/20/2021   HDL 43 02/20/2021   LDLCALC 100 (H) 02/20/2021   TRIG 72 02/20/2021   CHOLHDL 3.7 02/20/2021   Lab Results  Component Value Date   VD25OH 64.7 02/20/2021   VD25OH 62.9 07/19/2020   VD25OH 58.2 04/05/2020   Lab Results  Component Value Date   WBC 8.0 11/27/2017   HGB 13.6 11/27/2017   HCT 38.8 11/27/2017   MCV 86 11/27/2017   PLT 343 04/06/2015   No  results found for: IRON, TIBC, FERRITIN  Attestation Statements:   Reviewed by clinician on day of visit: allergies, medications, problem list, medical history, surgical history, family history, social history, and previous encounter notes.   Wilhemena Durie, am acting as transcriptionist for [employer], PA-C.  I have reviewed the above documentation for accuracy and completeness, and I agree with the above. Abby Potash, PA-C

## 2021-03-16 NOTE — Telephone Encounter (Signed)
FYI

## 2021-03-22 ENCOUNTER — Other Ambulatory Visit (HOSPITAL_COMMUNITY): Payer: Self-pay

## 2021-03-23 ENCOUNTER — Other Ambulatory Visit (INDEPENDENT_AMBULATORY_CARE_PROVIDER_SITE_OTHER): Payer: Self-pay | Admitting: Physician Assistant

## 2021-03-23 ENCOUNTER — Other Ambulatory Visit (HOSPITAL_COMMUNITY): Payer: Self-pay

## 2021-03-23 DIAGNOSIS — R7303 Prediabetes: Secondary | ICD-10-CM

## 2021-03-23 NOTE — Telephone Encounter (Signed)
Last seen Tracey 

## 2021-03-28 ENCOUNTER — Other Ambulatory Visit: Payer: Self-pay

## 2021-03-28 ENCOUNTER — Ambulatory Visit (INDEPENDENT_AMBULATORY_CARE_PROVIDER_SITE_OTHER): Payer: No Typology Code available for payment source | Admitting: Physician Assistant

## 2021-03-28 ENCOUNTER — Other Ambulatory Visit (HOSPITAL_COMMUNITY): Payer: Self-pay

## 2021-03-28 ENCOUNTER — Encounter (INDEPENDENT_AMBULATORY_CARE_PROVIDER_SITE_OTHER): Payer: Self-pay | Admitting: Physician Assistant

## 2021-03-28 VITALS — BP 119/79 | HR 82 | Temp 98.0°F | Ht 66.0 in | Wt 192.0 lb

## 2021-03-28 DIAGNOSIS — Z9189 Other specified personal risk factors, not elsewhere classified: Secondary | ICD-10-CM | POA: Diagnosis not present

## 2021-03-28 DIAGNOSIS — R7303 Prediabetes: Secondary | ICD-10-CM

## 2021-03-28 DIAGNOSIS — Z6835 Body mass index (BMI) 35.0-35.9, adult: Secondary | ICD-10-CM

## 2021-03-28 MED ORDER — SEMAGLUTIDE(0.25 OR 0.5MG/DOS) 2 MG/1.5ML ~~LOC~~ SOPN
0.5000 mg | PEN_INJECTOR | SUBCUTANEOUS | 0 refills | Status: DC
  Filled 2021-03-28: qty 1.5, 28d supply, fill #0

## 2021-03-29 NOTE — Telephone Encounter (Signed)
FYI

## 2021-03-30 ENCOUNTER — Other Ambulatory Visit: Payer: Self-pay | Admitting: Cardiology

## 2021-03-30 DIAGNOSIS — R931 Abnormal findings on diagnostic imaging of heart and coronary circulation: Secondary | ICD-10-CM

## 2021-04-04 NOTE — Progress Notes (Signed)
Chief Complaint:   OBESITY Robin Knapp is here to discuss her progress with her obesity treatment plan along with follow-up of her obesity related diagnoses. [name] is on keeping a food journal and adhering to recommended goals of 1300-1400 calories and 90 grams of protein daily and states she is following her eating plan approximately 75% of the time. [name] states she is walking 2 miles 2 times per week.   Today's visit was #: 86 Starting weight: 199 lbs Starting date: 11/27/2017 Today's weight: 192 lbs Today's date: 03/28/2021 Total lbs lost to date: 7 Total lbs lost since last in-office visit: 3  Interim History: Imunique continues to recover from [place]. Her appetite is returning slowly. She is trying to be more creative with her meals and will be meal prepping breakfast frittatas.   Subjective:   1. Pre-diabetes Robin Knapp is on Ozempic 0.5 mg and she notes it is decreasing her appetite and helping her portion control.  2. At risk for diabetes mellitus Robin Knapp is at higher than average risk for developing diabetes due to obesity.   Assessment/Plan:   1. Pre-diabetes [name] will continue to work on weight loss, exercise, and decreasing simple carbohydrates to help decrease the risk of diabetes. We will refill Ozempic 0.5 mg for 1 month.  - Semaglutide,0.25 or 0.5MG /DOS, 2 MG/1.5ML SOPN; Inject 0.5 mg into the skin once a week.  Dispense: 1.5 mL; Refill: 0  2. At risk for diabetes mellitus [name] was given approximately 15 minutes of diabetes education and counseling today. We discussed intensive lifestyle modifications today with an emphasis on weight loss as well as increasing exercise and decreasing simple carbohydrates in her diet. We also reviewed medication options with an emphasis on risk versus benefit of those discussed.   Repetitive spaced learning was employed today to elicit superior memory formation and behavioral change.  3. Class 2 severe obesity with serious  comorbidity and body mass index (BMI) of 35.0 to 35.9 in adult, unspecified obesity type ([place]) ;current bmi 48 Robin Knapp is currently in the action stage of change. As such, her goal is to continue with weight loss efforts. She has agreed to keeping a food journal and adhering to recommended goals of 1200 calories and 80 grams of protein daily.   Exercise goals: As is.  Behavioral modification strategies: meal planning and cooking strategies and keeping healthy foods in the home.  [name] has agreed to follow-up with our clinic in 3 weeks. She was informed of the importance of frequent follow-up visits to maximize her success with intensive lifestyle modifications for her multiple health conditions.   Objective:   Blood pressure 119/79, pulse 82, temperature 98 F (36.7 C), height 5\' 6"  (1.676 m), weight 192 lb (87.1 kg), SpO2 100 %. Body mass index is 30.99 kg/m.  General: Cooperative, alert, well developed, in no acute distress. HEENT: Conjunctivae and lids unremarkable. Cardiovascular: Regular rhythm.  Lungs: Normal work of breathing. Neurologic: No focal deficits.   Lab Results  Component Value Date   CREATININE 0.91 02/20/2021   BUN 9 02/20/2021   NA 139 02/20/2021   K 4.5 02/20/2021   CL 102 02/20/2021   CO2 22 02/20/2021   Lab Results  Component Value Date   ALT 22 02/20/2021   AST 16 02/20/2021   ALKPHOS 43 (L) 02/20/2021   BILITOT 0.5 02/20/2021   Lab Results  Component Value Date   HGBA1C 5.4 02/20/2021   HGBA1C 5.8 (H) 07/19/2020   HGBA1C 5.5 04/05/2020  HGBA1C 5.4 09/24/2019   HGBA1C 5.4 01/13/2019   Lab Results  Component Value Date   INSULIN 7.8 02/20/2021   INSULIN 10.5 07/19/2020   INSULIN 12.7 04/05/2020   INSULIN 10.4 09/24/2019   INSULIN 8.2 01/13/2019   Lab Results  Component Value Date   TSH 0.667 11/27/2017   Lab Results  Component Value Date   CHOL 157 02/20/2021   HDL 43 02/20/2021   LDLCALC 100 (H) 02/20/2021   TRIG 72 02/20/2021    CHOLHDL 3.7 02/20/2021   Lab Results  Component Value Date   VD25OH 64.7 02/20/2021   VD25OH 62.9 07/19/2020   VD25OH 58.2 04/05/2020   Lab Results  Component Value Date   WBC 8.0 11/27/2017   HGB 13.6 11/27/2017   HCT 38.8 11/27/2017   MCV 86 11/27/2017   PLT 343 04/06/2015   No results found for: IRON, TIBC, FERRITIN  Attestation Statements:   Reviewed by clinician on day of visit: allergies, medications, problem list, medical history, surgical history, family history, social history, and previous encounter notes.   Wilhemena Durie, am acting as transcriptionist for [employer], PA-C.  I have reviewed the above documentation for accuracy and completeness, and I agree with the above. Abby Potash, PA-C

## 2021-04-07 ENCOUNTER — Ambulatory Visit: Payer: No Typology Code available for payment source

## 2021-04-07 ENCOUNTER — Other Ambulatory Visit: Payer: Self-pay

## 2021-04-07 DIAGNOSIS — I341 Nonrheumatic mitral (valve) prolapse: Secondary | ICD-10-CM

## 2021-04-13 ENCOUNTER — Other Ambulatory Visit (HOSPITAL_COMMUNITY): Payer: Self-pay

## 2021-04-13 ENCOUNTER — Other Ambulatory Visit (INDEPENDENT_AMBULATORY_CARE_PROVIDER_SITE_OTHER): Payer: Self-pay | Admitting: Primary Care

## 2021-04-13 DIAGNOSIS — M62830 Muscle spasm of back: Secondary | ICD-10-CM

## 2021-04-13 MED ORDER — CYCLOBENZAPRINE HCL 10 MG PO TABS
10.0000 mg | ORAL_TABLET | Freq: Three times a day (TID) | ORAL | 1 refills | Status: DC | PRN
  Filled 2021-04-13: qty 60, 20d supply, fill #0

## 2021-04-18 ENCOUNTER — Ambulatory Visit (INDEPENDENT_AMBULATORY_CARE_PROVIDER_SITE_OTHER): Payer: No Typology Code available for payment source | Admitting: Physician Assistant

## 2021-04-18 ENCOUNTER — Encounter (INDEPENDENT_AMBULATORY_CARE_PROVIDER_SITE_OTHER): Payer: Self-pay | Admitting: Family Medicine

## 2021-04-18 ENCOUNTER — Encounter (INDEPENDENT_AMBULATORY_CARE_PROVIDER_SITE_OTHER): Payer: Self-pay

## 2021-04-18 ENCOUNTER — Other Ambulatory Visit (HOSPITAL_COMMUNITY): Payer: Self-pay

## 2021-04-18 ENCOUNTER — Other Ambulatory Visit: Payer: Self-pay

## 2021-04-18 ENCOUNTER — Ambulatory Visit (INDEPENDENT_AMBULATORY_CARE_PROVIDER_SITE_OTHER): Payer: No Typology Code available for payment source | Admitting: Family Medicine

## 2021-04-18 VITALS — BP 121/83 | HR 78 | Temp 98.1°F | Ht 66.0 in | Wt 193.0 lb

## 2021-04-18 DIAGNOSIS — Z9189 Other specified personal risk factors, not elsewhere classified: Secondary | ICD-10-CM | POA: Diagnosis not present

## 2021-04-18 DIAGNOSIS — E7849 Other hyperlipidemia: Secondary | ICD-10-CM

## 2021-04-18 DIAGNOSIS — E669 Obesity, unspecified: Secondary | ICD-10-CM

## 2021-04-18 DIAGNOSIS — Z6833 Body mass index (BMI) 33.0-33.9, adult: Secondary | ICD-10-CM

## 2021-04-18 DIAGNOSIS — R7303 Prediabetes: Secondary | ICD-10-CM | POA: Diagnosis not present

## 2021-04-18 MED ORDER — SEMAGLUTIDE (1 MG/DOSE) 4 MG/3ML ~~LOC~~ SOPN
1.0000 mg | PEN_INJECTOR | SUBCUTANEOUS | 0 refills | Status: DC
  Filled 2021-04-18: qty 3, 28d supply, fill #0

## 2021-04-19 NOTE — Progress Notes (Signed)
Chief Complaint:   OBESITY Robin Knapp is here to discuss her progress with her obesity treatment plan along with follow-up of her obesity related diagnoses. Robin Knapp is on keeping a food journal and adhering to recommended goals of 1200-1400 calories and 80 grams protein and states she is following her eating plan approximately 50% of the time. Robin Knapp states she is walking 30 minutes 2 times per week.  Today's visit was #: 70 Starting weight: 199 lbs Starting date: 11/27/2017 Today's weight: 193 lbs Today's date: 04/18/2021 Total lbs lost to date: 6 Total lbs lost since last in-office visit: 0  Interim History: Robin Knapp felt she was back to feeling as she did prior to starting Ozempic. She is trying to journal and working on getting closer to 1400 calories and getting ~95 grams protein. She is looking for easy options.  Subjective:   1. Pre-diabetes Robin Knapp is on Ozempic 0.5 mg weekly. Her last A1c was 5.4 and insulin level 7.8.  2. Other hyperlipidemia Robin Knapp is on Lipitor and denies myalgias.  3. At risk for side effect of medication Robin Knapp is at risk for side effects of medication due to increasing dose of Ozempic.  Assessment/Plan:   1. Pre-diabetes Robin Knapp will increase Ozempic to 1 mg and continue to work on weight loss, exercise, and decreasing simple carbohydrates to help decrease the risk of diabetes.   Increase and Refill- Semaglutide, 1 MG/DOSE, 4 MG/3ML SOPN; Inject 1 mg into the skin once a week.  Dispense: 3 mL; Refill: 0  2. Other hyperlipidemia Continue Lipitor with no change in dose. Cardiovascular risk and specific lipid/LDL goals reviewed.  We discussed several lifestyle modifications today and Robin Knapp will continue to work on diet, exercise and weight loss efforts. Orders and follow up as documented in patient record.   Counseling Intensive lifestyle modifications are the first line treatment for this issue. Dietary changes: Increase soluble fiber.  Decrease simple carbohydrates. Exercise changes: Moderate to vigorous-intensity aerobic activity 150 minutes per week if tolerated. Lipid-lowering medications: see documented in medical record.  3. At risk for side effect of medication Robin Knapp was given approximately 15 minutes of drug side effect counseling today.  We discussed side effect possibility and risk versus benefits. Robin Knapp agreed to the medication and will contact this office if these side effects are intolerable.  Repetitive spaced learning was employed today to elicit superior memory formation and behavioral change.   4. Obesity with current BMI of 31.2  Robin Knapp is currently in the action stage of change. As such, her goal is to continue with weight loss efforts. She has agreed to keeping a food journal and adhering to recommended goals of 1200-1400 calories and 90+ grams protein.   Exercise goals: All adults should avoid inactivity. Some physical activity is better than none, and adults who participate in any amount of physical activity gain some health benefits.  Behavioral modification strategies: increasing lean protein intake, meal planning and cooking strategies, keeping healthy foods in the home, and planning for success.  Robin Knapp has agreed to follow-up with our clinic in 3 weeks. She was informed of the importance of frequent follow-up visits to maximize her success with intensive lifestyle modifications for her multiple health conditions.   Objective:   Blood pressure 121/83, pulse 78, temperature 98.1 F (36.7 C), height '5\' 6"'$  (1.676 m), weight 193 lb (87.5 kg), last menstrual period 03/20/2021, SpO2 100 %. Body mass index is 31.15 kg/m.  General: Cooperative, alert, well developed, in no acute distress. HEENT:  Conjunctivae and lids unremarkable. Cardiovascular: Regular rhythm.  Lungs: Normal work of breathing. Neurologic: No focal deficits.   Lab Results  Component Value Date   CREATININE 0.91 02/20/2021    BUN 9 02/20/2021   NA 139 02/20/2021   K 4.5 02/20/2021   CL 102 02/20/2021   CO2 22 02/20/2021   Lab Results  Component Value Date   ALT 22 02/20/2021   AST 16 02/20/2021   ALKPHOS 43 (L) 02/20/2021   BILITOT 0.5 02/20/2021   Lab Results  Component Value Date   HGBA1C 5.4 02/20/2021   HGBA1C 5.8 (H) 07/19/2020   HGBA1C 5.5 04/05/2020   HGBA1C 5.4 09/24/2019   HGBA1C 5.4 01/13/2019   Lab Results  Component Value Date   INSULIN 7.8 02/20/2021   INSULIN 10.5 07/19/2020   INSULIN 12.7 04/05/2020   INSULIN 10.4 09/24/2019   INSULIN 8.2 01/13/2019   Lab Results  Component Value Date   TSH 0.667 11/27/2017   Lab Results  Component Value Date   CHOL 157 02/20/2021   HDL 43 02/20/2021   LDLCALC 100 (H) 02/20/2021   TRIG 72 02/20/2021   CHOLHDL 3.7 02/20/2021   Lab Results  Component Value Date   VD25OH 64.7 02/20/2021   VD25OH 62.9 07/19/2020   VD25OH 58.2 04/05/2020   Lab Results  Component Value Date   WBC 8.0 11/27/2017   HGB 13.6 11/27/2017   HCT 38.8 11/27/2017   MCV 86 11/27/2017   PLT 343 04/06/2015    Attestation Statements:   Reviewed by clinician on day of visit: allergies, medications, problem list, medical history, surgical history, family history, social history, and previous encounter notes.  Coral Ceo, CMA, am acting as transcriptionist for Coralie Common, MD.   I have reviewed the above documentation for accuracy and completeness, and I agree with the above. - Coralie Common, MD

## 2021-05-01 ENCOUNTER — Encounter (INDEPENDENT_AMBULATORY_CARE_PROVIDER_SITE_OTHER): Payer: Self-pay

## 2021-05-09 ENCOUNTER — Ambulatory Visit (INDEPENDENT_AMBULATORY_CARE_PROVIDER_SITE_OTHER): Payer: No Typology Code available for payment source | Admitting: Physician Assistant

## 2021-05-09 ENCOUNTER — Encounter (INDEPENDENT_AMBULATORY_CARE_PROVIDER_SITE_OTHER): Payer: Self-pay | Admitting: Family Medicine

## 2021-05-09 ENCOUNTER — Other Ambulatory Visit (INDEPENDENT_AMBULATORY_CARE_PROVIDER_SITE_OTHER): Payer: Self-pay | Admitting: Family Medicine

## 2021-05-09 ENCOUNTER — Other Ambulatory Visit (HOSPITAL_COMMUNITY): Payer: Self-pay

## 2021-05-09 DIAGNOSIS — R7303 Prediabetes: Secondary | ICD-10-CM

## 2021-05-09 MED ORDER — OZEMPIC (1 MG/DOSE) 4 MG/3ML ~~LOC~~ SOPN
1.0000 mg | PEN_INJECTOR | SUBCUTANEOUS | 0 refills | Status: DC
  Filled 2021-05-09: qty 3, 28d supply, fill #0

## 2021-05-09 NOTE — Telephone Encounter (Signed)
Pt states that she will be out and overdue by her next appointment.  LAST APPOINTMENT DATE: 04/18/2021 NEXT APPOINTMENT DATE: 05/29/2021   [facility] [address] [place] [address] Phone: [phone] Fax: [phone]  [id number] [id number] [id number]: [phone]: [phone] [facility] [address] [place]Phone: [phone]: [phone] [address]: [phone]: [phone] [id number] [place] [address]Phone: [phone]: [phone] Patient is requesting a refill of the following medications: Requested Prescriptions    No prescriptions requested or ordered in this encounter    Date last filled: 04/18/2021 Previously prescribed by Dr. UJearld Shines Lab Results  Component Value Date   HGBA1C 5.4 02/20/2021   HGBA1C 5.8 (H) 07/19/2020   HGBA1C 5.5 04/05/2020   Lab Results  Component Value Date   LDLCALC 100 (H) 02/20/2021   CREATININE 0.91 02/20/2021   Lab Results  Component Value Date   VD25OH 64.7 02/20/2021   VD25OH 62.9 07/19/2020   VD25OH 58.2 04/05/2020    BP Readings from Last 3 Encounters:  04/18/21 121/83  03/28/21 119/79  02/27/21 139/87

## 2021-05-09 NOTE — Telephone Encounter (Signed)
Rx refilled.

## 2021-05-09 NOTE — Telephone Encounter (Signed)
Rx refill request already sent to Dr. Leafy Ro for approval or denial.

## 2021-05-09 NOTE — Telephone Encounter (Signed)
Please note, pt is requesting Ozempic.

## 2021-05-09 NOTE — Telephone Encounter (Signed)
Dr.Ukleja 

## 2021-05-09 NOTE — Telephone Encounter (Signed)
Ok x 1

## 2021-05-10 ENCOUNTER — Other Ambulatory Visit (HOSPITAL_COMMUNITY): Payer: Self-pay

## 2021-05-29 ENCOUNTER — Other Ambulatory Visit: Payer: Self-pay

## 2021-05-29 ENCOUNTER — Other Ambulatory Visit (HOSPITAL_COMMUNITY): Payer: Self-pay

## 2021-05-29 ENCOUNTER — Encounter (INDEPENDENT_AMBULATORY_CARE_PROVIDER_SITE_OTHER): Payer: Self-pay | Admitting: Family Medicine

## 2021-05-29 ENCOUNTER — Ambulatory Visit (INDEPENDENT_AMBULATORY_CARE_PROVIDER_SITE_OTHER): Payer: No Typology Code available for payment source | Admitting: Family Medicine

## 2021-05-29 VITALS — BP 123/79 | HR 80 | Temp 98.2°F | Ht 66.0 in | Wt 191.0 lb

## 2021-05-29 DIAGNOSIS — E559 Vitamin D deficiency, unspecified: Secondary | ICD-10-CM | POA: Diagnosis not present

## 2021-05-29 DIAGNOSIS — R7303 Prediabetes: Secondary | ICD-10-CM | POA: Diagnosis not present

## 2021-05-29 DIAGNOSIS — E669 Obesity, unspecified: Secondary | ICD-10-CM | POA: Diagnosis not present

## 2021-05-29 DIAGNOSIS — Z6833 Body mass index (BMI) 33.0-33.9, adult: Secondary | ICD-10-CM | POA: Diagnosis not present

## 2021-05-29 DIAGNOSIS — Z9189 Other specified personal risk factors, not elsewhere classified: Secondary | ICD-10-CM

## 2021-05-29 MED ORDER — OZEMPIC (1 MG/DOSE) 4 MG/3ML ~~LOC~~ SOPN
1.0000 mg | PEN_INJECTOR | SUBCUTANEOUS | 0 refills | Status: DC
  Filled 2021-05-29: qty 3, 28d supply, fill #0

## 2021-05-30 ENCOUNTER — Other Ambulatory Visit (HOSPITAL_COMMUNITY): Payer: Self-pay

## 2021-05-30 NOTE — Progress Notes (Signed)
Chief Complaint:   OBESITY Robin Knapp is here to discuss her progress with her obesity treatment plan along with follow-up of her obesity related diagnoses. Robin Knapp is on keeping a food journal and adhering to recommended goals of 1200-1400 calories and 90+ grams protein and states she is following her eating plan approximately 75% of the time. Robin Knapp states she is walking 90 minutes 2 times per week.  Today's visit was #: 58 Starting weight: 199 lbs Starting date: 11/27/2017 Today's weight: 191 lbs Today's date: 05/29/2021 Total lbs lost to date: 8 Total lbs lost since last in-office visit: 2  Interim History: Robin Knapp is doing well with journaling and is getting 1200-1400 calories but getting between 70-80 grams protein per day. She is doing Special K with Fairlife Milk or Canadian bacon and milk or protein powder + Fairlife + PB powder; Lunch is salad with protein or sandwich or eat at the hospital; Dinner is cube steak with kale.  Subjective:   1. Pre-diabetes Robin Knapp's last A1c was 5.4 and insulin level 7.8. She is on GLP-1 with no GI side effects.  2. Vitamin D deficiency Pt denies nausea, vomiting, and muscle weakness but notes fatigue. She is on OTC Vit D 5,000 IU daily.  3. At risk for activity intolerance Robin Knapp is at risk for exercise intolerance due to decreased resistance training due to recent [place].  Assessment/Plan:   1. Pre-diabetes Robin Knapp will continue to work on weight loss, exercise, and decreasing simple carbohydrates to help decrease the risk of diabetes.   Refill- Semaglutide, 1 MG/DOSE, (OZEMPIC, 1 MG/DOSE,) 4 MG/3ML SOPN; Inject 1 mg into the skin once a week.  Dispense: 3 mL; Refill: 0  2. Vitamin D deficiency Low Vitamin D level contributes to fatigue and are associated with obesity, breast, and colon cancer. She agrees to continue to take OTC Vitamin D 5,000 IU daily and will follow-up for routine testing of Vitamin D, at least 2-3 times per year to  avoid over-replacement.  3. At risk for activity intolerance Robin Knapp was given approximately 15 minutes of exercise intolerance counseling today. She is 50 y.o. female and has risk factors exercise intolerance including obesity. We discussed intensive lifestyle modifications today with an emphasis on specific weight loss instructions and strategies. Robin Knapp will slowly increase activity as tolerated.  Repetitive spaced learning was employed today to elicit superior memory formation and behavioral change.  4. Obesity with current BMI of 31.0  Robin Knapp is currently in the action stage of change. As such, her goal is to continue with weight loss efforts. She has agreed to keeping a food journal and adhering to recommended goals of 1200-1400 calories and 90+ grams protein.   Exercise goals:  Resistance training 2-3 times a week.  Behavioral modification strategies: increasing lean protein intake, meal planning and cooking strategies, keeping healthy foods in the home, and planning for success.  Robin Knapp has agreed to follow-up with our clinic in 3 weeks. She was informed of the importance of frequent follow-up visits to maximize her success with intensive lifestyle modifications for her multiple health conditions.   Objective:   Blood pressure 123/79, pulse 80, temperature 98.2 F (36.8 C), height '5\' 6"'$  (1.676 m), weight 191 lb (86.6 kg), last menstrual period 05/13/2021, SpO2 99 %. Body mass index is 30.83 kg/m.  General: Cooperative, alert, well developed, in no acute distress. HEENT: Conjunctivae and lids unremarkable. Cardiovascular: Regular rhythm.  Lungs: Normal work of breathing. Neurologic: No focal deficits.   Lab Results  Component Value Date   CREATININE 0.91 02/20/2021   BUN 9 02/20/2021   NA 139 02/20/2021   K 4.5 02/20/2021   CL 102 02/20/2021   CO2 22 02/20/2021   Lab Results  Component Value Date   ALT 22 02/20/2021   AST 16 02/20/2021   ALKPHOS 43 (L) 02/20/2021    BILITOT 0.5 02/20/2021   Lab Results  Component Value Date   HGBA1C 5.4 02/20/2021   HGBA1C 5.8 (H) 07/19/2020   HGBA1C 5.5 04/05/2020   HGBA1C 5.4 09/24/2019   HGBA1C 5.4 01/13/2019   Lab Results  Component Value Date   INSULIN 7.8 02/20/2021   INSULIN 10.5 07/19/2020   INSULIN 12.7 04/05/2020   INSULIN 10.4 09/24/2019   INSULIN 8.2 01/13/2019   Lab Results  Component Value Date   TSH 0.667 11/27/2017   Lab Results  Component Value Date   CHOL 157 02/20/2021   HDL 43 02/20/2021   LDLCALC 100 (H) 02/20/2021   TRIG 72 02/20/2021   CHOLHDL 3.7 02/20/2021   Lab Results  Component Value Date   VD25OH 64.7 02/20/2021   VD25OH 62.9 07/19/2020   VD25OH 58.2 04/05/2020   Lab Results  Component Value Date   WBC 8.0 11/27/2017   HGB 13.6 11/27/2017   HCT 38.8 11/27/2017   MCV 86 11/27/2017   PLT 343 04/06/2015    Attestation Statements:   Reviewed by clinician on day of visit: allergies, medications, problem list, medical history, surgical history, family history, social history, and previous encounter notes.  Coral Ceo, CMA, am acting as transcriptionist for Coralie Common, MD.   I have reviewed the above documentation for accuracy and completeness, and I agree with the above. - Coralie Common, MD

## 2021-06-01 ENCOUNTER — Other Ambulatory Visit (HOSPITAL_COMMUNITY): Payer: Self-pay

## 2021-06-02 ENCOUNTER — Other Ambulatory Visit: Payer: Self-pay

## 2021-06-02 ENCOUNTER — Ambulatory Visit: Payer: No Typology Code available for payment source

## 2021-06-07 ENCOUNTER — Ambulatory Visit: Payer: No Typology Code available for payment source | Admitting: Allergy

## 2021-06-19 ENCOUNTER — Other Ambulatory Visit (HOSPITAL_COMMUNITY): Payer: Self-pay

## 2021-06-19 ENCOUNTER — Encounter (INDEPENDENT_AMBULATORY_CARE_PROVIDER_SITE_OTHER): Payer: Self-pay | Admitting: Family Medicine

## 2021-06-19 ENCOUNTER — Ambulatory Visit (INDEPENDENT_AMBULATORY_CARE_PROVIDER_SITE_OTHER): Payer: No Typology Code available for payment source | Admitting: Family Medicine

## 2021-06-19 ENCOUNTER — Other Ambulatory Visit: Payer: Self-pay

## 2021-06-19 VITALS — BP 135/81 | HR 79 | Temp 97.9°F | Ht 66.0 in | Wt 189.0 lb

## 2021-06-19 DIAGNOSIS — R7303 Prediabetes: Secondary | ICD-10-CM | POA: Diagnosis not present

## 2021-06-19 DIAGNOSIS — E7849 Other hyperlipidemia: Secondary | ICD-10-CM | POA: Diagnosis not present

## 2021-06-19 DIAGNOSIS — Z6833 Body mass index (BMI) 33.0-33.9, adult: Secondary | ICD-10-CM

## 2021-06-19 DIAGNOSIS — E669 Obesity, unspecified: Secondary | ICD-10-CM

## 2021-06-19 MED ORDER — OZEMPIC (1 MG/DOSE) 4 MG/3ML ~~LOC~~ SOPN
1.0000 mg | PEN_INJECTOR | SUBCUTANEOUS | 0 refills | Status: DC
Start: 2021-06-19 — End: 2021-07-18
  Filled 2021-06-19: qty 3, 28d supply, fill #0

## 2021-06-19 NOTE — Progress Notes (Signed)
Chief Complaint:   OBESITY Robin Knapp is here to discuss her progress with her obesity treatment plan along with follow-up of her obesity related diagnoses. Robin Knapp is on keeping a food journal and adhering to recommended goals of 1200-1400 calories and 90+ grams protein and states she is following her eating plan approximately 50% of the time. Robin Knapp states she is walking 30-60 minutes 1 times per week.  Today's visit was #: 42 Starting weight: 199 lbs Starting date: 11/27/2017 Today's weight: 189 lbs Today's date: 06/19/2021 Total lbs lost to date: 10 Total lbs lost since last in-office visit: 2  Interim History: Robin Knapp has been working quite a bit and stressing a significant amount. She thinks her mother-in-law may need to live with her. She starts out journaling initially after her appointments, then slowly decreases frequency and adherence.  Subjective:   1. Pre-diabetes Robin Knapp is on Ozempic 1 mg weekly with better control of stress eating.  2. Other hyperlipidemia Pt's last LDL was 100, HDL 43, and triglycerides 72. She is on Lipitor 20 mg daily.  Assessment/Plan:   1. Pre-diabetes Robin Knapp will continue Ozempic 1 mg weekly and to work on weight loss, exercise, and decreasing simple carbohydrates to help decrease the risk of diabetes.   Refill- Semaglutide, 1 MG/DOSE, (OZEMPIC, 1 MG/DOSE,) 4 MG/3ML SOPN; Inject 1 mg into the skin once a week.  Dispense: 3 mL; Refill: 0  2. Other hyperlipidemia Cardiovascular risk and specific lipid/LDL goals reviewed.  We discussed several lifestyle modifications today and Robin Knapp will continue to work on diet, exercise and weight loss efforts. Orders and follow up as documented in patient record. Continue Lipitor with no change in dose.  Counseling Intensive lifestyle modifications are the first line treatment for this issue. Dietary changes: Increase soluble fiber. Decrease simple carbohydrates. Exercise changes: Moderate to  vigorous-intensity aerobic activity 150 minutes per week if tolerated. Lipid-lowering medications: see documented in medical record.  3. Obesity with current BMI of 30.6  Robin Knapp is currently in the action stage of change. As such, her goal is to continue with weight loss efforts. She has agreed to keeping a food journal and adhering to recommended goals of 1200-1400 calories and 90+ grams protein.   Exercise goals: All adults should avoid inactivity. Some physical activity is better than none, and adults who participate in any amount of physical activity gain some health benefits. Do 15-20 minutes of exercise 4 times a week.  Behavioral modification strategies: increasing lean protein intake, meal planning and cooking strategies, and keeping healthy foods in the home.  Robin Knapp has agreed to follow-up with our clinic in 3-4 weeks. She was informed of the importance of frequent follow-up visits to maximize her success with intensive lifestyle modifications for her multiple health conditions.   Objective:   Blood pressure 135/81, pulse 79, temperature 97.9 F (36.6 C), height 5\' 6"  (1.676 m), weight 189 lb (85.7 kg), SpO2 100 %. Body mass index is 30.51 kg/m.  General: Cooperative, alert, well developed, in no acute distress. HEENT: Conjunctivae and lids unremarkable. Cardiovascular: Regular rhythm.  Lungs: Normal work of breathing. Neurologic: No focal deficits.   Lab Results  Component Value Date   CREATININE 0.91 02/20/2021   BUN 9 02/20/2021   NA 139 02/20/2021   K 4.5 02/20/2021   CL 102 02/20/2021   CO2 22 02/20/2021   Lab Results  Component Value Date   ALT 22 02/20/2021   AST 16 02/20/2021   ALKPHOS 43 (L) 02/20/2021  BILITOT 0.5 02/20/2021   Lab Results  Component Value Date   HGBA1C 5.4 02/20/2021   HGBA1C 5.8 (H) 07/19/2020   HGBA1C 5.5 04/05/2020   HGBA1C 5.4 09/24/2019   HGBA1C 5.4 01/13/2019   Lab Results  Component Value Date   INSULIN 7.8 02/20/2021    INSULIN 10.5 07/19/2020   INSULIN 12.7 04/05/2020   INSULIN 10.4 09/24/2019   INSULIN 8.2 01/13/2019   Lab Results  Component Value Date   TSH 0.667 11/27/2017   Lab Results  Component Value Date   CHOL 157 02/20/2021   HDL 43 02/20/2021   LDLCALC 100 (H) 02/20/2021   TRIG 72 02/20/2021   CHOLHDL 3.7 02/20/2021   Lab Results  Component Value Date   VD25OH 64.7 02/20/2021   VD25OH 62.9 07/19/2020   VD25OH 58.2 04/05/2020   Lab Results  Component Value Date   WBC 8.0 11/27/2017   HGB 13.6 11/27/2017   HCT 38.8 11/27/2017   MCV 86 11/27/2017   PLT 343 04/06/2015    Attestation Statements:   Reviewed by clinician on day of visit: allergies, medications, problem list, medical history, surgical history, family history, social history, and previous encounter notes.  Time spent on visit including pre-visit chart review and post-visit care and charting was 25 minutes.   Coral Ceo, CMA, am acting as transcriptionist for Coralie Common, MD.  I have reviewed the above documentation for accuracy and completeness, and I agree with the above. - Coralie Common, MD

## 2021-07-06 ENCOUNTER — Other Ambulatory Visit: Payer: Self-pay

## 2021-07-06 ENCOUNTER — Ambulatory Visit: Payer: No Typology Code available for payment source

## 2021-07-18 ENCOUNTER — Other Ambulatory Visit (HOSPITAL_COMMUNITY): Payer: Self-pay

## 2021-07-18 ENCOUNTER — Encounter (INDEPENDENT_AMBULATORY_CARE_PROVIDER_SITE_OTHER): Payer: Self-pay | Admitting: Family Medicine

## 2021-07-18 ENCOUNTER — Ambulatory Visit (INDEPENDENT_AMBULATORY_CARE_PROVIDER_SITE_OTHER): Payer: No Typology Code available for payment source | Admitting: Family Medicine

## 2021-07-18 ENCOUNTER — Other Ambulatory Visit: Payer: Self-pay

## 2021-07-18 VITALS — BP 129/83 | HR 87 | Temp 98.2°F | Ht 66.0 in | Wt 190.0 lb

## 2021-07-18 DIAGNOSIS — E669 Obesity, unspecified: Secondary | ICD-10-CM | POA: Diagnosis not present

## 2021-07-18 DIAGNOSIS — E559 Vitamin D deficiency, unspecified: Secondary | ICD-10-CM | POA: Diagnosis not present

## 2021-07-18 DIAGNOSIS — R7303 Prediabetes: Secondary | ICD-10-CM

## 2021-07-18 DIAGNOSIS — Z6833 Body mass index (BMI) 33.0-33.9, adult: Secondary | ICD-10-CM | POA: Diagnosis not present

## 2021-07-18 MED ORDER — OZEMPIC (1 MG/DOSE) 4 MG/3ML ~~LOC~~ SOPN
1.0000 mg | PEN_INJECTOR | SUBCUTANEOUS | 0 refills | Status: DC
  Filled 2021-07-18: qty 3, 28d supply, fill #0

## 2021-07-18 NOTE — Progress Notes (Signed)
Chief Complaint:   OBESITY Robin Knapp is here to discuss her progress with her obesity treatment plan along with follow-up of her obesity related diagnoses. Robin Knapp is on keeping a food journal and adhering to recommended goals of 1200-1400 calories and 90+ grams protein and states she is following her eating plan approximately 75-80% of the time. Robin Knapp states she is doing cardio and treadmill 30 minutes 2-3 times per week.  Today's visit was #: 59 Starting weight: 199 lbs Starting date: 11/27/2017 Today's weight: 190 lbs Today's date: 07/18/2021 Total lbs lost to date: 9 Total lbs lost since last in-office visit: 0  Interim History: Robin Knapp has been working so much over the last few weeks that she is often choosing sleep over food. Work wise, November is less busy. She thinks structure of Category 2 may be easier. She has tried to stop going to Physician lounge at the hospital to decrease choices that are there.  Subjective:   1. Pre-diabetes Robin Knapp is on Ozempic 1 mg with no GI side effects. Her last A1c was 5.4 and insulin level 7.8.  2. Vitamin D deficiency Robin Knapp is on OTC Vit D. She denies nausea, vomiting, and muscle weakness but notes fatigue. Her last Vit D level was 64.7.  Assessment/Plan:   1. Pre-diabetes Robin Knapp will continue to work on weight loss, exercise, and decreasing simple carbohydrates to help decrease the risk of diabetes.   Refill- Semaglutide, 1 MG/DOSE, (OZEMPIC, 1 MG/DOSE,) 4 MG/3ML SOPN; Inject 1 mg into the skin once a week.  Dispense: 3 mL; Refill: 0  2. Vitamin D deficiency Low Vitamin D level contributes to fatigue and are associated with obesity, breast, and colon cancer. She agrees to continue to take OTC Vitamin D daily and will follow-up for routine testing of Vitamin D, at least 2-3 times per year to avoid over-replacement. Repeat labs at next appt.  3. Obesity with current BMI of 30  Robin Knapp is currently in the action stage of change. As  such, her goal is to continue with weight loss efforts. She has agreed to the Category 2 Plan or [place], keeping a food journal and adhering to recommended goals of 1200-1400 calories and 85+ grams protein.   Exercise goals: All adults should avoid inactivity. Some physical activity is better than none, and adults who participate in any amount of physical activity gain some health benefits.  Behavioral modification strategies: increasing lean protein intake, meal planning and cooking strategies, keeping healthy foods in the home, and planning for success.  Robin Knapp has agreed to follow-up with our clinic in 3 weeks. She was informed of the importance of frequent follow-up visits to maximize her success with intensive lifestyle modifications for her multiple health conditions.   Objective:   Blood pressure 129/83, pulse 87, temperature 98.2 F (36.8 C), height 5\' 6"  (1.676 m), weight 190 lb (86.2 kg), SpO2 97 %. Body mass index is 30.67 kg/m.  General: Cooperative, alert, well developed, in no acute distress. HEENT: Conjunctivae and lids unremarkable. Cardiovascular: Regular rhythm.  Lungs: Normal work of breathing. Neurologic: No focal deficits.   Lab Results  Component Value Date   CREATININE 0.91 02/20/2021   BUN 9 02/20/2021   NA 139 02/20/2021   K 4.5 02/20/2021   CL 102 02/20/2021   CO2 22 02/20/2021   Lab Results  Component Value Date   ALT 22 02/20/2021   AST 16 02/20/2021   ALKPHOS 43 (L) 02/20/2021   BILITOT 0.5 02/20/2021  Lab Results  Component Value Date   HGBA1C 5.4 02/20/2021   HGBA1C 5.8 (H) 07/19/2020   HGBA1C 5.5 04/05/2020   HGBA1C 5.4 09/24/2019   HGBA1C 5.4 01/13/2019   Lab Results  Component Value Date   INSULIN 7.8 02/20/2021   INSULIN 10.5 07/19/2020   INSULIN 12.7 04/05/2020   INSULIN 10.4 09/24/2019   INSULIN 8.2 01/13/2019   Lab Results  Component Value Date   TSH 0.667 11/27/2017   Lab Results  Component Value Date   CHOL  157 02/20/2021   HDL 43 02/20/2021   LDLCALC 100 (H) 02/20/2021   TRIG 72 02/20/2021   CHOLHDL 3.7 02/20/2021   Lab Results  Component Value Date   VD25OH 64.7 02/20/2021   VD25OH 62.9 07/19/2020   VD25OH 58.2 04/05/2020   Lab Results  Component Value Date   WBC 8.0 11/27/2017   HGB 13.6 11/27/2017   HCT 38.8 11/27/2017   MCV 86 11/27/2017   PLT 343 04/06/2015   Attestation Statements:   Reviewed by clinician on day of visit: allergies, medications, problem list, medical history, surgical history, family history, social history, and previous encounter notes.  Coral Ceo, CMA, am acting as transcriptionist for Coralie Common, MD.  I have reviewed the above documentation for accuracy and completeness, and I agree with the above. - Coralie Common, MD

## 2021-08-08 ENCOUNTER — Encounter (INDEPENDENT_AMBULATORY_CARE_PROVIDER_SITE_OTHER): Payer: Self-pay | Admitting: Family Medicine

## 2021-08-08 ENCOUNTER — Other Ambulatory Visit: Payer: Self-pay

## 2021-08-08 ENCOUNTER — Other Ambulatory Visit (HOSPITAL_COMMUNITY): Payer: Self-pay

## 2021-08-08 ENCOUNTER — Ambulatory Visit (INDEPENDENT_AMBULATORY_CARE_PROVIDER_SITE_OTHER): Payer: No Typology Code available for payment source | Admitting: Family Medicine

## 2021-08-08 VITALS — BP 125/87 | HR 82 | Temp 97.8°F | Ht 67.0 in | Wt 188.0 lb

## 2021-08-08 DIAGNOSIS — E559 Vitamin D deficiency, unspecified: Secondary | ICD-10-CM

## 2021-08-08 DIAGNOSIS — R7303 Prediabetes: Secondary | ICD-10-CM | POA: Diagnosis not present

## 2021-08-08 DIAGNOSIS — E669 Obesity, unspecified: Secondary | ICD-10-CM | POA: Diagnosis not present

## 2021-08-08 DIAGNOSIS — E785 Hyperlipidemia, unspecified: Secondary | ICD-10-CM

## 2021-08-08 DIAGNOSIS — Z6833 Body mass index (BMI) 33.0-33.9, adult: Secondary | ICD-10-CM

## 2021-08-08 MED ORDER — OZEMPIC (1 MG/DOSE) 4 MG/3ML ~~LOC~~ SOPN
1.0000 mg | PEN_INJECTOR | SUBCUTANEOUS | 0 refills | Status: DC
  Filled 2021-08-08: qty 3, 28d supply, fill #0

## 2021-08-08 NOTE — Progress Notes (Signed)
Chief Complaint:   OBESITY Robin Knapp is here to discuss her progress with her obesity treatment plan along with follow-up of her obesity related diagnoses. Robin Knapp is on the Category 2 Plan, keeping a food journal and adhering to recommended goals of 1200-1400 calories and 85 grams protein, and the [place] and states she is following her eating plan approximately 75% of the time. Robin Knapp states she is walking and weight training 15-30 minutes 2-3 times per week.  Today's visit was #: 1 Starting weight: 199 lbs Starting date: 11/27/2017 Today's weight: 188 lbs Today's date: 08/08/2021 Total lbs lost to date: 11 Total lbs lost since last in-office visit: 2  Interim History: Pt had a low key birthday and will be working for Thanksgiving. She does not anticipate working for Christmas. Pt has been doing a mixture of all meal plans. She is refocusing on category 2. She got a bunch of frozen meals to easily pack into lunch bag and has been exercising more with low resistance but increased reps.  Subjective:   1. Pre-diabetes Robin Knapp's last A1c was 5.4 (improved from 5.8). She notes some increased drive for carbohydrates but very controlled in quantity.  2. Vitamin D deficiency Pt denies nausea, vomiting, and muscle weakness but notes fatigue. She is on OTC Vit D 5,000 IU daily.  3. Hyperlipidemia, unspecified hyperlipidemia type Pt's last LDL was 100, HDL 43, and triglycerides 72. She is on Lipitor and denies transaminitis.  Assessment/Plan:   1. Pre-diabetes Robin Knapp will continue to work on weight loss, exercise, and decreasing simple carbohydrates to help decrease the risk of diabetes. Check labs today.  Refill- Semaglutide, 1 MG/DOSE, (OZEMPIC, 1 MG/DOSE,) 4 MG/3ML SOPN; Inject 1 mg into the skin once a week.  Dispense: 3 mL; Refill: 0 - Insulin, random - Hemoglobin A1c  2. Vitamin D deficiency Low Vitamin D level contributes to fatigue and are associated with obesity,  breast, and colon cancer. She agrees to continue to take OTC Vitamin D 5,000 IU daily and will follow-up for routine testing of Vitamin D, at least 2-3 times per year to avoid over-replacement. Check labs today.  - VITAMIN D 25 Hydroxy (Vit-D Deficiency, Fractures)  3. Hyperlipidemia, unspecified hyperlipidemia type Cardiovascular risk and specific lipid/LDL goals reviewed.  We discussed several lifestyle modifications today and Robin Knapp will continue to work on diet, exercise and weight loss efforts. Orders and follow up as documented in patient record.   Counseling Intensive lifestyle modifications are the first line treatment for this issue. Dietary changes: Increase soluble fiber. Decrease simple carbohydrates. Exercise changes: Moderate to vigorous-intensity aerobic activity 150 minutes per week if tolerated. Lipid-lowering medications: see documented in medical record. Check labs today.  - Comprehensive metabolic panel - Lipid Panel With LDL/HDL Ratio  4. Obesity with current BMI of 30.5  Robin Knapp is currently in the action stage of change. As such, her goal is to continue with weight loss efforts. She has agreed to the Category 2 Plan, keeping a food journal and adhering to recommended goals of 1200-1400 calories and 85+ grams protein, and the [place].   Exercise goals:  As is  Behavioral modification strategies: increasing lean protein intake, no skipping meals, meal planning and cooking strategies, and planning for success.  Robin Knapp has agreed to follow-up with our clinic in 3-4 weeks. She was informed of the importance of frequent follow-up visits to maximize her success with intensive lifestyle modifications for her multiple health conditions.   Robin Knapp was informed we would  discuss her lab results at her next visit unless there is a critical issue that needs to be addressed sooner. Robin Knapp agreed to keep her next visit at the agreed upon time to discuss these  results.  Objective:   Blood pressure 125/87, pulse 82, temperature 97.8 F (36.6 C), height 5\' 7"  (1.702 m), weight 188 lb (85.3 kg), last menstrual period 07/28/2021, SpO2 98 %. Body mass index is 29.44 kg/m.  General: Cooperative, alert, well developed, in no acute distress. HEENT: Conjunctivae and lids unremarkable. Cardiovascular: Regular rhythm.  Lungs: Normal work of breathing. Neurologic: No focal deficits.   Lab Results  Component Value Date   CREATININE 0.91 02/20/2021   BUN 9 02/20/2021   NA 139 02/20/2021   K 4.5 02/20/2021   CL 102 02/20/2021   CO2 22 02/20/2021   Lab Results  Component Value Date   ALT 22 02/20/2021   AST 16 02/20/2021   ALKPHOS 43 (L) 02/20/2021   BILITOT 0.5 02/20/2021   Lab Results  Component Value Date   HGBA1C 5.4 02/20/2021   HGBA1C 5.8 (H) 07/19/2020   HGBA1C 5.5 04/05/2020   HGBA1C 5.4 09/24/2019   HGBA1C 5.4 01/13/2019   Lab Results  Component Value Date   INSULIN 7.8 02/20/2021   INSULIN 10.5 07/19/2020   INSULIN 12.7 04/05/2020   INSULIN 10.4 09/24/2019   INSULIN 8.2 01/13/2019   Lab Results  Component Value Date   TSH 0.667 11/27/2017   Lab Results  Component Value Date   CHOL 157 02/20/2021   HDL 43 02/20/2021   LDLCALC 100 (H) 02/20/2021   TRIG 72 02/20/2021   CHOLHDL 3.7 02/20/2021   Lab Results  Component Value Date   VD25OH 64.7 02/20/2021   VD25OH 62.9 07/19/2020   VD25OH 58.2 04/05/2020   Lab Results  Component Value Date   WBC 8.0 11/27/2017   HGB 13.6 11/27/2017   HCT 38.8 11/27/2017   MCV 86 11/27/2017   PLT 343 04/06/2015    Attestation Statements:   Reviewed by clinician on day of visit: allergies, medications, problem list, medical history, surgical history, family history, social history, and previous encounter notes.  Coral Ceo, CMA, am acting as transcriptionist for Coralie Common, MD.   I have reviewed the above documentation for accuracy and completeness, and I  agree with the above. - Coralie Common, MD

## 2021-08-09 ENCOUNTER — Other Ambulatory Visit (HOSPITAL_COMMUNITY): Payer: Self-pay

## 2021-08-09 LAB — COMPREHENSIVE METABOLIC PANEL
ALT: 24 IU/L (ref 0–32)
AST: 16 IU/L (ref 0–40)
Albumin/Globulin Ratio: 1.9 (ref 1.2–2.2)
Albumin: 4.5 g/dL (ref 3.8–4.8)
Alkaline Phosphatase: 39 IU/L — ABNORMAL LOW (ref 44–121)
BUN/Creatinine Ratio: 16 (ref 9–23)
BUN: 14 mg/dL (ref 6–24)
Bilirubin Total: 0.4 mg/dL (ref 0.0–1.2)
CO2: 25 mmol/L (ref 20–29)
Calcium: 9.1 mg/dL (ref 8.7–10.2)
Chloride: 105 mmol/L (ref 96–106)
Creatinine, Ser: 0.87 mg/dL (ref 0.57–1.00)
Globulin, Total: 2.4 g/dL (ref 1.5–4.5)
Glucose: 83 mg/dL (ref 70–99)
Potassium: 4.7 mmol/L (ref 3.5–5.2)
Sodium: 143 mmol/L (ref 134–144)
Total Protein: 6.9 g/dL (ref 6.0–8.5)
eGFR: 82 mL/min/{1.73_m2} (ref >59)

## 2021-08-09 LAB — HEMOGLOBIN A1C
Est. average glucose Bld gHb Est-mCnc: 111 mg/dL
Hgb A1c MFr Bld: 5.5 % (ref 4.8–5.6)

## 2021-08-09 LAB — VITAMIN D 25 HYDROXY (VIT D DEFICIENCY, FRACTURES): Vit D, 25-Hydroxy: 56.4 ng/mL (ref 30.0–100.0)

## 2021-08-09 LAB — INSULIN, RANDOM: INSULIN: 19 u[IU]/mL (ref 2.6–24.9)

## 2021-08-09 LAB — LIPID PANEL WITH LDL/HDL RATIO
Cholesterol, Total: 172 mg/dL (ref 100–199)
HDL: 41 mg/dL (ref >39)
LDL Chol Calc (NIH): 120 mg/dL — ABNORMAL HIGH (ref 0–99)
LDL/HDL Ratio: 2.9 ratio (ref 0.0–3.2)
Triglycerides: 58 mg/dL (ref 0–149)
VLDL Cholesterol Cal: 11 mg/dL (ref 5–40)

## 2021-08-24 ENCOUNTER — Ambulatory Visit: Payer: No Typology Code available for payment source | Admitting: Allergy

## 2021-08-24 ENCOUNTER — Other Ambulatory Visit: Payer: Self-pay

## 2021-08-24 ENCOUNTER — Encounter: Payer: Self-pay | Admitting: Allergy

## 2021-08-24 VITALS — BP 130/82 | HR 83 | Temp 97.4°F | Resp 16 | Ht 65.5 in | Wt 192.4 lb

## 2021-08-24 DIAGNOSIS — J3089 Other allergic rhinitis: Secondary | ICD-10-CM

## 2021-08-24 DIAGNOSIS — L578 Other skin changes due to chronic exposure to nonionizing radiation: Secondary | ICD-10-CM

## 2021-08-24 DIAGNOSIS — L2489 Irritant contact dermatitis due to other agents: Secondary | ICD-10-CM | POA: Diagnosis not present

## 2021-08-24 DIAGNOSIS — J4530 Mild persistent asthma, uncomplicated: Secondary | ICD-10-CM

## 2021-08-24 MED ORDER — ALBUTEROL SULFATE HFA 108 (90 BASE) MCG/ACT IN AERS
2.0000 | INHALATION_SPRAY | RESPIRATORY_TRACT | 1 refills | Status: DC | PRN
  Filled 2021-08-24: qty 18, 25d supply, fill #0

## 2021-08-24 NOTE — Progress Notes (Signed)
Today continues to   Follow-up Note  RE: GENOWEFA Knapp MRN: [id number] DOB: 06-28-1971 Date of Office Visit: 08/24/2021   History of present illness: Robin Knapp is a 50 y.o. female presenting today for follow-up of allergic rhinitis, asthma and dermatitis.  She was last seen in the office on 12/02/2020 by myself.  She has done well without any major health changes, surgeries or hospitalizations since her last visit. She did have COVID in July where she had cough, body aches and fatigue and congestion. In regards to her allergic rhinitis this has been doing well without any significant symptoms.  She does take Allegra daily and I will use Flonase daily as well.  This controls her symptoms well. With mild asthma she has not required use of albuterol since her last visit.  Even with COVID illness she states she never needed to use the albuterol.  She has not been having any daytime or nighttime symptoms.  Has not required any ED or urgent care visits or systemic steroid needs. She has seen a dermatologist in regards to dermatitis especially to sun exposure.  She was prescribed minocycline to help with this as she states she believes the rash was a pityriasis type rash.  Review of systems in the past 4 weeks: Review of Systems  Constitutional: Negative.   HENT: Negative.    Eyes: Negative.   Respiratory: Negative.    Cardiovascular: Negative.   Gastrointestinal: Negative.   Musculoskeletal: Negative.   Skin: Negative.   Allergic/Immunologic: Negative.   Neurological: Negative.     All other systems negative unless noted above in HPI  Past medical/social/surgical/family history have been reviewed and are unchanged unless specifically indicated below.  No changes  Medication List: Current Outpatient Medications  Medication Sig Dispense Refill   acetaminophen (TYLENOL) 650 MG CR tablet Take 650 mg by mouth every 8 (eight) hours as needed for pain.      atorvastatin (LIPITOR) 20  MG tablet Take 1 tablet (20 mg total) by mouth daily. 90 tablet 3   cholecalciferol (VITAMIN D3) 25 MCG (1000 UNIT) tablet Take 5,000 Units by mouth daily.     fexofenadine (ALLEGRA) 180 MG tablet Take 180 mg by mouth daily.      Fluticasone Furoate 50 MCG/ACT AEPB Inhale 2 sprays into the lungs daily.      ibuprofen (ADVIL,MOTRIN) 600 MG tablet Take 600 mg by mouth every 6 (six) hours as needed.     levonorgestrel (MIRENA) 20 MCG/24HR IUD 1 each by Intrauterine route once.     Magnesium 250 MG TABS Take 1 tablet by mouth daily.     naproxen (NAPROSYN) 250 MG tablet Take 500 mg by mouth 2 (two) times daily with a meal.     Semaglutide, 1 MG/DOSE, (OZEMPIC, 1 MG/DOSE,) 4 MG/3ML SOPN Inject 1 mg into the skin once a week. 3 mL 0   Turmeric 500 MG CAPS Take 1 tablet by mouth daily.     albuterol (VENTOLIN HFA) 108 (90 Base) MCG/ACT inhaler INHALE 2 PUFFS INTO THE LUNGS EVERY 4 (FOUR) HOURS AS NEEDED FOR WHEEZING OR SHORTNESS OF BREATH. 18 g 1   No current facility-administered medications for this visit.     Known medication allergies: Allergies  Allergen Reactions   Sulfa Antibiotics Hives    itching   Sunscreens    Thimerosal Hives and Itching     Physical examination: Blood pressure 130/82, pulse 83, temperature (!) 97.4 F (36.3 C), temperature source Temporal, resp. rate  16, height 5' 5.5" (1.664 m), weight 192 lb 6.4 oz (87.3 kg), last menstrual period 07/28/2021, SpO2 99 %.  General: Alert, interactive, in no acute distress. HEENT: PERRLA, TMs pearly gray, turbinates non-edematous without discharge, post-pharynx non erythematous. Neck: Supple without lymphadenopathy. Lungs: Clear to auscultation without wheezing, rhonchi or rales. {no increased work of breathing. CV: Normal S1, S2 without murmurs. Abdomen: Nondistended, nontender. Skin: Warm and dry, without lesions or rashes. Extremities:  No clubbing, cyanosis or edema. Neuro:   Grossly intact.  Diagnositics/Labs: None  today  Assessment and plan:    Allergic rinitis  - continue avoidance measures for grass pollen, weed pollen, tree pollen, mold, dust mite, cat - continue Allegra 180mg  daily as needed - continue Flonase 2 sprays each nostril daily.  If worsened congestion can use twice a day for several days until improved then decrease back to daily use.  - use nasal saline spray and blow nose well prior to use of medicated nasal sprays - allergen immunotherapy has been discussed including protocol, benefits and risk if medication management is not effective.     Mild persistent asthma - have access to albuterol inhaler 2 puffs every 4-6 hours as needed for cough/wheeze/shortness of breath/chest tightness.  May use 15-20 minutes prior to activity.   Monitor frequency of use.    Contact dermatitis/solar dermatitis - for known sun exposure recommend doubling your Allegra dose (2 tabs) and also take Pepcid 20-40mg  to help minimize your histamine response  -Continue recommendations as advised by dermatology  Follow-up 12 months or sooner if needed  I appreciate the opportunity to take part in Maddilyn's care. Please do not hesitate to contact me with questions.  Sincerely,   Prudy Feeler, MD Allergy/Immunology [facility]

## 2021-08-24 NOTE — Patient Instructions (Signed)
Allergic rinitis  - continue avoidance measures for grass pollen, weed pollen, tree pollen, mold, dust mite, cat - continue Allegra 180mg  daily as needed - continue Flonase 2 sprays each nostril daily.  If worsened congestion can use twice a day for several days until improved then decrease back to daily use.  - use nasal saline spray and blow nose well prior to use of medicated nasal sprays - allergen immunotherapy has been discussed including protocol, benefits and risk if medication management is not effective.     Mild persistent asthma - have access to albuterol inhaler 2 puffs every 4-6 hours as needed for cough/wheeze/shortness of breath/chest tightness.  May use 15-20 minutes prior to activity.   Monitor frequency of use.    Contact dermatitis/solar dermatitis - for known sun exposure recommend doubling your Allegra dose (2 tabs) and also take Pepcid 20-40mg  to help minimize your histamine response  -Continue recommendations as advised by dermatology  Follow-up 6 months or sooner if needed

## 2021-08-25 ENCOUNTER — Other Ambulatory Visit (HOSPITAL_COMMUNITY): Payer: Self-pay

## 2021-08-30 ENCOUNTER — Ambulatory Visit: Payer: No Typology Code available for payment source | Admitting: Cardiology

## 2021-08-31 ENCOUNTER — Encounter (INDEPENDENT_AMBULATORY_CARE_PROVIDER_SITE_OTHER): Payer: Self-pay | Admitting: Family Medicine

## 2021-08-31 ENCOUNTER — Other Ambulatory Visit (HOSPITAL_COMMUNITY): Payer: Self-pay

## 2021-08-31 ENCOUNTER — Other Ambulatory Visit: Payer: Self-pay

## 2021-08-31 ENCOUNTER — Ambulatory Visit (INDEPENDENT_AMBULATORY_CARE_PROVIDER_SITE_OTHER): Payer: No Typology Code available for payment source | Admitting: Family Medicine

## 2021-08-31 VITALS — BP 121/83 | HR 74 | Temp 98.1°F | Ht 67.0 in | Wt 188.0 lb

## 2021-08-31 DIAGNOSIS — E669 Obesity, unspecified: Secondary | ICD-10-CM | POA: Diagnosis not present

## 2021-08-31 DIAGNOSIS — E7849 Other hyperlipidemia: Secondary | ICD-10-CM | POA: Diagnosis not present

## 2021-08-31 DIAGNOSIS — Z6833 Body mass index (BMI) 33.0-33.9, adult: Secondary | ICD-10-CM

## 2021-08-31 DIAGNOSIS — R7303 Prediabetes: Secondary | ICD-10-CM

## 2021-08-31 MED ORDER — OZEMPIC (1 MG/DOSE) 4 MG/3ML ~~LOC~~ SOPN
1.0000 mg | PEN_INJECTOR | SUBCUTANEOUS | 0 refills | Status: DC
  Filled 2021-08-31: qty 3, 28d supply, fill #0

## 2021-09-04 NOTE — Progress Notes (Signed)
Chief Complaint:   OBESITY Robin Knapp is here to discuss her progress with her obesity treatment plan along with follow-up of her obesity related diagnoses. Robin Knapp is on the Category 2 Plan and keeping a food journal and adhering to recommended goals of 1200-1400 calories and 85+ grams protein and states she is following her eating plan approximately 50% of the time. Robin Knapp states she is not currently exercising  Today's visit was #: 81 Starting weight: 199 lbs Starting date: 11/27/2017 Today's weight: 188 lbs Today's date: 08/31/2021 Total lbs lost to date: 11 Total lbs lost since last in-office visit: 0  Interim History: Robin Knapp worked over Thanksgiving. Her mother-in-law is currently staying with pt and her family. This has added a lot of stress to pt's home life. She is skipping meals to busy herself and not ruminate. Pt is interested in pursuing counseling. She has been doing some stress/emotional eating.  Subjective:   1. Pre-diabetes Pt is on Ozempic 1 mg and reports some control of intake.  2. Other hyperlipidemia Robin Knapp's LDL has increased of 120, HDL 41, and triglycerides 58. She is on Lipitor. LFTs within normal limits.  Assessment/Plan:   1. Pre-diabetes Robin Knapp will continue to work on weight loss, exercise, and decreasing simple carbohydrates to help decrease the risk of diabetes.   Refill- Semaglutide, 1 MG/DOSE, (OZEMPIC, 1 MG/DOSE,) 4 MG/3ML SOPN; Inject 1 mg into the skin once a week.  Dispense: 3 mL; Refill: 0  2. Other hyperlipidemia Cardiovascular risk and specific lipid/LDL goals reviewed.  We discussed several lifestyle modifications today and Robin Knapp will continue to work on diet, exercise and weight loss efforts. Orders and follow up as documented in patient record. Continue Lipitor. No change in meds or dose. Check labs in March 2023.  Counseling Intensive lifestyle modifications are the first line treatment for this issue. Dietary changes: Increase  soluble fiber. Decrease simple carbohydrates. Exercise changes: Moderate to vigorous-intensity aerobic activity 150 minutes per week if tolerated. Lipid-lowering medications: see documented in medical record.  3. Obesity with current BMI of 29.4  Robin Knapp is currently in the action stage of change. As such, her goal is to continue with weight loss efforts. She has agreed to the Category 2 Plan and keeping a food journal and adhering to recommended goals of 1200-1400 calories and 85+ grams protein.   Exercise goals: All adults should avoid inactivity. Some physical activity is better than none, and adults who participate in any amount of physical activity gain some health benefits.  Behavioral modification strategies: increasing lean protein intake, meal planning and cooking strategies, and keeping healthy foods in the home.  Robin Knapp has agreed to follow-up with our clinic in 4 weeks. She was informed of the importance of frequent follow-up visits to maximize her success with intensive lifestyle modifications for her multiple health conditions.   Objective:   Blood pressure 121/83, pulse 74, temperature 98.1 F (36.7 C), height 5\' 7"  (1.702 m), weight 188 lb (85.3 kg), SpO2 99 %. Body mass index is 29.44 kg/m.  General: Cooperative, alert, well developed, in no acute distress. HEENT: Conjunctivae and lids unremarkable. Cardiovascular: Regular rhythm.  Lungs: Normal work of breathing. Neurologic: No focal deficits.   Lab Results  Component Value Date   CREATININE 0.87 08/08/2021   BUN 14 08/08/2021   NA 143 08/08/2021   K 4.7 08/08/2021   CL 105 08/08/2021   CO2 25 08/08/2021   Lab Results  Component Value Date   ALT 24 08/08/2021  AST 16 08/08/2021   ALKPHOS 39 (L) 08/08/2021   BILITOT 0.4 08/08/2021   Lab Results  Component Value Date   HGBA1C 5.5 08/08/2021   HGBA1C 5.4 02/20/2021   HGBA1C 5.8 (H) 07/19/2020   HGBA1C 5.5 04/05/2020   HGBA1C 5.4 09/24/2019   Lab  Results  Component Value Date   INSULIN 19.0 08/08/2021   INSULIN 7.8 02/20/2021   INSULIN 10.5 07/19/2020   INSULIN 12.7 04/05/2020   INSULIN 10.4 09/24/2019   Lab Results  Component Value Date   TSH 0.667 11/27/2017   Lab Results  Component Value Date   CHOL 172 08/08/2021   HDL 41 08/08/2021   LDLCALC 120 (H) 08/08/2021   TRIG 58 08/08/2021   CHOLHDL 3.7 02/20/2021   Lab Results  Component Value Date   VD25OH 56.4 08/08/2021   VD25OH 64.7 02/20/2021   VD25OH 62.9 07/19/2020   Lab Results  Component Value Date   WBC 8.0 11/27/2017   HGB 13.6 11/27/2017   HCT 38.8 11/27/2017   MCV 86 11/27/2017   PLT 343 04/06/2015    Attestation Statements:   Reviewed by clinician on day of visit: allergies, medications, problem list, medical history, surgical history, family history, social history, and previous encounter notes.  Coral Ceo, CMA, am acting as transcriptionist for Coralie Common, MD.  I have reviewed the above documentation for accuracy and completeness, and I agree with the above. - Coralie Common, MD

## 2021-09-06 ENCOUNTER — Other Ambulatory Visit (HOSPITAL_COMMUNITY): Payer: Self-pay

## 2021-09-25 ENCOUNTER — Encounter: Payer: Self-pay | Admitting: Cardiology

## 2021-09-25 ENCOUNTER — Other Ambulatory Visit: Payer: Self-pay

## 2021-09-25 ENCOUNTER — Other Ambulatory Visit (HOSPITAL_COMMUNITY): Payer: Self-pay

## 2021-09-25 ENCOUNTER — Ambulatory Visit: Payer: No Typology Code available for payment source | Admitting: Cardiology

## 2021-09-25 VITALS — BP 130/80 | HR 89 | Temp 98.2°F | Resp 16 | Ht 67.0 in | Wt 195.0 lb

## 2021-09-25 DIAGNOSIS — E7800 Pure hypercholesterolemia, unspecified: Secondary | ICD-10-CM

## 2021-09-25 DIAGNOSIS — R931 Abnormal findings on diagnostic imaging of heart and coronary circulation: Secondary | ICD-10-CM

## 2021-09-25 DIAGNOSIS — I10 Essential (primary) hypertension: Secondary | ICD-10-CM

## 2021-09-25 MED ORDER — ATORVASTATIN CALCIUM 20 MG PO TABS
20.0000 mg | ORAL_TABLET | Freq: Every day | ORAL | 0 refills | Status: DC
  Filled 2021-09-25 – 2021-11-03 (×2): qty 90, 90d supply, fill #0

## 2021-09-25 NOTE — Progress Notes (Signed)
Primary Physician/Referring:  Vernie Shanks, MD  Patient ID: Robin Knapp, female    DOB: Feb 06, 1971, 51 y.o.   MRN: [id number]  Chief Complaint  Patient presents with   Follow-up    6 month   Hyperlipidemia   elevated coronary calcium score   HPI:    Robin Knapp  is a 51 y.o. African-American patient who is a Chief Executive Officer by profession, referred to me for evaluation of abnormal coronary calcium score.  Patient is essentially asymptomatic and has hypertension and hyperlipidemia and is presently on therapy for same.  This is a 50-month office visit for hypertension and hyperlipidemia.  She is tolerating increased dose of Lipitor but states that since she thought she has to take it in the evening she has been forgetting to take the medication sometimes.  Otherwise no other specific symptoms.  Past Medical History:  Diagnosis Date   Allergy    Asthma    Breast lump    Constipation    Dry skin    Fatigue    GERD (gastroesophageal reflux disease)    pregnancy related-zantac   HTN (hypertension)    Hyperlipidemia    Infertility, female    Joint pain    Lactose intolerance    Missed abortion    6weeks 3days   Muscle pain    Obesity    Palpitations    PCOS (polycystic ovarian syndrome)    Seasonal allergies    Skin rash    Stress    Vitamin D deficiency    Past Surgical History:  Procedure Laterality Date   BREAST BIOPSY     CESAREAN SECTION     DILATION AND EVACUATION  05/09/2012   Procedure: DILATATION AND EVACUATION;  Surgeon: Marvene Staff, MD;  Location: [facility];  Service: Gynecology;  Laterality: N/A;   EYE SURGERY     LASIK     [place]     WISDOM TOOTH EXTRACTION  1994   Family History  Problem Relation Age of Onset   Hyperlipidemia Mother    Hyperlipidemia Father    Hypertension Father    Anxiety disorder Father    Hyperlipidemia Maternal Grandmother    Hypertension Maternal Grandmother    Heart disease  Maternal Grandmother    Heart disease Maternal Grandfather    Diabetes Maternal Grandfather    Stroke Paternal Grandmother    Mental illness Paternal Grandmother    Heart disease Paternal Grandmother     Social History   Tobacco Use   Smoking status: Never   Smokeless tobacco: Never  Substance Use Topics   Alcohol use: Yes    Alcohol/week: 1.0 standard drink    Types: 1 Glasses of wine per week    Comment: rarely   Marital Status: Married  ROS  Review of Systems  Cardiovascular:  Negative for chest pain, dyspnea on exertion and leg swelling.  Gastrointestinal:  Negative for melena.  Objective  Blood pressure 130/80, pulse 89, temperature 98.2 F (36.8 C), temperature source Temporal, resp. rate 16, height 5\' 7"  (1.702 m), weight 195 lb (88.5 kg), SpO2 97 %. Body mass index is 30.54 kg/m.  Vitals with BMI 09/25/2021 08/31/2021 08/24/2021  Height 5\' 7"  5\' 7"  5' 5.5"  Weight 195 lbs 188 lbs 192 lbs 6 oz  BMI 30.53 44.81 85.63  Systolic 149 702 637  Diastolic 80 83 82  Pulse 89 74 83     Physical Exam Neck:     Vascular: No carotid bruit or  JVD.  Cardiovascular:     Rate and Rhythm: Normal rate and regular rhythm.     Pulses: Intact distal pulses.     Heart sounds: Murmur heard.  Crescendo-decrescendo mid to late systolic murmur is present with a grade of 2/6 at the apex.    No gallop.  Pulmonary:     Effort: Pulmonary effort is normal.     Breath sounds: Normal breath sounds.  Abdominal:     General: Bowel sounds are normal.     Palpations: Abdomen is soft.  Musculoskeletal:        General: No swelling.     Laboratory examination:   Recent Labs    02/20/21 1119 08/08/21 1110  NA 139 143  K 4.5 4.7  CL 102 105  CO2 22 25  GLUCOSE 75 83  BUN 9 14  CREATININE 0.91 0.87  CALCIUM 9.2 9.1   CrCl cannot be calculated (Patient's most recent lab result is older than the maximum 21 days allowed.).  CMP Latest Ref Rng & Units 08/08/2021 02/20/2021 07/19/2020   Glucose 70 - 99 mg/dL 83 75 87  BUN 6 - 24 mg/dL 14 9 12   Creatinine 0.57 - 1.00 mg/dL 0.87 0.91 0.82  Sodium 134 - 144 mmol/L 143 139 139  Potassium 3.5 - 5.2 mmol/L 4.7 4.5 4.3  Chloride 96 - 106 mmol/L 105 102 101  CO2 20 - 29 mmol/L 25 22 26   Calcium 8.7 - 10.2 mg/dL 9.1 9.2 9.5  Total Protein 6.0 - 8.5 g/dL 6.9 7.3 7.4  Total Bilirubin 0.0 - 1.2 mg/dL 0.4 0.5 0.6  Alkaline Phos 44 - 121 IU/L 39(L) 43(L) 40(L)  AST 0 - 40 IU/L 16 16 16   ALT 0 - 32 IU/L 24 22 17    CBC Latest Ref Rng & Units 11/27/2017 04/06/2015 05/09/2012  WBC 3.4 - 10.8 x10E3/uL 8.0 10.4 9.0  Hemoglobin 11.1 - 15.9 g/dL 13.6 13.9 12.4  Hematocrit 34.0 - 46.6 % 38.8 41.2 36.9  Platelets 150 - 400 K/uL - 343 252    Lipid Panel Recent Labs    02/20/21 1119 08/08/21 1110  CHOL 157 172  TRIG 72 58  LDLCALC 100* 120*  HDL 43 41  CHOLHDL 3.7  --    Lipid Panel     Component Value Date/Time   CHOL 172 08/08/2021 1110   TRIG 58 08/08/2021 1110   HDL 41 08/08/2021 1110   CHOLHDL 3.7 02/20/2021 1119   LDLCALC 120 (H) 08/08/2021 1110   LABVLDL 11 08/08/2021 1110     HEMOGLOBIN A1C Lab Results  Component Value Date   HGBA1C 5.5 08/08/2021   TSH No results for input(s): TSH in the last 8760 hours. Medications and allergies   Allergies  Allergen Reactions   Sulfa Antibiotics Hives    itching   Sunscreens    Thimerosal Hives and Itching    Medication prior to this encounter:   Outpatient Medications Prior to Visit  Medication Sig Dispense Refill   acetaminophen (TYLENOL) 650 MG CR tablet Take 650 mg by mouth every 8 (eight) hours as needed for pain.      albuterol (VENTOLIN HFA) 108 (90 Base) MCG/ACT inhaler INHALE 2 PUFFS INTO THE LUNGS EVERY 4 (FOUR) HOURS AS NEEDED FOR WHEEZING OR SHORTNESS OF BREATH. 18 g 1   Cholecalciferol (VITAMIN D) 125 MCG (5000 UT) CAPS Take 5,000 Units by mouth daily.     fexofenadine (ALLEGRA) 180 MG tablet Take 180 mg by mouth daily.  Fluticasone Furoate  50 MCG/ACT AEPB Inhale 2 sprays into the lungs daily.      ibuprofen (ADVIL,MOTRIN) 600 MG tablet Take 600 mg by mouth every 6 (six) hours as needed.     levonorgestrel (MIRENA) 20 MCG/24HR IUD 1 each by Intrauterine route once.     Magnesium 250 MG TABS Take 1 tablet by mouth daily.     naproxen (NAPROSYN) 250 MG tablet Take 500 mg by mouth 2 (two) times daily with a meal.     Semaglutide, 1 MG/DOSE, (OZEMPIC, 1 MG/DOSE,) 4 MG/3ML SOPN Inject 1 mg into the skin once a week. 3 mL 0   Turmeric 500 MG CAPS Take 1 tablet by mouth daily.     atorvastatin (LIPITOR) 20 MG tablet Take 1 tablet (20 mg total) by mouth daily. 90 tablet 3   No facility-administered medications prior to visit.     FINAL MEDICATION AS OF TODAY:   Medications after current encounter Current Outpatient Medications  Medication Instructions   acetaminophen (TYLENOL) 650 mg, Oral, Every 8 hours PRN   albuterol (VENTOLIN HFA) 108 (90 Base) MCG/ACT inhaler INHALE 2 PUFFS INTO THE LUNGS EVERY 4 (FOUR) HOURS AS NEEDED FOR WHEEZING OR SHORTNESS OF BREATH.   atorvastatin (LIPITOR) 20 mg, Oral, Daily   fexofenadine (ALLEGRA) 180 mg, Oral, Daily   Fluticasone Furoate 50 MCG/ACT AEPB 2 sprays, Inhalation, Daily   ibuprofen (ADVIL) 600 mg, Oral, Every 6 hours PRN   levonorgestrel (MIRENA) 20 MCG/24HR IUD 1 each, Intrauterine,  Once   Magnesium 250 MG TABS 1 tablet, Oral, Daily   naproxen (NAPROSYN) 500 mg, Oral, 2 times daily with meals   Ozempic (1 MG/DOSE) 1 mg, Subcutaneous, Weekly   Turmeric 500 MG CAPS 1 tablet, Oral, Daily   Vitamin D 5,000 Units, Oral, Daily    Radiology:   No results found.  Cardiac Studies:   Coronary calcium score 12/30/2020: Total coronary calcium score 3.05, this was 89th percentile for age, race and sex matched controls. Normal coronary origin, all calcification noted in the right coronary artery.  Ascending aorta normal.  Extracardiac findings: Sclerotic lesion in the  sternum. This is nonspecific, but the possibility of a metastatic lesion should be considered. Correlation with results of forthcoming mammography is recommended, and consideration for further evaluation with bone scan or PET-CT would be recommended should mammography raise concern for underlying malignancy.  PCV CARDIAC STRESS TEST 07/06/2021 Exercise treadmill stress test performed using Bruce protocol.  Patient reached 11.1 METS, and 113% of age predicted maximum heart rate.  Exercise capacity was excellent.  No chest pain reported.  Normal heart rate and hemodynamic response. Stress EKG revealed no ischemic changes. Low risk study.   PCV ECHOCARDIOGRAM COMPLETE 16/06/9603 Normal LV systolic function with visual EF 55-60%. Left ventricle cavity is normal in size. Normal left ventricular wall thickness. Normal global wall motion. Normal diastolic filling pattern, normal LAP. Mild (Grade I) mitral regurgitation. Mild tricuspid regurgitation. No evidence of pulmonary hypertension. Mild pulmonic regurgitation. No prior study for comparison.     EKG:   EKG 09/25/2021: Normal sinus rhythm at rate of 90 bpm, normal axis, poor R progression, probably normal variant.  No evidence of ischemia, normal QT interval.  No significant change from 03/16/2021.    Assessment     ICD-10-CM   1. Primary hypertension  I10 EKG 12-Lead    2. Elevated coronary artery calcium score  R93.1     3. Hypercholesteremia  E78.00 atorvastatin (LIPITOR) 20 MG tablet  Medications Discontinued During This Encounter  Medication Reason   atorvastatin (LIPITOR) 20 MG tablet Reorder    Meds ordered this encounter  Medications   atorvastatin (LIPITOR) 20 MG tablet    Sig: Take 1 tablet (20 mg total) by mouth daily.    Dispense:  90 tablet    Refill:  0    Refills to Dr. Jacelyn Grip, [place] This Encounter  Procedures   EKG 12-Lead   Recommendations:   Robin Knapp is a 51 y.o.  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  She is essentially asymptomatic.  I reviewed her lipids, her LDL goal is <100 and I will prefer to be closer to 70 if possible.  I did increase the dose of atorvastatin from 10 mg to 20 mg however her LDL has risen, on further questioning patient has been missing her doses as she thought that she should take it in the evening.  Advised her that she can take Lipitor at any time during the day.  Hence she will be more regular with the medication, she has an appointment with her PCP in 2 months and lipids can be checked at that time.  Either Lipitor can be increased to 40 mg or addition of Zetia would certainly get her LDL to goal if still elevated.  Blood pressures well controlled, she has started losing weight presently on Ozempic, she is also trying her best with exercise program as well.  With regard to mitral regurgitation, she does have very mild mitral regurgitation, probably mitral valve prolapse although not noted on the echocardiogram, no further evaluation is indicated with regard to this unless murmur gets to be very loud or she develops any symptoms of dyspnea.  No endocarditis prophylaxis is indicated.  From cardiac status she is stable, I will see her back on a as needed basis.      Adrian Prows, MD, [facility] 09/25/2021, 2:18 PM Office: [phone]

## 2021-09-27 ENCOUNTER — Ambulatory Visit (INDEPENDENT_AMBULATORY_CARE_PROVIDER_SITE_OTHER): Payer: No Typology Code available for payment source | Admitting: Family Medicine

## 2021-10-03 ENCOUNTER — Other Ambulatory Visit (HOSPITAL_COMMUNITY): Payer: Self-pay

## 2021-10-04 ENCOUNTER — Other Ambulatory Visit (HOSPITAL_COMMUNITY): Payer: Self-pay

## 2021-10-04 ENCOUNTER — Encounter (INDEPENDENT_AMBULATORY_CARE_PROVIDER_SITE_OTHER): Payer: Self-pay | Admitting: Family Medicine

## 2021-10-04 ENCOUNTER — Other Ambulatory Visit: Payer: Self-pay

## 2021-10-04 ENCOUNTER — Ambulatory Visit (INDEPENDENT_AMBULATORY_CARE_PROVIDER_SITE_OTHER): Payer: No Typology Code available for payment source | Admitting: Family Medicine

## 2021-10-04 VITALS — BP 127/86 | HR 94 | Temp 98.4°F | Ht 67.0 in | Wt 191.0 lb

## 2021-10-04 DIAGNOSIS — Z6830 Body mass index (BMI) 30.0-30.9, adult: Secondary | ICD-10-CM | POA: Diagnosis not present

## 2021-10-04 DIAGNOSIS — E669 Obesity, unspecified: Secondary | ICD-10-CM | POA: Diagnosis not present

## 2021-10-04 DIAGNOSIS — R7303 Prediabetes: Secondary | ICD-10-CM | POA: Diagnosis not present

## 2021-10-04 DIAGNOSIS — E7849 Other hyperlipidemia: Secondary | ICD-10-CM | POA: Diagnosis not present

## 2021-10-04 MED ORDER — OZEMPIC (0.25 OR 0.5 MG/DOSE) 2 MG/1.5ML ~~LOC~~ SOPN
0.5000 mg | PEN_INJECTOR | SUBCUTANEOUS | 0 refills | Status: DC
  Filled 2021-10-04: qty 1.5, 28d supply, fill #0

## 2021-10-04 NOTE — Progress Notes (Signed)
Chief Complaint:   OBESITY Robin Knapp is here to discuss her progress with her obesity treatment plan along with follow-up of her obesity related diagnoses. Robin Knapp is on the Category 2 Plan and keeping a food journal and adhering to recommended goals of 1200-1400 calories and 85+ grams protein and states she is following her eating plan approximately 70% of the time. Robin Knapp states she is walking 30-45 minutes 2 times per week.  Today's visit was #: 52 Starting weight: 199 lbs Starting date: 11/27/2017 Today's weight: 191 lbs Today's date: 10/04/2021 Total lbs lost to date: 8 Total lbs lost since last in-office visit: 0  Interim History: Pt has been eating her cravings and stress eating. She is not eating protein or calories and when she does eat, she is not making mindful choices. Pt has been very stressed with health situation with her mother-in-law. Self care has been limited. She has been trying to go to the gym more frequently.  Subjective:   1. Pre-diabetes Pt is doing well on Ozempic but is still struggling with emotional eating.  2. Other hyperlipidemia Pt is on Lipitor but her last LDL was 120. She has no myalgias or transaminitis.  Assessment/Plan:   1. Pre-diabetes Robin Knapp will continue to work on weight loss, exercise, and decreasing simple carbohydrates to help decrease the risk of diabetes. Decrease Ozempic to 0.5 mg.  Decrease & Refill- Semaglutide,0.25 or 0.5MG /DOS, (OZEMPIC, 0.25 OR 0.5 MG/DOSE,) 2 MG/1.5ML SOPN; Inject 0.5 mg into the skin once a week.  Dispense: 1.5 mL; Refill: 0  2. Other hyperlipidemia Cardiovascular risk and specific lipid/LDL goals reviewed.  We discussed several lifestyle modifications today and Robin Knapp will continue to work on diet, exercise and weight loss efforts. Orders and follow up as documented in patient record. Continue current treatment plan.  Counseling Intensive lifestyle modifications are the first line treatment for this  issue. Dietary changes: Increase soluble fiber. Decrease simple carbohydrates. Exercise changes: Moderate to vigorous-intensity aerobic activity 150 minutes per week if tolerated. Lipid-lowering medications: see documented in medical record.  3. Obesity with current BMI of 30.0  Robin Knapp is currently in the action stage of change. As such, her goal is to continue with weight loss efforts. She has agreed to the Category 2 Plan and keeping a food journal and adhering to recommended goals of 1200-1400 calories and 85+ grams protein.   Exercise goals:  As is  Behavioral modification strategies: increasing lean protein intake, meal planning and cooking strategies, keeping healthy foods in the home, dealing with family or coworker sabotage, and keeping a strict food journal.  Robin Knapp has agreed to follow-up with our clinic in 3-4 weeks. She was informed of the importance of frequent follow-up visits to maximize her success with intensive lifestyle modifications for her multiple health conditions.   Objective:   Blood pressure 127/86, pulse 94, temperature 98.4 F (36.9 C), height 5\' 7"  (1.702 m), weight 191 lb (86.6 kg), last menstrual period 09/11/2021, SpO2 99 %. Body mass index is 29.91 kg/m.  General: Cooperative, alert, well developed, in no acute distress. HEENT: Conjunctivae and lids unremarkable. Cardiovascular: Regular rhythm.  Lungs: Normal work of breathing. Neurologic: No focal deficits.   Lab Results  Component Value Date   CREATININE 0.87 08/08/2021   BUN 14 08/08/2021   NA 143 08/08/2021   K 4.7 08/08/2021   CL 105 08/08/2021   CO2 25 08/08/2021   Lab Results  Component Value Date   ALT 24 08/08/2021   AST  16 08/08/2021   ALKPHOS 39 (L) 08/08/2021   BILITOT 0.4 08/08/2021   Lab Results  Component Value Date   HGBA1C 5.5 08/08/2021   HGBA1C 5.4 02/20/2021   HGBA1C 5.8 (H) 07/19/2020   HGBA1C 5.5 04/05/2020   HGBA1C 5.4 09/24/2019   Lab Results  Component  Value Date   INSULIN 19.0 08/08/2021   INSULIN 7.8 02/20/2021   INSULIN 10.5 07/19/2020   INSULIN 12.7 04/05/2020   INSULIN 10.4 09/24/2019   Lab Results  Component Value Date   TSH 0.667 11/27/2017   Lab Results  Component Value Date   CHOL 172 08/08/2021   HDL 41 08/08/2021   LDLCALC 120 (H) 08/08/2021   TRIG 58 08/08/2021   CHOLHDL 3.7 02/20/2021   Lab Results  Component Value Date   VD25OH 56.4 08/08/2021   VD25OH 64.7 02/20/2021   VD25OH 62.9 07/19/2020   Lab Results  Component Value Date   WBC 8.0 11/27/2017   HGB 13.6 11/27/2017   HCT 38.8 11/27/2017   MCV 86 11/27/2017   PLT 343 04/06/2015    Attestation Statements:   Reviewed by clinician on day of visit: allergies, medications, problem list, medical history, surgical history, family history, social history, and previous encounter notes.  Coral Ceo, CMA, am acting as transcriptionist for Coralie Common, MD.   I have reviewed the above documentation for accuracy and completeness, and I agree with the above. - Coralie Common, MD

## 2021-10-17 ENCOUNTER — Encounter (INDEPENDENT_AMBULATORY_CARE_PROVIDER_SITE_OTHER): Payer: Self-pay | Admitting: Family Medicine

## 2021-10-18 NOTE — Telephone Encounter (Signed)
Please advise 

## 2021-10-23 NOTE — Telephone Encounter (Signed)
Need to have tried metformin, please advise

## 2021-11-01 ENCOUNTER — Ambulatory Visit (INDEPENDENT_AMBULATORY_CARE_PROVIDER_SITE_OTHER): Payer: No Typology Code available for payment source | Admitting: Family Medicine

## 2021-11-01 ENCOUNTER — Encounter (INDEPENDENT_AMBULATORY_CARE_PROVIDER_SITE_OTHER): Payer: Self-pay | Admitting: Family Medicine

## 2021-11-01 ENCOUNTER — Other Ambulatory Visit (HOSPITAL_COMMUNITY): Payer: Self-pay

## 2021-11-01 ENCOUNTER — Other Ambulatory Visit: Payer: Self-pay

## 2021-11-01 VITALS — BP 124/78 | HR 79 | Temp 98.1°F | Ht 67.0 in | Wt 191.0 lb

## 2021-11-01 DIAGNOSIS — Z6829 Body mass index (BMI) 29.0-29.9, adult: Secondary | ICD-10-CM

## 2021-11-01 DIAGNOSIS — E7849 Other hyperlipidemia: Secondary | ICD-10-CM

## 2021-11-01 DIAGNOSIS — E669 Obesity, unspecified: Secondary | ICD-10-CM

## 2021-11-01 DIAGNOSIS — R7303 Prediabetes: Secondary | ICD-10-CM | POA: Diagnosis not present

## 2021-11-01 DIAGNOSIS — Z9189 Other specified personal risk factors, not elsewhere classified: Secondary | ICD-10-CM

## 2021-11-01 MED ORDER — OZEMPIC (0.25 OR 0.5 MG/DOSE) 2 MG/1.5ML ~~LOC~~ SOPN
0.5000 mg | PEN_INJECTOR | SUBCUTANEOUS | 0 refills | Status: DC
Start: 2021-11-01 — End: 2021-11-29
  Filled 2021-11-01: qty 1.5, 28d supply, fill #0

## 2021-11-01 NOTE — Progress Notes (Signed)
Chief Complaint:   OBESITY Robin Knapp is here to discuss her progress with her obesity treatment plan along with follow-up of her obesity related diagnoses. Robin Knapp is on the Category 2 Plan and keeping a food journal and adhering to recommended goals of 1200-1400 calories and 85+ grams protein and states she is following her eating plan approximately 70% of the time. Robin Knapp states she is walking 60 minutes 1 times per week.  Today's visit was #: 44 Starting weight: 199 lbs Starting date: 11/27/2017 Today's weight: 191 lbs Today's date: 11/01/2021 Total lbs lost to date: 8 Total lbs lost since last in-office visit: 0  Interim History: Pt is trying to be more mindful 70% of the time- eyeballing protein amount. She is losing her hunger cues, so wondering about cutting back on her Ozempic. She has the same home stress still. Pt realizes she needs to increase her activity level at home.  Subjective:   1. Pre-diabetes Robin Knapp's last A1c was 5.5 with an insulin level of 19.0. She is on GLP-1.  2. Other hyperlipidemia Robin Knapp has an LDL of 120, HDL 41, and triglycerides 58. She is on Lipitor.  3. At risk for deficient intake of food The patient is at a higher than average risk of deficient intake of food due to inadequate intake.  Assessment/Plan:   1. Pre-diabetes Robin Knapp will continue to work on weight loss, exercise, and decreasing simple carbohydrates to help decrease the risk of diabetes.   Refill- Semaglutide,0.25 or 0.5MG /DOS, (OZEMPIC, 0.25 OR 0.5 MG/DOSE,) 2 MG/1.5ML SOPN; Inject 0.5 mg into the skin once a week.  Dispense: 1.5 mL; Refill: 0  2. Other hyperlipidemia Cardiovascular risk and specific lipid/LDL goals reviewed.  We discussed several lifestyle modifications today and Robin Knapp will continue to work on diet, exercise and weight loss efforts. Orders and follow up as documented in patient record. Continue Lipitor with no change in dose.  Counseling Intensive  lifestyle modifications are the first line treatment for this issue. Dietary changes: Increase soluble fiber. Decrease simple carbohydrates. Exercise changes: Moderate to vigorous-intensity aerobic activity 150 minutes per week if tolerated. Lipid-lowering medications: see documented in medical record.  3. At risk for deficient intake of food Robin Knapp was given approximately 15 minutes of deficient intake of food prevention counseling today. Robin Knapp is at risk for eating too few calories based on current food recall. She was encouraged to focus on meeting caloric and protein goals according to her recommended meal plan.  4. Obesity with current BMI of 29.9 Robin Knapp is currently in the action stage of change. As such, her goal is to continue with weight loss efforts. She has agreed to the Category 2 Plan and keeping a food journal and adhering to recommended goals of 1200-1400 calories and 85+ grams protein.   Exercise goals:  Start 10 minutes of activity 3-4 days a week.  Behavioral modification strategies: increasing lean protein intake and meal planning and cooking strategies.  Robin Knapp has agreed to follow-up with our clinic in 4 weeks. She was informed of the importance of frequent follow-up visits to maximize her success with intensive lifestyle modifications for her multiple health conditions.   Objective:   Blood pressure 124/78, pulse 79, temperature 98.1 F (36.7 C), height 5\' 7"  (1.702 m), weight 191 lb (86.6 kg), SpO2 97 %. Body mass index is 29.91 kg/m.  General: Cooperative, alert, well developed, in no acute distress. HEENT: Conjunctivae and lids unremarkable. Cardiovascular: Regular rhythm.  Lungs: Normal work of breathing. Neurologic: No  focal deficits.   Lab Results  Component Value Date   CREATININE 0.87 08/08/2021   BUN 14 08/08/2021   NA 143 08/08/2021   K 4.7 08/08/2021   CL 105 08/08/2021   CO2 25 08/08/2021   Lab Results  Component Value Date   ALT 24  08/08/2021   AST 16 08/08/2021   ALKPHOS 39 (L) 08/08/2021   BILITOT 0.4 08/08/2021   Lab Results  Component Value Date   HGBA1C 5.5 08/08/2021   HGBA1C 5.4 02/20/2021   HGBA1C 5.8 (H) 07/19/2020   HGBA1C 5.5 04/05/2020   HGBA1C 5.4 09/24/2019   Lab Results  Component Value Date   INSULIN 19.0 08/08/2021   INSULIN 7.8 02/20/2021   INSULIN 10.5 07/19/2020   INSULIN 12.7 04/05/2020   INSULIN 10.4 09/24/2019   Lab Results  Component Value Date   TSH 0.667 11/27/2017   Lab Results  Component Value Date   CHOL 172 08/08/2021   HDL 41 08/08/2021   LDLCALC 120 (H) 08/08/2021   TRIG 58 08/08/2021   CHOLHDL 3.7 02/20/2021   Lab Results  Component Value Date   VD25OH 56.4 08/08/2021   VD25OH 64.7 02/20/2021   VD25OH 62.9 07/19/2020   Lab Results  Component Value Date   WBC 8.0 11/27/2017   HGB 13.6 11/27/2017   HCT 38.8 11/27/2017   MCV 86 11/27/2017   PLT 343 04/06/2015    Attestation Statements:   Reviewed by clinician on day of visit: allergies, medications, problem list, medical history, surgical history, family history, social history, and previous encounter notes.  Coral Ceo, CMA, am acting as transcriptionist for Coralie Common, MD.   I have reviewed the above documentation for accuracy and completeness, and I agree with the above. - Coralie Common, MD

## 2021-11-03 ENCOUNTER — Other Ambulatory Visit (HOSPITAL_COMMUNITY): Payer: Self-pay

## 2021-11-29 ENCOUNTER — Other Ambulatory Visit: Payer: Self-pay

## 2021-11-29 ENCOUNTER — Other Ambulatory Visit (HOSPITAL_COMMUNITY): Payer: Self-pay

## 2021-11-29 ENCOUNTER — Encounter (INDEPENDENT_AMBULATORY_CARE_PROVIDER_SITE_OTHER): Payer: Self-pay | Admitting: Family Medicine

## 2021-11-29 ENCOUNTER — Ambulatory Visit (INDEPENDENT_AMBULATORY_CARE_PROVIDER_SITE_OTHER): Payer: No Typology Code available for payment source | Admitting: Family Medicine

## 2021-11-29 VITALS — BP 130/77 | HR 79 | Temp 97.8°F | Ht 67.0 in | Wt 189.0 lb

## 2021-11-29 DIAGNOSIS — R7303 Prediabetes: Secondary | ICD-10-CM | POA: Diagnosis not present

## 2021-11-29 DIAGNOSIS — E7849 Other hyperlipidemia: Secondary | ICD-10-CM | POA: Diagnosis not present

## 2021-11-29 DIAGNOSIS — Z6829 Body mass index (BMI) 29.0-29.9, adult: Secondary | ICD-10-CM | POA: Diagnosis not present

## 2021-11-29 DIAGNOSIS — E669 Obesity, unspecified: Secondary | ICD-10-CM

## 2021-11-29 MED ORDER — OZEMPIC (0.25 OR 0.5 MG/DOSE) 2 MG/1.5ML ~~LOC~~ SOPN
0.5000 mg | PEN_INJECTOR | SUBCUTANEOUS | 0 refills | Status: DC
  Filled 2021-11-29 – 2021-12-26 (×2): qty 1.5, 28d supply, fill #0

## 2021-12-04 NOTE — Progress Notes (Signed)
? ? ? ?Chief Complaint:  ? ?OBESITY ?Robin Knapp is here to discuss her progress with her obesity treatment plan along with follow-up of her obesity related diagnoses. Robin Knapp is on the Category 2 Plan and keeping a food journal and adhering to recommended goals of 1200-1400 calories and 85 plus grams of protein and states she is following her eating plan approximately 95% of the time. Robin Knapp states she is doing cardio for 45 minutes 3 times per week and stretch circuit for 75 minutes 3 times per week. ? ?Today's visit was #: 43 ?Starting weight: 199 lbs ?Starting date: 11/27/2017 ?Today's weight: 189 lbs ?Today's date:11/29/2021 ?Total lbs lost to date: 10 lbs ?Total lbs lost since last in-office visit: 2 lbs ? ?Interim History: Robin Knapp is working on getting refocused on meal plan. She is trying to be consistent with her activity level as well. She is focusing on increasing her water intake as well. She is doing more journaling often 1200 calories. She is going to [place] next week for sorority's gala and then going to [place] with her daughters band.  ? ?Subjective:  ? ?1. Other hyperlipidemia ?Robin Knapp is currently taking Lipitor 20 mg. She denies transaminitis or mylagias.  ? ?2. Pre-diabetes ?Robin Knapp is on Ozempic 0.5 mg weekly currently. She denies gastrointestinal or side effects.  ? ?Assessment/Plan:  ? ?1. Other hyperlipidemia ?Cardiovascular risk and specific lipid/LDL goals reviewed.  Robin Knapp will continue taking Lipitor. We discussed several lifestyle modifications today and Robin Knapp will continue to work on diet, exercise and weight loss efforts. Orders and follow up as documented in patient record.  ? ?Counseling ?Intensive lifestyle modifications are the first line treatment for this issue. ?Dietary changes: Increase soluble fiber. Decrease simple carbohydrates. ?Exercise changes: Moderate to vigorous-intensity aerobic activity 150 minutes per week if tolerated. ?Lipid-lowering medications: see documented  in medical record. ? ?2. Pre-diabetes ?We will refill Ozempic 0.5 mg weekly subcutaneous for 1 month with no refills. Robin Knapp will continue to work on weight loss, exercise, and decreasing simple carbohydrates to help decrease the risk of diabetes.  ? ?- Semaglutide,0.25 or 0.'5MG'$ /DOS, (OZEMPIC, 0.25 OR 0.5 MG/DOSE,) 2 MG/1.5ML SOPN; Inject 0.5 mg into the skin once a week.  Dispense: 1.5 mL; Refill: 0 ? ?3. Obesity with current BMI of 29.7 ?Robin Knapp is currently in the action stage of change. As such, her goal is to continue with weight loss efforts. She has agreed to keeping a food journal and adhering to recommended goals of 1200-1400 calories and 85 plus grams of protein daily.  ? ?Exercise goals: All adults should avoid inactivity. Some physical activity is better than none, and adults who participate in any amount of physical activity gain some health benefits. As is. Lavella will incorporate exercise even when away.  ? ?Behavioral modification strategies: increasing lean protein intake, meal planning and cooking strategies, keeping healthy foods in the home, and planning for success. ? ?Robin Knapp has agreed to follow-up with our clinic in 4 weeks. She was informed of the importance of frequent follow-up visits to maximize her success with intensive lifestyle modifications for her multiple health conditions.  ? ?Objective:  ? ?Blood pressure 130/77, pulse 79, temperature 97.8 ?F (36.6 ?C), height '5\' 7"'$  (1.702 m), weight 189 lb (85.7 kg), SpO2 98 %. ?Body mass index is 29.6 kg/m?. ? ?General: Cooperative, alert, well developed, in no acute distress. ?HEENT: Conjunctivae and lids unremarkable. ?Cardiovascular: Regular rhythm.  ?Lungs: Normal work of breathing. ?Neurologic: No focal deficits.  ? ?Lab Results  ?Component Value  Date  ? CREATININE 0.87 08/08/2021  ? BUN 14 08/08/2021  ? NA 143 08/08/2021  ? K 4.7 08/08/2021  ? CL 105 08/08/2021  ? CO2 25 08/08/2021  ? ?Lab Results  ?Component Value Date  ? ALT 24  08/08/2021  ? AST 16 08/08/2021  ? ALKPHOS 39 (L) 08/08/2021  ? BILITOT 0.4 08/08/2021  ? ?Lab Results  ?Component Value Date  ? HGBA1C 5.5 08/08/2021  ? HGBA1C 5.4 02/20/2021  ? HGBA1C 5.8 (H) 07/19/2020  ? HGBA1C 5.5 04/05/2020  ? HGBA1C 5.4 09/24/2019  ? ?Lab Results  ?Component Value Date  ? INSULIN 19.0 08/08/2021  ? INSULIN 7.8 02/20/2021  ? INSULIN 10.5 07/19/2020  ? INSULIN 12.7 04/05/2020  ? INSULIN 10.4 09/24/2019  ? ?Lab Results  ?Component Value Date  ? TSH 0.667 11/27/2017  ? ?Lab Results  ?Component Value Date  ? CHOL 172 08/08/2021  ? HDL 41 08/08/2021  ? [place] 120 (H) 08/08/2021  ? TRIG 58 08/08/2021  ? CHOLHDL 3.7 02/20/2021  ? ?Lab Results  ?Component Value Date  ? VD25OH 56.4 08/08/2021  ? VD25OH 64.7 02/20/2021  ? VD25OH 62.9 07/19/2020  ? ?Lab Results  ?Component Value Date  ? WBC 8.0 11/27/2017  ? HGB 13.6 11/27/2017  ? HCT 38.8 11/27/2017  ? MCV 86 11/27/2017  ? PLT 343 04/06/2015  ? ?No results found for: IRON, TIBC, FERRITIN ? ?Attestation Statements:  ? ?Reviewed by clinician on day of visit: allergies, medications, problem list, medical history, surgical history, family history, social history, and previous encounter notes. ? ?I, Lizbeth Bark, RMA, am acting as transcriptionist for Coralie Common, MD.  ? ?I have reviewed the above documentation for accuracy and completeness, and I agree with the above. Coralie Common, MD ? ?

## 2021-12-07 ENCOUNTER — Other Ambulatory Visit (HOSPITAL_COMMUNITY): Payer: Self-pay

## 2021-12-25 ENCOUNTER — Ambulatory Visit (INDEPENDENT_AMBULATORY_CARE_PROVIDER_SITE_OTHER): Payer: No Typology Code available for payment source | Admitting: Family Medicine

## 2021-12-25 ENCOUNTER — Encounter (INDEPENDENT_AMBULATORY_CARE_PROVIDER_SITE_OTHER): Payer: Self-pay

## 2021-12-26 ENCOUNTER — Other Ambulatory Visit (HOSPITAL_COMMUNITY): Payer: Self-pay

## 2022-01-02 ENCOUNTER — Other Ambulatory Visit (HOSPITAL_COMMUNITY): Payer: Self-pay

## 2022-01-02 MED ORDER — ATORVASTATIN CALCIUM 10 MG PO TABS
10.0000 mg | ORAL_TABLET | Freq: Every day | ORAL | 3 refills | Status: DC
Start: 2022-01-02 — End: 2022-01-04
  Filled 2022-01-02: qty 90, 90d supply, fill #0

## 2022-01-03 ENCOUNTER — Other Ambulatory Visit (HOSPITAL_COMMUNITY): Payer: Self-pay

## 2022-01-03 ENCOUNTER — Encounter (INDEPENDENT_AMBULATORY_CARE_PROVIDER_SITE_OTHER): Payer: Self-pay | Admitting: Family Medicine

## 2022-01-03 ENCOUNTER — Ambulatory Visit (INDEPENDENT_AMBULATORY_CARE_PROVIDER_SITE_OTHER): Payer: No Typology Code available for payment source | Admitting: Family Medicine

## 2022-01-03 VITALS — BP 117/77 | HR 78 | Temp 98.1°F | Ht 67.0 in | Wt 180.0 lb

## 2022-01-03 DIAGNOSIS — Z9189 Other specified personal risk factors, not elsewhere classified: Secondary | ICD-10-CM

## 2022-01-03 DIAGNOSIS — E669 Obesity, unspecified: Secondary | ICD-10-CM

## 2022-01-03 DIAGNOSIS — E7849 Other hyperlipidemia: Secondary | ICD-10-CM

## 2022-01-03 DIAGNOSIS — Z6833 Body mass index (BMI) 33.0-33.9, adult: Secondary | ICD-10-CM

## 2022-01-03 DIAGNOSIS — R7303 Prediabetes: Secondary | ICD-10-CM

## 2022-01-03 DIAGNOSIS — Z6828 Body mass index (BMI) 28.0-28.9, adult: Secondary | ICD-10-CM

## 2022-01-03 MED ORDER — OZEMPIC (0.25 OR 0.5 MG/DOSE) 2 MG/1.5ML ~~LOC~~ SOPN
0.5000 mg | PEN_INJECTOR | SUBCUTANEOUS | 0 refills | Status: DC
Start: 2022-01-03 — End: 2022-01-31
  Filled 2022-01-03: qty 1.5, 28d supply, fill #0

## 2022-01-04 ENCOUNTER — Other Ambulatory Visit (HOSPITAL_COMMUNITY): Payer: Self-pay

## 2022-01-04 MED ORDER — ATORVASTATIN CALCIUM 20 MG PO TABS
20.0000 mg | ORAL_TABLET | Freq: Every day | ORAL | 3 refills | Status: DC
  Filled 2022-01-04 – 2022-04-11 (×2): qty 90, 90d supply, fill #0

## 2022-01-14 NOTE — Progress Notes (Signed)
Chief Complaint:   OBESITY Robin Knapp is here to discuss her progress with her obesity treatment plan along with follow-up of her obesity related diagnoses. [name] is on keeping a food journal and adhering to recommended goals of 1200 to 1400 calories and 85 grams of protein and states she is following her eating plan approximately 50% of the time. [name] states she is walking 19,000 steps 5 times per week.  Today's visit was #: 74 Starting weight: 199 lbs Starting date: 11/27/2017 Today's weight: 180 lbs Today's date: 01/03/2022 Total lbs lost to date: 19 lbs Total lbs lost since last in-office visit: 9  Interim History: Robin Knapp has been getting in more protein and veggies over the last few weeks. She has not been able to do as much exercise as she was previously. Robin Knapp is sticking much more consistently to the meal plan. She is measuring and weighing most of the time.  Subjective:   1. Pre-diabetes Robin Knapp is on Ozempic 0.'5mg'$  and feels good on this dose.  2. Other hyperlipidemia Robin Knapp is on atorvastatin '10mg'$  daily, though she reports really being on '20mg'$ . She denies myalgias or transaminitis.  3. At risk for activity intolerance Robin Knapp is at risk of exercise intolerance as she has not been able to exercise as much.  Assessment/Plan:   1. Pre-diabetes [name] agrees to continue taking Ozempic 0.'5mg'$  and will follow up as directed.  - Semaglutide,0.25 or 0.'5MG'$ /DOS, (OZEMPIC, 0.25 OR 0.5 MG/DOSE,) 2 MG/1.5ML SOPN; Inject 0.5 mg into the skin once a week.  Dispense: 1.5 mL; Refill: 0  2. Other hyperlipidemia [name] agrees to follow up on her FLP results at Dr Jodi Mourning office.  3. At risk for activity intolerance Robin Knapp was given approximately 15 minutes of exercise intolerance counseling today. She is 51 y.o. female and has risk factors exercise intolerance including obesity. We discussed intensive lifestyle modifications today with an emphasis on specific weight loss  instructions and strategies. Robin Knapp will slowly increase activity as tolerated.  Repetitive spaced learning was employed today to elicit superior memory formation and behavioral change.  4. Obesity with current BMI of 28.3 Robin Knapp is currently in the action stage of change. As such, her goal is to continue with weight loss efforts. She has agreed to keeping a food journal and adhering to recommended goals of 1200 to 1400 calories and 85+ grams of protein daily.  Exercise goals: All adults should avoid inactivity. Some physical activity is better than none, and adults who participate in any amount of physical activity gain some health benefits.  Behavioral modification strategies: increasing lean protein intake, meal planning and cooking strategies, keeping healthy foods in the home, and planning for success.  Robin Knapp has agreed to follow-up with our clinic in 4 weeks. She was informed of the importance of frequent follow-up visits to maximize her success with intensive lifestyle modifications for her multiple health conditions.   Objective:   Blood pressure 117/77, pulse 78, temperature 98.1 F (36.7 C), height '5\' 7"'$  (1.702 m), weight 180 lb (81.6 kg), SpO2 100 %. Body mass index is 28.19 kg/m.  General: Cooperative, alert, well developed, in no acute distress. HEENT: Conjunctivae and lids unremarkable. Cardiovascular: Regular rhythm.  Lungs: Normal work of breathing. Neurologic: No focal deficits.   Lab Results  Component Value Date   CREATININE 0.87 08/08/2021   BUN 14 08/08/2021   NA 143 08/08/2021   K 4.7 08/08/2021   CL 105 08/08/2021   CO2 25 08/08/2021   Lab Results  Component Value Date   ALT 24 08/08/2021   AST 16 08/08/2021   ALKPHOS 39 (L) 08/08/2021   BILITOT 0.4 08/08/2021   Lab Results  Component Value Date   HGBA1C 5.5 08/08/2021   HGBA1C 5.4 02/20/2021   HGBA1C 5.8 (H) 07/19/2020   HGBA1C 5.5 04/05/2020   HGBA1C 5.4 09/24/2019   Lab Results   Component Value Date   INSULIN 19.0 08/08/2021   INSULIN 7.8 02/20/2021   INSULIN 10.5 07/19/2020   INSULIN 12.7 04/05/2020   INSULIN 10.4 09/24/2019   Lab Results  Component Value Date   TSH 0.667 11/27/2017   Lab Results  Component Value Date   CHOL 172 08/08/2021   HDL 41 08/08/2021   LDLCALC 120 (H) 08/08/2021   TRIG 58 08/08/2021   CHOLHDL 3.7 02/20/2021   Lab Results  Component Value Date   VD25OH 56.4 08/08/2021   VD25OH 64.7 02/20/2021   VD25OH 62.9 07/19/2020   Lab Results  Component Value Date   WBC 8.0 11/27/2017   HGB 13.6 11/27/2017   HCT 38.8 11/27/2017   MCV 86 11/27/2017   PLT 343 04/06/2015   No results found for: IRON, TIBC, FERRITIN  Attestation Statements:   Reviewed by clinician on day of visit: allergies, medications, problem list, medical history, surgical history, family history, social history, and previous encounter notes.  IMarcille Blanco, CMA, am acting as transcriptionist for Coralie Common, MD  I have reviewed the above documentation for accuracy and completeness, and I agree with the above. - Coralie Common, MD

## 2022-01-31 ENCOUNTER — Encounter (INDEPENDENT_AMBULATORY_CARE_PROVIDER_SITE_OTHER): Payer: Self-pay | Admitting: Family Medicine

## 2022-01-31 ENCOUNTER — Ambulatory Visit (INDEPENDENT_AMBULATORY_CARE_PROVIDER_SITE_OTHER): Payer: No Typology Code available for payment source | Admitting: Family Medicine

## 2022-01-31 ENCOUNTER — Other Ambulatory Visit (HOSPITAL_COMMUNITY): Payer: Self-pay

## 2022-01-31 VITALS — BP 106/68 | HR 76 | Temp 97.7°F | Ht 67.0 in | Wt 174.0 lb

## 2022-01-31 DIAGNOSIS — R7303 Prediabetes: Secondary | ICD-10-CM

## 2022-01-31 DIAGNOSIS — Z6827 Body mass index (BMI) 27.0-27.9, adult: Secondary | ICD-10-CM

## 2022-01-31 DIAGNOSIS — E7849 Other hyperlipidemia: Secondary | ICD-10-CM

## 2022-01-31 DIAGNOSIS — E669 Obesity, unspecified: Secondary | ICD-10-CM | POA: Diagnosis not present

## 2022-01-31 DIAGNOSIS — Z9189 Other specified personal risk factors, not elsewhere classified: Secondary | ICD-10-CM

## 2022-01-31 MED ORDER — SEMAGLUTIDE(0.25 OR 0.5MG/DOS) 2 MG/3ML ~~LOC~~ SOPN
0.5000 mg | PEN_INJECTOR | SUBCUTANEOUS | 0 refills | Status: DC
  Filled 2022-01-31: qty 3, 28d supply, fill #0

## 2022-02-05 NOTE — Progress Notes (Signed)
Chief Complaint:   OBESITY Robin Knapp is here to discuss her progress with her obesity treatment plan along with follow-up of her obesity related diagnoses. [name] is on keeping a food journal and adhering to recommended goals of 1200-1400 calories and 85+ protein and states she is following her eating plan approximately 95% of the time. [name] states she is doing cardio and mixed aerobics 45 minutes 2-3 times per week.  Today's visit was #: 69 Starting weight: 199 lbs Starting date: 11/27/2017 Today's weight: 174 lbs Today's date: 02/05/2022 Total lbs lost to date: 25 lbs Total lbs lost since last in-office visit: 6 lbs  Interim History: [name] has been very mindful of food intake over the last few weeks.  She has also been working more activity in.  She has been trying to make more healthier options for herself.  Does think she is repeating journaling and protein intake will be repeatable in the new few weeks.   Subjective:   1. Pre-diabetes Robin Knapp is on Ozempic 0.5 mg SQ weekly.  Her last A1c was 5.5, Insulin level 19.0.  2. Other hyperlipidemia [name] is on Lipitor 20 mg.  Her Last LDL 120, HDL 41, Triglycerides 58 (recent labs 84 LDL, 37 HDL, 68 Triglycerides).  [name] denies myalgias or transaminitis.   3. At risk for heart disease Robin Knapp is at higher than average risk for cardiovascular disease due to obesity.   Assessment/Plan:   1. Pre-diabetes Robin Knapp has agreed to continue Ozempic.  Refilled today, see below.  - Semaglutide,0.25 or 0.'5MG'$ /DOS, 2 MG/3ML SOPN; Inject 0.5 mg into the skin once a week.  Dispense: 3 mL; Refill: 0  2. Other hyperlipidemia [name] has agreed to continue Lipitor no change in dose.  3. At risk for heart disease Robin Knapp was given approximately 15 minutes of coronary artery disease prevention counseling today. She is 51 y.o. female and has risk factors for heart disease including obesity. We discussed intensive lifestyle modifications  today with an emphasis on specific weight loss instructions and strategies.  Repetitive spaced learning was employed today to elicit superior memory formation and behavioral change.    4. Obesity with current BMI of 27.3 Robin Knapp is currently in the action stage of change. As such, her goal is to continue with weight loss efforts. She has agreed to keeping a food journal and adhering to recommended goals of 1200-1400 calories and 85+ protein daily.  Exercise goals: All adults should avoid inactivity. Some physical activity is better than none, and adults who participate in any amount of physical activity gain some health benefits.  Behavioral modification strategies: increasing lean protein intake, meal planning and cooking strategies, and keeping healthy foods in the home.  Robin Knapp has agreed to follow-up with our clinic in 5 weeks. She was informed of the importance of frequent follow-up visits to maximize her success with intensive lifestyle modifications for her multiple health conditions.   Objective:   Blood pressure 106/68, pulse 76, temperature 97.7 F (36.5 C), height '5\' 7"'$  (1.702 m), weight 174 lb (78.9 kg), SpO2 100 %. Body mass index is 27.25 kg/m.  General: Cooperative, alert, well developed, in no acute distress. HEENT: Conjunctivae and lids unremarkable. Cardiovascular: Regular rhythm.  Lungs: Normal work of breathing. Neurologic: No focal deficits.   Lab Results  Component Value Date   CREATININE 0.87 08/08/2021   BUN 14 08/08/2021   NA 143 08/08/2021   K 4.7 08/08/2021   CL 105 08/08/2021   CO2 25 08/08/2021  Lab Results  Component Value Date   ALT 24 08/08/2021   AST 16 08/08/2021   ALKPHOS 39 (L) 08/08/2021   BILITOT 0.4 08/08/2021   Lab Results  Component Value Date   HGBA1C 5.5 08/08/2021   HGBA1C 5.4 02/20/2021   HGBA1C 5.8 (H) 07/19/2020   HGBA1C 5.5 04/05/2020   HGBA1C 5.4 09/24/2019   Lab Results  Component Value Date   INSULIN 19.0  08/08/2021   INSULIN 7.8 02/20/2021   INSULIN 10.5 07/19/2020   INSULIN 12.7 04/05/2020   INSULIN 10.4 09/24/2019   Lab Results  Component Value Date   TSH 0.667 11/27/2017   Lab Results  Component Value Date   CHOL 172 08/08/2021   HDL 41 08/08/2021   LDLCALC 120 (H) 08/08/2021   TRIG 58 08/08/2021   CHOLHDL 3.7 02/20/2021   Lab Results  Component Value Date   VD25OH 56.4 08/08/2021   VD25OH 64.7 02/20/2021   VD25OH 62.9 07/19/2020   Lab Results  Component Value Date   WBC 8.0 11/27/2017   HGB 13.6 11/27/2017   HCT 38.8 11/27/2017   MCV 86 11/27/2017   PLT 343 04/06/2015   No results found for: IRON, TIBC, FERRITIN   Attestation Statements:   Reviewed by clinician on day of visit: allergies, medications, problem list, medical history, surgical history, family history, social history, and previous encounter notes.  I, Davy Pique, RMA, am acting as Location manager for Derry Skill.  I have reviewed the above documentation for accuracy and completeness, and I agree with the above. - Coralie Common, MD

## 2022-03-05 ENCOUNTER — Ambulatory Visit (INDEPENDENT_AMBULATORY_CARE_PROVIDER_SITE_OTHER): Payer: No Typology Code available for payment source | Admitting: Family Medicine

## 2022-03-05 ENCOUNTER — Other Ambulatory Visit (HOSPITAL_COMMUNITY): Payer: Self-pay

## 2022-03-05 ENCOUNTER — Encounter (INDEPENDENT_AMBULATORY_CARE_PROVIDER_SITE_OTHER): Payer: Self-pay | Admitting: Family Medicine

## 2022-03-05 VITALS — BP 124/77 | HR 79 | Temp 97.4°F | Ht 67.0 in | Wt 172.0 lb

## 2022-03-05 DIAGNOSIS — E669 Obesity, unspecified: Secondary | ICD-10-CM | POA: Diagnosis not present

## 2022-03-05 DIAGNOSIS — E7849 Other hyperlipidemia: Secondary | ICD-10-CM | POA: Diagnosis not present

## 2022-03-05 DIAGNOSIS — Z6826 Body mass index (BMI) 26.0-26.9, adult: Secondary | ICD-10-CM | POA: Diagnosis not present

## 2022-03-05 DIAGNOSIS — R7303 Prediabetes: Secondary | ICD-10-CM

## 2022-03-05 MED ORDER — SEMAGLUTIDE(0.25 OR 0.5MG/DOS) 2 MG/3ML ~~LOC~~ SOPN
0.5000 mg | PEN_INJECTOR | SUBCUTANEOUS | 0 refills | Status: DC
  Filled 2022-03-05: qty 9, 84d supply, fill #0

## 2022-03-06 NOTE — Progress Notes (Unsigned)
Chief Complaint:   OBESITY Robin Knapp is here to discuss her progress with her obesity treatment plan along with follow-up of her obesity related diagnoses. [name] is on the Category 2 Plan and keeping a food journal and adhering to recommended goals of 1200-1400 calories and 85 grams of protein and states she is following her eating plan approximately 95% of the time. [name] states she is doing cardio and weights 45-60 minutes 3-4 times per week.  Today's visit was #: 55 Starting weight: 199 lbs Starting date: 11/27/2017 Today's weight: 172 lbs Today's date: 03/05/2022 Total lbs lost to date: 27 lbs Total lbs lost since last in-office visit: 2  Interim History: [name] is trying to stay focus in terms of nutrition and physical activity. She still enjoys indulgent meals every so often, normally around once a week. Going to [facility] in July with her mom who is a Cytogeneticist. Ending close to about 1200 calories daily and protein she is at 80-85 grams a day. She is doing 45 minutes of circuit 3x a week. Then doing legs every Tuesday.  Subjective:   1. Other hyperlipidemia [name]'s last LDL at 120, HDL at 41 and Trigly at 58. She is taking Lipitor, with no myalgias or transaminitis.  2. Pre-diabetes [name] is doing well on Ozempic. Her last A1c was 5.5, insulin was 19.0.  Assessment/Plan:   1. Other hyperlipidemia Elyse will continue on Lipitor.  2. Pre-diabetes We will refill Ozempic 0.5 mg subcutaneous once weekly for 1 month with 0 refills.  -Refill Semaglutide,0.25 or 0.'5MG'$ /DOS, 2 MG/3ML SOPN; Inject 0.5 mg into the skin once a week.  Dispense: 9 mL; Refill: 0  3. Obesity with current BMI of 26.9 [name] is currently in the action stage of change. As such, her goal is to continue with weight loss efforts. She has agreed to keeping a food journal and adhering to recommended goals of 1200-1400 calories and 85+ grams of protein.   Exercise goals: As is.  Behavioral  modification strategies: increasing lean protein intake, meal planning and cooking strategies, keeping healthy foods in the home, and keeping a strict food journal.  Dominigue has agreed to follow-up with our clinic in 4 weeks. She was informed of the importance of frequent follow-up visits to maximize her success with intensive lifestyle modifications for her multiple health conditions.   Objective:   Blood pressure 124/77, pulse 79, temperature (!) 97.4 F (36.3 C), height '5\' 7"'$  (1.702 m), weight 172 lb (78 kg), SpO2 99 %. Body mass index is 26.94 kg/m.  General: Cooperative, alert, well developed, in no acute distress. HEENT: Conjunctivae and lids unremarkable. Cardiovascular: Regular rhythm.  Lungs: Normal work of breathing. Neurologic: No focal deficits.   Lab Results  Component Value Date   CREATININE 0.87 08/08/2021   BUN 14 08/08/2021   NA 143 08/08/2021   K 4.7 08/08/2021   CL 105 08/08/2021   CO2 25 08/08/2021   Lab Results  Component Value Date   ALT 24 08/08/2021   AST 16 08/08/2021   ALKPHOS 39 (L) 08/08/2021   BILITOT 0.4 08/08/2021   Lab Results  Component Value Date   HGBA1C 5.5 08/08/2021   HGBA1C 5.4 02/20/2021   HGBA1C 5.8 (H) 07/19/2020   HGBA1C 5.5 04/05/2020   HGBA1C 5.4 09/24/2019   Lab Results  Component Value Date   INSULIN 19.0 08/08/2021   INSULIN 7.8 02/20/2021   INSULIN 10.5 07/19/2020   INSULIN 12.7 04/05/2020   INSULIN 10.4 09/24/2019  Lab Results  Component Value Date   TSH 0.667 11/27/2017   Lab Results  Component Value Date   CHOL 172 08/08/2021   HDL 41 08/08/2021   LDLCALC 120 (H) 08/08/2021   TRIG 58 08/08/2021   CHOLHDL 3.7 02/20/2021   Lab Results  Component Value Date   VD25OH 56.4 08/08/2021   VD25OH 64.7 02/20/2021   VD25OH 62.9 07/19/2020   Lab Results  Component Value Date   WBC 8.0 11/27/2017   HGB 13.6 11/27/2017   HCT 38.8 11/27/2017   MCV 86 11/27/2017   PLT 343 04/06/2015   No results found for:  "IRON", "TIBC", "FERRITIN"  Attestation Statements:   Reviewed by clinician on day of visit: allergies, medications, problem list, medical history, surgical history, family history, social history, and previous encounter notes.  I, Elnora Morrison, RMA am acting as transcriptionist for Coralie Common, MD.  I have reviewed the above documentation for accuracy and completeness, and I agree with the above. -  ***

## 2022-04-09 ENCOUNTER — Ambulatory Visit (INDEPENDENT_AMBULATORY_CARE_PROVIDER_SITE_OTHER): Payer: No Typology Code available for payment source | Admitting: Family Medicine

## 2022-04-09 ENCOUNTER — Encounter (INDEPENDENT_AMBULATORY_CARE_PROVIDER_SITE_OTHER): Payer: Self-pay | Admitting: Family Medicine

## 2022-04-09 VITALS — BP 114/78 | HR 93 | Temp 98.5°F | Ht 67.0 in | Wt 171.0 lb

## 2022-04-09 DIAGNOSIS — R7303 Prediabetes: Secondary | ICD-10-CM

## 2022-04-09 DIAGNOSIS — E7849 Other hyperlipidemia: Secondary | ICD-10-CM | POA: Diagnosis not present

## 2022-04-09 DIAGNOSIS — Z6826 Body mass index (BMI) 26.0-26.9, adult: Secondary | ICD-10-CM | POA: Diagnosis not present

## 2022-04-09 DIAGNOSIS — E669 Obesity, unspecified: Secondary | ICD-10-CM | POA: Diagnosis not present

## 2022-04-11 ENCOUNTER — Other Ambulatory Visit (HOSPITAL_COMMUNITY): Payer: Self-pay

## 2022-04-11 NOTE — Progress Notes (Signed)
Chief Complaint:   OBESITY Robin Knapp is here to discuss her progress with her obesity treatment plan along with follow-up of her obesity related diagnoses. Robin Knapp is on keeping a food journal and adhering to recommended goals of 1200-1400 calories and 85+ grams of protein and states she is following her eating plan approximately 90% of the time. Robin Knapp states she is doing cardio/HIT 45/30 minutes 2/3 times per week.  Today's visit was #: 80 Starting weight: 199 lbs  Starting date: 11/27/2017 Today's weight: 171 lbs Today's date: 04/09/2022 Total lbs lost to date: 28 lbs Total lbs lost since last in-office visit: 1  Interim History: Robin Knapp has been trying to stay on plan as closely as she can. She has tried a few different recipes to make a few different sauces.  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 She just returned from a trip to [facility] last week.  Subjective:   1. Other hyperlipidemia Robin Knapp's LDL at 120, HDL at 41, and Trigly at 58. She is currently on Lipitor 20 mg.  2. Prediabetes Robin Knapp is currently on Ozempic. Her last A1c was 5.5., insulin was 19.0.  Assessment/Plan:   1. Other hyperlipidemia Will have labs obtained at next appointment with PCP.  2. Prediabetes Robin Knapp will continue 0.5 mg of Ozempic, no refills needed today.  3. Obesity with current BMI of 26.8 Robin Knapp is currently in the action stage of change. As such, her goal is to continue with weight loss efforts. She has agreed to keeping a food journal and adhering to recommended goals of 1200-1400 calories and 85+ grams of protein.   Exercise goals: As is.   Robin Knapp is to really refocus on getting more activity in consistently.  Behavioral modification strategies: increasing lean protein intake, meal planning and cooking strategies, keeping healthy foods in the home, and planning for success.  Robin Knapp has agreed to follow-up with our  clinic in 5 weeks. She was informed of the importance of frequent follow-up visits to maximize her success with intensive lifestyle modifications for her multiple health conditions.   Objective:   Blood pressure 114/78, pulse 93, temperature 98.5 F (36.9 C), height '5\' 7"'$  (1.702 m), weight 171 lb (77.6 kg), SpO2 100 %. Body mass index is 26.78 kg/m.  General: Cooperative, alert, well developed, in no acute distress. HEENT: Conjunctivae and lids unremarkable. Cardiovascular: Regular rhythm.  Lungs: Normal work of breathing. Neurologic: No focal deficits.   Lab Results  Component Value Date   CREATININE 0.87 08/08/2021   BUN 14 08/08/2021   NA 143 08/08/2021   K 4.7 08/08/2021   CL 105 08/08/2021   CO2 25 08/08/2021   Lab Results  Component Value Date   ALT 24 08/08/2021   AST 16 08/08/2021   ALKPHOS 39 (L) 08/08/2021   BILITOT 0.4 08/08/2021   Lab Results  Component Value Date   HGBA1C 5.5 08/08/2021   HGBA1C 5.4 02/20/2021   HGBA1C 5.8 (H) 07/19/2020   HGBA1C 5.5 04/05/2020   HGBA1C 5.4 09/24/2019   Lab Results  Component Value Date   INSULIN 19.0 08/08/2021   INSULIN 7.8 02/20/2021   INSULIN 10.5 07/19/2020   INSULIN 12.7 04/05/2020   INSULIN 10.4 09/24/2019   Lab Results  Component Value Date   TSH 0.667 11/27/2017   Lab Results  Component Value Date   CHOL 172 08/08/2021   HDL 41 08/08/2021   LDLCALC 120 (H) 08/08/2021  TRIG 58 08/08/2021   CHOLHDL 3.7 02/20/2021   Lab Results  Component Value Date   VD25OH 56.4 08/08/2021   VD25OH 64.7 02/20/2021   VD25OH 62.9 07/19/2020   Lab Results  Component Value Date   WBC 8.0 11/27/2017   HGB 13.6 11/27/2017   HCT 38.8 11/27/2017   MCV 86 11/27/2017   PLT 343 04/06/2015   No results found for: "IRON", "TIBC", "FERRITIN"  Attestation Statements:   Reviewed by clinician on day of visit: allergies, medications, problem list, medical history, surgical history, family history, social history, and  previous encounter notes.  I, Elnora Morrison, RMA am acting as transcriptionist for Coralie Common, MD. I have reviewed the above documentation for accuracy and completeness, and I agree with the above. - Coralie Common, MD

## 2022-04-25 ENCOUNTER — Encounter (INDEPENDENT_AMBULATORY_CARE_PROVIDER_SITE_OTHER): Payer: Self-pay

## 2022-05-06 ENCOUNTER — Telehealth: Payer: No Typology Code available for payment source | Admitting: Family

## 2022-05-06 DIAGNOSIS — R399 Unspecified symptoms and signs involving the genitourinary system: Secondary | ICD-10-CM

## 2022-05-06 MED ORDER — CEPHALEXIN 500 MG PO CAPS: 500.0000 mg | ORAL_CAPSULE | Freq: Two times a day (BID) | ORAL | 0 refills | Status: DC

## 2022-05-06 NOTE — Progress Notes (Signed)

## 2022-05-14 ENCOUNTER — Encounter (INDEPENDENT_AMBULATORY_CARE_PROVIDER_SITE_OTHER): Payer: Self-pay | Admitting: Family Medicine

## 2022-05-14 ENCOUNTER — Other Ambulatory Visit (HOSPITAL_COMMUNITY): Payer: Self-pay

## 2022-05-14 ENCOUNTER — Ambulatory Visit (INDEPENDENT_AMBULATORY_CARE_PROVIDER_SITE_OTHER): Payer: No Typology Code available for payment source | Admitting: Family Medicine

## 2022-05-14 VITALS — BP 122/75 | HR 78 | Temp 98.8°F | Ht 67.0 in | Wt 167.0 lb

## 2022-05-14 DIAGNOSIS — E559 Vitamin D deficiency, unspecified: Secondary | ICD-10-CM | POA: Diagnosis not present

## 2022-05-14 DIAGNOSIS — Z6826 Body mass index (BMI) 26.0-26.9, adult: Secondary | ICD-10-CM

## 2022-05-14 DIAGNOSIS — R7303 Prediabetes: Secondary | ICD-10-CM

## 2022-05-14 DIAGNOSIS — E669 Obesity, unspecified: Secondary | ICD-10-CM | POA: Diagnosis not present

## 2022-05-14 DIAGNOSIS — E7849 Other hyperlipidemia: Secondary | ICD-10-CM | POA: Diagnosis not present

## 2022-05-14 MED ORDER — SEMAGLUTIDE(0.25 OR 0.5MG/DOS) 2 MG/3ML ~~LOC~~ SOPN
0.5000 mg | PEN_INJECTOR | SUBCUTANEOUS | 0 refills | Status: DC
  Filled 2022-05-14: qty 9, 84d supply, fill #0

## 2022-05-15 LAB — COMPREHENSIVE METABOLIC PANEL
ALT: 21 IU/L (ref 0–32)
AST: 18 IU/L (ref 0–40)
Albumin/Globulin Ratio: 2 (ref 1.2–2.2)
Albumin: 4.5 g/dL (ref 3.9–4.9)
Alkaline Phosphatase: 32 IU/L — ABNORMAL LOW (ref 44–121)
BUN/Creatinine Ratio: 17 (ref 9–23)
BUN: 15 mg/dL (ref 6–24)
Bilirubin Total: 0.5 mg/dL (ref 0.0–1.2)
CO2: 23 mmol/L (ref 20–29)
Calcium: 9.2 mg/dL (ref 8.7–10.2)
Chloride: 99 mmol/L (ref 96–106)
Creatinine, Ser: 0.86 mg/dL (ref 0.57–1.00)
Globulin, Total: 2.3 g/dL (ref 1.5–4.5)
Glucose: 74 mg/dL (ref 70–99)
Potassium: 4.1 mmol/L (ref 3.5–5.2)
Sodium: 139 mmol/L (ref 134–144)
Total Protein: 6.8 g/dL (ref 6.0–8.5)
eGFR: 83 mL/min/{1.73_m2} (ref >59)

## 2022-05-15 LAB — INSULIN, RANDOM: INSULIN: 7.9 u[IU]/mL (ref 2.6–24.9)

## 2022-05-15 LAB — VITAMIN D 25 HYDROXY (VIT D DEFICIENCY, FRACTURES): Vit D, 25-Hydroxy: 50.8 ng/mL (ref 30.0–100.0)

## 2022-05-15 LAB — LIPID PANEL WITH LDL/HDL RATIO
Cholesterol, Total: 129 mg/dL (ref 100–199)
HDL: 47 mg/dL (ref >39)
LDL Chol Calc (NIH): 71 mg/dL (ref 0–99)
LDL/HDL Ratio: 1.5 ratio (ref 0.0–3.2)
Triglycerides: 50 mg/dL (ref 0–149)
VLDL Cholesterol Cal: 11 mg/dL (ref 5–40)

## 2022-05-15 LAB — HEMOGLOBIN A1C
Est. average glucose Bld gHb Est-mCnc: 108 mg/dL
Hgb A1c MFr Bld: 5.4 % (ref 4.8–5.6)

## 2022-05-19 NOTE — Progress Notes (Signed)
Chief Complaint:   OBESITY Robin Knapp is here to discuss her progress with her obesity treatment plan along with follow-up of her obesity related diagnoses. Robin Knapp is on the Category 2 Plan and states she is following her eating plan approximately 95% of the time. Alba states she is walking 25 minutes for 2 times per week and exercising 35 to 45 minutes 5 to 6 times per week.  Today's visit was #: 21 Starting weight: 199 lbs Starting date: 11/27/2017 Today's weight: 167 lbs Today's date: 05/14/2022 Total lbs lost to date: 32 Total lbs lost since last in-office visit: 4  Interim History: Robin Knapp has been busy with work over the last month. She voices that she has been trying to consistently journal. Robin Knapp had a UTI since the last appointment and is questioning whether or not she has a yeast infection. She is doing mostly protein and vegetables with 1 indulgent meal weekly. Robin Knapp is sticking to repetitive food choices.  Subjective:   1. Pre-diabetes Jaylon's last A1c was 5.5 and her insulin was 19.0. She is on Ozempic 0.'5mg'$  SubQ weekly.  2. Other hyperlipidemia Parul is on Lipitor. Her last LDL was 120, HDL was 41, and triglycerides were 58. She denies transaminitis or myalgias.  3. Vitamin D deficiency Robin Knapp is on vitamin D OTC 5,000IU per day. She notes fatigue.  Assessment/Plan:   1. Pre-diabetes San agrees to continue taking Ozempic. She will continue to work on weight loss, exercise, and decreasing simple carbohydrates to help decrease the risk of diabetes.   - Semaglutide,0.25 or 0.'5MG'$ /DOS, 2 MG/3ML SOPN; Inject 0.5 mg into the skin once a week.  Dispense: 9 mL; Refill: 0 - Insulin, random - Hemoglobin A1c  2. Other hyperlipidemia Labs were obtained today and Leandria will follow up as directed.  - Comprehensive metabolic panel - Lipid Panel With LDL/HDL Ratio  3. Vitamin D deficiency A vitamin D level was obtained today.   - VITAMIN D 25 Hydroxy  (Vit-D Deficiency, Fractures)  4. Obesity with current BMI of 26.2 Robin Knapp is currently in the action stage of change. As such, her goal is to continue with weight loss efforts. She has agreed to keeping a food journal and adhering to recommended goals of 1200 to 1400 calories and 85+ grams of protein daily.   Exercise goals:  As is.  Behavioral modification strategies: increasing lean protein intake, meal planning and cooking strategies, and keeping healthy foods in the home.  Robin Knapp has agreed to follow-up with our clinic in 6 weeks. She was informed of the importance of frequent follow-up visits to maximize her success with intensive lifestyle modifications for her multiple health conditions.   Robin Knapp was informed we would discuss her lab results at her next visit unless there is a critical issue that needs to be addressed sooner. Robin Knapp agreed to keep her next visit at the agreed upon time to discuss these results.  Objective:   Blood pressure 122/75, pulse 78, temperature 98.8 F (37.1 C), height '5\' 7"'$  (1.702 m), weight 167 lb (75.8 kg), SpO2 100 %. Body mass index is 26.16 kg/m.  General: Cooperative, alert, well developed, in no acute distress. HEENT: Conjunctivae and lids unremarkable. Cardiovascular: Regular rhythm.  Lungs: Normal work of breathing. Neurologic: No focal deficits.   Lab Results  Component Value Date   CREATININE 0.86 05/14/2022   BUN 15 05/14/2022   NA 139 05/14/2022   K 4.1 05/14/2022   CL 99 05/14/2022   CO2 23 05/14/2022  Lab Results  Component Value Date   ALT 21 05/14/2022   AST 18 05/14/2022   ALKPHOS 32 (L) 05/14/2022   BILITOT 0.5 05/14/2022   Lab Results  Component Value Date   HGBA1C 5.4 05/14/2022   HGBA1C 5.5 08/08/2021   HGBA1C 5.4 02/20/2021   HGBA1C 5.8 (H) 07/19/2020   HGBA1C 5.5 04/05/2020   Lab Results  Component Value Date   INSULIN 7.9 05/14/2022   INSULIN 19.0 08/08/2021   INSULIN 7.8 02/20/2021   INSULIN 10.5  07/19/2020   INSULIN 12.7 04/05/2020   Lab Results  Component Value Date   TSH 0.667 11/27/2017   Lab Results  Component Value Date   CHOL 129 05/14/2022   HDL 47 05/14/2022   LDLCALC 71 05/14/2022   TRIG 50 05/14/2022   CHOLHDL 3.7 02/20/2021   Lab Results  Component Value Date   VD25OH 50.8 05/14/2022   VD25OH 56.4 08/08/2021   VD25OH 64.7 02/20/2021   Lab Results  Component Value Date   WBC 8.0 11/27/2017   HGB 13.6 11/27/2017   HCT 38.8 11/27/2017   MCV 86 11/27/2017   PLT 343 04/06/2015   No results found for: "IRON", "TIBC", "FERRITIN"  Attestation Statements:   Reviewed by clinician on day of visit: allergies, medications, problem list, medical history, surgical history, family history, social history, and previous encounter notes.  IMarcille Blanco, CMA, am acting as transcriptionist for Coralie Common, MD  I have reviewed the above documentation for accuracy and completeness, and I agree with the above. - Coralie Common, MD

## 2022-06-18 ENCOUNTER — Ambulatory Visit (INDEPENDENT_AMBULATORY_CARE_PROVIDER_SITE_OTHER): Payer: No Typology Code available for payment source | Admitting: Family Medicine

## 2022-06-18 ENCOUNTER — Encounter (INDEPENDENT_AMBULATORY_CARE_PROVIDER_SITE_OTHER): Payer: Self-pay | Admitting: Family Medicine

## 2022-06-18 VITALS — BP 123/81 | HR 84 | Temp 98.3°F | Ht 67.0 in | Wt 164.0 lb

## 2022-06-18 DIAGNOSIS — E7849 Other hyperlipidemia: Secondary | ICD-10-CM | POA: Diagnosis not present

## 2022-06-18 DIAGNOSIS — E559 Vitamin D deficiency, unspecified: Secondary | ICD-10-CM

## 2022-06-18 DIAGNOSIS — E669 Obesity, unspecified: Secondary | ICD-10-CM | POA: Diagnosis not present

## 2022-06-18 DIAGNOSIS — Z6825 Body mass index (BMI) 25.0-25.9, adult: Secondary | ICD-10-CM | POA: Diagnosis not present

## 2022-06-19 NOTE — Progress Notes (Signed)
Chief Complaint:   OBESITY Robin Knapp is here to discuss her progress with her obesity treatment plan along with follow-up of her obesity related diagnoses. Robin Knapp is on keeping a food journal and adhering to recommended goals of 1200-1400 calories and 85+ grams of protein and states she is following her eating plan approximately 85-90% of the time. Robin Knapp states she is walking/cardio 30-45 minutes 4 times per week.  Today's visit was #: 75 Starting weight: 199 lbs Starting date: 11/27/2017 Today's weight: 164 lbs Today's date: 06/18/2022 Total lbs lost to date: 35 lbs Total lbs lost since last in-office visit: 3  Interim History: Robin Knapp has been trying to stay mindful of food choices and quantity. Opting toward 6 oz meals so close to 18 oz daily. No upcoming plans. Enjoyed seeing Robin Knapp this past weekend. Work has been very busy. She has been consistent with her activity.  Subjective:   1. Other hyperlipidemia Robin Knapp's last LDL of 71, Trigly 50, HDL of 47. On Lipitor 20 mg daily.  2. Vitamin D deficiency Robin Knapp is on 5k IU daily. She notes fatigue.  Assessment/Plan:   1. Other hyperlipidemia Continue taking Lipitor 20 mg without any changes in dose. LDL at goal.  2. Vitamin D deficiency Continue over the counter Vit D---Repeat Vit D in the Winter.  3. Obesity with current BMI of 25.7 Robin Knapp is currently in the action stage of change. As such, her goal is to continue with weight loss efforts. She has agreed to keeping a food journal and adhering to recommended goals of 1200-1400 calories and 85+ grams of protein daily.   Exercise goals: All adults should avoid inactivity. Some physical activity is better than none, and adults who participate in any amount of physical activity gain some health benefits.  Behavioral modification strategies: increasing lean protein intake, meal planning and cooking strategies, keeping healthy foods in the home, planning for success,  and keeping a strict food journal.  Robin Knapp has agreed to follow-up with our clinic in 6 weeks. She was informed of the importance of frequent follow-up visits to maximize her success with intensive lifestyle modifications for her multiple health conditions.   Objective:   Blood pressure 123/81, pulse 84, temperature 98.3 F (36.8 C), height '5\' 7"'$  (1.702 m), weight 164 lb (74.4 kg), SpO2 100 %. Body mass index is 25.69 kg/m.  General: Cooperative, alert, well developed, in no acute distress. HEENT: Conjunctivae and lids unremarkable. Cardiovascular: Regular rhythm.  Lungs: Normal work of breathing. Neurologic: No focal deficits.   Lab Results  Component Value Date   CREATININE 0.86 05/14/2022   BUN 15 05/14/2022   NA 139 05/14/2022   K 4.1 05/14/2022   CL 99 05/14/2022   CO2 23 05/14/2022   Lab Results  Component Value Date   ALT 21 05/14/2022   AST 18 05/14/2022   ALKPHOS 32 (L) 05/14/2022   BILITOT 0.5 05/14/2022   Lab Results  Component Value Date   HGBA1C 5.4 05/14/2022   HGBA1C 5.5 08/08/2021   HGBA1C 5.4 02/20/2021   HGBA1C 5.8 (H) 07/19/2020   HGBA1C 5.5 04/05/2020   Lab Results  Component Value Date   INSULIN 7.9 05/14/2022   INSULIN 19.0 08/08/2021   INSULIN 7.8 02/20/2021   INSULIN 10.5 07/19/2020   INSULIN 12.7 04/05/2020   Lab Results  Component Value Date   TSH 0.667 11/27/2017   Lab Results  Component Value Date   CHOL 129 05/14/2022   HDL 47 05/14/2022  LDLCALC 71 05/14/2022   TRIG 50 05/14/2022   CHOLHDL 3.7 02/20/2021   Lab Results  Component Value Date   VD25OH 50.8 05/14/2022   VD25OH 56.4 08/08/2021   VD25OH 64.7 02/20/2021   Lab Results  Component Value Date   WBC 8.0 11/27/2017   HGB 13.6 11/27/2017   HCT 38.8 11/27/2017   MCV 86 11/27/2017   PLT 343 04/06/2015   No results found for: "IRON", "TIBC", "FERRITIN"  Attestation Statements:   Reviewed by clinician on day of visit: allergies, medications, problem list,  medical history, surgical history, family history, social history, and previous encounter notes.  I, Robin Knapp, RMA am acting as transcriptionist for Coralie Common, MD. I have reviewed the above documentation for accuracy and completeness, and I agree with the above. - Coralie Common, MD

## 2022-07-30 ENCOUNTER — Encounter (INDEPENDENT_AMBULATORY_CARE_PROVIDER_SITE_OTHER): Payer: Self-pay | Admitting: Family Medicine

## 2022-07-30 ENCOUNTER — Ambulatory Visit (INDEPENDENT_AMBULATORY_CARE_PROVIDER_SITE_OTHER): Payer: No Typology Code available for payment source | Admitting: Family Medicine

## 2022-07-30 ENCOUNTER — Other Ambulatory Visit (HOSPITAL_COMMUNITY): Payer: Self-pay

## 2022-07-30 VITALS — BP 146/83 | HR 80 | Temp 97.9°F | Ht 67.0 in | Wt 159.0 lb

## 2022-07-30 DIAGNOSIS — Z6825 Body mass index (BMI) 25.0-25.9, adult: Secondary | ICD-10-CM

## 2022-07-30 DIAGNOSIS — E669 Obesity, unspecified: Secondary | ICD-10-CM | POA: Diagnosis not present

## 2022-07-30 DIAGNOSIS — E7849 Other hyperlipidemia: Secondary | ICD-10-CM

## 2022-07-30 DIAGNOSIS — R7303 Prediabetes: Secondary | ICD-10-CM

## 2022-07-30 MED ORDER — SEMAGLUTIDE(0.25 OR 0.5MG/DOS) 2 MG/3ML ~~LOC~~ SOPN
0.5000 mg | PEN_INJECTOR | SUBCUTANEOUS | 0 refills | Status: DC
  Filled 2022-07-30: qty 9, 84d supply, fill #0

## 2022-08-13 ENCOUNTER — Other Ambulatory Visit (HOSPITAL_COMMUNITY): Payer: Self-pay

## 2022-08-13 MED ORDER — VALACYCLOVIR HCL 500 MG PO TABS
500.0000 mg | ORAL_TABLET | Freq: Every day | ORAL | 1 refills | Status: DC
  Filled 2022-08-13: qty 14, 14d supply, fill #0
  Filled 2022-10-23 – 2022-11-29 (×2): qty 14, 14d supply, fill #1

## 2022-08-14 NOTE — Progress Notes (Signed)
Chief Complaint:   OBESITY Robin Knapp is here to discuss her progress with her obesity treatment plan along with follow-up of her obesity related diagnoses. Robin Knapp is on keeping a food journal and adhering to recommended goals of 1200-1400 calories and 85+ grams of protein and states she is following her eating plan approximately 80% of the time. Robin Knapp states she is walking/weights 25-32 minutes 2-3 times per week.  Today's visit was #: 58 Starting weight: 199 lbs Starting date: 11/27/2017 Today's weight: 159 lbs Today's date: 07/30/2022 Total lbs lost to date: 40 lbs Total lbs lost since last in-office visit: 5  Interim History: Robin Knapp did celebrate her birthday recently and was out of town for [employer]th sor[employer] festivities. Has been able to be fairly regimented about food choices and quantity. She is working Thanksgiving.  Subjective:   1. Pre-diabetes Robin Knapp is on Ozempic 0.5 mg weekly. Her A1c was 5.4, Insulin was 7.9.  2. Other hyperlipidemia Robin Knapp's LDL at 71, HDL at 47, Trigly at 50. She is on Lipitor.  Assessment/Plan:   1. Pre-diabetes We refill Ozempic 0.5 mg SubQ weekly for 1 month with 0 refills.  -Refill Semaglutide,0.25 or 0.'5MG'$ /DOS, 2 MG/3ML SOPN; Inject 0.5 mg into the skin once a week.  Dispense: 9 mL; Refill: 0  2. Other hyperlipidemia Continue taking Lipitor.  3. Obesity with current BMI of 25.0 Robin Knapp is currently in the action stage of change. As such, her goal is to continue with weight loss efforts. She has agreed to keeping a food journal and adhering to recommended goals of 1200-1400 calories and 85+ grams of protein daily.   Exercise goals: All adults should avoid inactivity. Some physical activity is better than none, and adults who participate in any amount of physical activity gain some health benefits.  Behavioral modification strategies: increasing lean protein intake, meal planning and cooking strategies, keeping healthy foods in the home,  holiday eating strategies , and keeping a strict food journal.  Robin Knapp has agreed to follow-up with our clinic in 8 weeks. She was informed of the importance of frequent follow-up visits to maximize her success with intensive lifestyle modifications for her multiple health conditions.   Objective:   Blood pressure (!) 146/83, pulse 80, temperature 97.9 F (36.6 C), height '5\' 7"'$  (1.702 m), weight 159 lb (72.1 kg), SpO2 100 %. Body mass index is 24.9 kg/m.  General: Cooperative, alert, well developed, in no acute distress. HEENT: Conjunctivae and lids unremarkable. Cardiovascular: Regular rhythm.  Lungs: Normal work of breathing. Neurologic: No focal deficits.   Lab Results  Component Value Date   CREATININE 0.86 05/14/2022   BUN 15 05/14/2022   NA 139 05/14/2022   K 4.1 05/14/2022   CL 99 05/14/2022   CO2 23 05/14/2022   Lab Results  Component Value Date   ALT 21 05/14/2022   AST 18 05/14/2022   ALKPHOS 32 (L) 05/14/2022   BILITOT 0.5 05/14/2022   Lab Results  Component Value Date   HGBA1C 5.4 05/14/2022   HGBA1C 5.5 08/08/2021   HGBA1C 5.4 02/20/2021   HGBA1C 5.8 (H) 07/19/2020   HGBA1C 5.5 04/05/2020   Lab Results  Component Value Date   INSULIN 7.9 05/14/2022   INSULIN 19.0 08/08/2021   INSULIN 7.8 02/20/2021   INSULIN 10.5 07/19/2020   INSULIN 12.7 04/05/2020   Lab Results  Component Value Date   TSH 0.667 11/27/2017   Lab Results  Component Value Date   CHOL 129 05/14/2022   HDL 47  05/14/2022   LDLCALC 71 05/14/2022   TRIG 50 05/14/2022   CHOLHDL 3.7 02/20/2021   Lab Results  Component Value Date   VD25OH 50.8 05/14/2022   VD25OH 56.4 08/08/2021   VD25OH 64.7 02/20/2021   Lab Results  Component Value Date   WBC 8.0 11/27/2017   HGB 13.6 11/27/2017   HCT 38.8 11/27/2017   MCV 86 11/27/2017   PLT 343 04/06/2015   No results found for: "IRON", "TIBC", "FERRITIN"  Attestation Statements:   Reviewed by clinician on day of visit:  allergies, medications, problem list, medical history, surgical history, family history, social history, and previous encounter notes.  I, Elnora Morrison, RMA am acting as transcriptionist for Coralie Common, MD.  I have reviewed the above documentation for accuracy and completeness, and I agree with the above. - Coralie Common, MD

## 2022-08-15 ENCOUNTER — Other Ambulatory Visit (HOSPITAL_COMMUNITY): Payer: Self-pay

## 2022-09-24 ENCOUNTER — Ambulatory Visit (INDEPENDENT_AMBULATORY_CARE_PROVIDER_SITE_OTHER): Payer: 59 | Admitting: Family Medicine

## 2022-09-24 ENCOUNTER — Other Ambulatory Visit (HOSPITAL_COMMUNITY): Payer: Self-pay

## 2022-09-24 ENCOUNTER — Encounter (INDEPENDENT_AMBULATORY_CARE_PROVIDER_SITE_OTHER): Payer: Self-pay | Admitting: Family Medicine

## 2022-09-24 VITALS — BP 128/79 | HR 87 | Temp 98.3°F | Ht 67.0 in | Wt 163.0 lb

## 2022-09-24 DIAGNOSIS — R7303 Prediabetes: Secondary | ICD-10-CM

## 2022-09-24 DIAGNOSIS — Z6825 Body mass index (BMI) 25.0-25.9, adult: Secondary | ICD-10-CM | POA: Diagnosis not present

## 2022-09-24 DIAGNOSIS — E669 Obesity, unspecified: Secondary | ICD-10-CM | POA: Diagnosis not present

## 2022-09-24 DIAGNOSIS — E7849 Other hyperlipidemia: Secondary | ICD-10-CM

## 2022-09-24 MED ORDER — SEMAGLUTIDE(0.25 OR 0.5MG/DOS) 2 MG/3ML ~~LOC~~ SOPN
0.5000 mg | PEN_INJECTOR | SUBCUTANEOUS | 0 refills | Status: DC
  Filled 2022-09-24: qty 9, 84d supply, fill #0
  Filled 2022-10-23: qty 3, 28d supply, fill #0
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
## 2022-10-11 ENCOUNTER — Other Ambulatory Visit (HOSPITAL_COMMUNITY): Payer: Self-pay

## 2022-10-11 MED ORDER — FLUCONAZOLE 150 MG PO TABS
150.0000 mg | ORAL_TABLET | ORAL | 1 refills | Status: DC
  Filled 2022-10-11: qty 1, 1d supply, fill #0

## 2022-10-18 ENCOUNTER — Other Ambulatory Visit (HOSPITAL_COMMUNITY): Payer: Self-pay

## 2022-10-18 DIAGNOSIS — N9419 Other specified dyspareunia: Secondary | ICD-10-CM | POA: Diagnosis not present

## 2022-10-18 DIAGNOSIS — M25559 Pain in unspecified hip: Secondary | ICD-10-CM | POA: Diagnosis not present

## 2022-10-18 DIAGNOSIS — Z01419 Encounter for gynecological examination (general) (routine) without abnormal findings: Secondary | ICD-10-CM | POA: Diagnosis not present

## 2022-10-18 DIAGNOSIS — N921 Excessive and frequent menstruation with irregular cycle: Secondary | ICD-10-CM | POA: Diagnosis not present

## 2022-10-18 DIAGNOSIS — Z30431 Encounter for routine checking of intrauterine contraceptive device: Secondary | ICD-10-CM | POA: Diagnosis not present

## 2022-10-18 DIAGNOSIS — Z975 Presence of (intrauterine) contraceptive device: Secondary | ICD-10-CM | POA: Diagnosis not present

## 2022-10-18 MED ORDER — NORETHINDRONE ACETATE 5 MG PO TABS
5.0000 mg | ORAL_TABLET | Freq: Every day | ORAL | 3 refills | Status: DC
  Filled 2022-10-18: qty 30, 30d supply, fill #0
  Filled 2022-11-29: qty 30, 30d supply, fill #1
  Filled 2022-12-26: qty 30, 30d supply, fill #2
  Filled 2023-01-31: qty 30, 30d supply, fill #3

## 2022-10-19 ENCOUNTER — Other Ambulatory Visit (HOSPITAL_COMMUNITY): Payer: Self-pay

## 2022-10-24 ENCOUNTER — Other Ambulatory Visit (HOSPITAL_COMMUNITY): Payer: Self-pay

## 2022-10-24 ENCOUNTER — Other Ambulatory Visit: Payer: Self-pay

## 2022-10-25 ENCOUNTER — Telehealth (INDEPENDENT_AMBULATORY_CARE_PROVIDER_SITE_OTHER): Payer: Self-pay

## 2022-10-25 NOTE — Telephone Encounter (Signed)
(  Key: [id number]) Ozempic (0.25 or 0.5 MG/DOSE) '2MG'$ /3ML pen-injectors  Prior auth submitted thru covermymeds.

## 2022-11-05 ENCOUNTER — Other Ambulatory Visit (HOSPITAL_COMMUNITY): Payer: Self-pay

## 2022-11-06 ENCOUNTER — Ambulatory Visit (INDEPENDENT_AMBULATORY_CARE_PROVIDER_SITE_OTHER): Payer: 59 | Admitting: Family Medicine

## 2022-11-06 ENCOUNTER — Other Ambulatory Visit (HOSPITAL_COMMUNITY): Payer: Self-pay

## 2022-11-06 ENCOUNTER — Encounter (INDEPENDENT_AMBULATORY_CARE_PROVIDER_SITE_OTHER): Payer: Self-pay | Admitting: Family Medicine

## 2022-11-06 VITALS — BP 121/82 | HR 82 | Temp 98.3°F | Ht 67.0 in | Wt 166.0 lb

## 2022-11-06 DIAGNOSIS — Z6826 Body mass index (BMI) 26.0-26.9, adult: Secondary | ICD-10-CM | POA: Diagnosis not present

## 2022-11-06 DIAGNOSIS — E559 Vitamin D deficiency, unspecified: Secondary | ICD-10-CM

## 2022-11-06 DIAGNOSIS — E669 Obesity, unspecified: Secondary | ICD-10-CM | POA: Diagnosis not present

## 2022-11-06 MED ORDER — WEGOVY 0.5 MG/0.5ML ~~LOC~~ SOAJ
0.5000 mg | SUBCUTANEOUS | 0 refills | Status: DC
  Filled 2022-11-06 – 2022-11-07 (×2): qty 2, 28d supply, fill #0

## 2022-11-06 NOTE — Progress Notes (Signed)
Patient has been mostly working and dealing with more stress recently.  Feels like she lost some motivation to exercise recently.  She feels like she may be working more recently.  She wants to be more focused in her exercise. Next few weeks she doesn't anticipate any upcoming events, travel or activities. Patient received a text stating she would not get Ozempic anymore as insurance would not cover it any longer.

## 2022-11-07 ENCOUNTER — Telehealth (INDEPENDENT_AMBULATORY_CARE_PROVIDER_SITE_OTHER): Payer: Self-pay

## 2022-11-07 ENCOUNTER — Other Ambulatory Visit (HOSPITAL_COMMUNITY): Payer: Self-pay

## 2022-11-07 NOTE — Telephone Encounter (Signed)
Verbal PA done 11/07/22 with Arrie Aran, pending auth # [id number]

## 2022-11-12 ENCOUNTER — Encounter (INDEPENDENT_AMBULATORY_CARE_PROVIDER_SITE_OTHER): Payer: Self-pay

## 2022-11-13 NOTE — Progress Notes (Signed)
Chief Complaint:   OBESITY Robin Knapp is here to discuss her progress with her obesity treatment plan along with follow-up of her obesity related diagnoses. Robin Knapp is on keeping a food journal and adhering to recommended goals of 1200-1400 calories and 85+ grams of protein and states she is following her eating plan approximately 90% of the time. Robin Knapp.  Today's visit was #: 53 Starting weight: 199 lbs Starting date: 3/13/20219 Today's weight: 166 lbs Today's date: 11/06/2022 Total lbs lost to date: 33 lbs Total lbs lost since last in-office visit: 0  Interim History:Robin Knapp has been mostly working and dealing with more stress recently.  Feels like she lost some motivation to exercise recently.  She feels like she may be working more recently.  She wants to be more focused in her exercise. Next few weeks she doesn't anticipate any upcoming events, travel or activities. Patient received a text stating she would not get Ozempic anymore as insurance would not cover it any longer.   Subjective:   1. Vitamin D deficiency Robin Knapp is currently taking prescription Vit D 50,000 IU once a Knapp.   She notes fatigue.  Assessment/Plan:   1. Vitamin D deficiency Will repeat labs at next appointment.  2. Obesity with current BMI of 26.0 Robin Knapp is currently in the action stage of change. As such, her goal is to continue with weight loss efforts. She has agreed to keeping a food journal and adhering to recommended goals of 1200-1400 calories and 85+ grams of protein daily.   Exercise goals: As is.  Behavioral modification strategies: increasing lean protein intake, meal planning and cooking strategies, keeping healthy foods in the home, and planning for success.  Robin Knapp has agreed to follow-up with our clinic in 6 weeks. She was informed of the importance of frequent follow-up visits to maximize her success with intensive  lifestyle modifications for her multiple health conditions.   Objective:   Blood pressure 121/82, pulse 82, temperature 98.3 F (36.8 C), height '5\' 7"'$  (1.702 m), weight 166 lb (75.3 kg), SpO2 100 %. Body mass index is 26 kg/m.  General: Cooperative, alert, well developed, in no acute distress. HEENT: Conjunctivae and lids unremarkable. Cardiovascular: Regular rhythm.  Lungs: Normal work of breathing. Neurologic: No focal deficits.   Lab Results  Component Value Date   CREATININE 0.86 05/14/2022   BUN 15 05/14/2022   NA 139 05/14/2022   K 4.1 05/14/2022   CL 99 05/14/2022   CO2 23 05/14/2022   Lab Results  Component Value Date   ALT 21 05/14/2022   AST 18 05/14/2022   ALKPHOS 32 (L) 05/14/2022   BILITOT 0.5 05/14/2022   Lab Results  Component Value Date   HGBA1C 5.4 05/14/2022   HGBA1C 5.5 08/08/2021   HGBA1C 5.4 02/20/2021   HGBA1C 5.8 (H) 07/19/2020   HGBA1C 5.5 04/05/2020   Lab Results  Component Value Date   INSULIN 7.9 05/14/2022   INSULIN 19.0 08/08/2021   INSULIN 7.8 02/20/2021   INSULIN 10.5 07/19/2020   INSULIN 12.7 04/05/2020   Lab Results  Component Value Date   TSH 0.667 11/27/2017   Lab Results  Component Value Date   CHOL 129 05/14/2022   HDL 47 05/14/2022   LDLCALC 71 05/14/2022   TRIG 50 05/14/2022   CHOLHDL 3.7 02/20/2021   Lab Results  Component Value Date   VD25OH 50.8 05/14/2022   VD25OH 56.4 08/08/2021  VD25OH 64.7 02/20/2021   Lab Results  Component Value Date   WBC 8.0 11/27/2017   HGB 13.6 11/27/2017   HCT 38.8 11/27/2017   MCV 86 11/27/2017   PLT 343 04/06/2015   No results found for: "IRON", "TIBC", "FERRITIN"  Attestation Statements:   Reviewed by clinician on day of visit: allergies, medications, problem list, medical history, surgical history, family history, social history, and previous encounter notes.  I, Elnora Morrison, RMA am acting as transcriptionist for Coralie Common, MD. I have reviewed the above  documentation for accuracy and completeness, and I agree with the above. - Coralie Common, MD

## 2022-11-15 ENCOUNTER — Encounter (INDEPENDENT_AMBULATORY_CARE_PROVIDER_SITE_OTHER): Payer: Self-pay | Admitting: Family Medicine

## 2022-11-15 ENCOUNTER — Other Ambulatory Visit (HOSPITAL_COMMUNITY): Payer: Self-pay

## 2022-11-15 ENCOUNTER — Other Ambulatory Visit (INDEPENDENT_AMBULATORY_CARE_PROVIDER_SITE_OTHER): Payer: Self-pay | Admitting: Family Medicine

## 2022-11-15 MED ORDER — WEGOVY 0.5 MG/0.5ML ~~LOC~~ SOAJ
0.5000 mg | SUBCUTANEOUS | 0 refills | Status: DC
  Filled 2022-11-15: qty 2, 28d supply, fill #0
  Filled 2022-12-26: qty 2, 28d supply, fill #1
  Filled 2023-01-26 – 2023-01-31 (×2): qty 2, 28d supply, fill #2

## 2022-11-26 ENCOUNTER — Other Ambulatory Visit (HOSPITAL_COMMUNITY): Payer: Self-pay

## 2022-12-11 ENCOUNTER — Ambulatory Visit (INDEPENDENT_AMBULATORY_CARE_PROVIDER_SITE_OTHER): Payer: 59 | Admitting: Family Medicine

## 2022-12-25 ENCOUNTER — Encounter (INDEPENDENT_AMBULATORY_CARE_PROVIDER_SITE_OTHER): Payer: Self-pay | Admitting: Family Medicine

## 2022-12-25 ENCOUNTER — Ambulatory Visit (INDEPENDENT_AMBULATORY_CARE_PROVIDER_SITE_OTHER): Payer: 59 | Admitting: Family Medicine

## 2022-12-25 VITALS — BP 128/80 | HR 77 | Temp 98.5°F | Ht 67.0 in | Wt 167.0 lb

## 2022-12-25 DIAGNOSIS — E7849 Other hyperlipidemia: Secondary | ICD-10-CM | POA: Diagnosis not present

## 2022-12-25 DIAGNOSIS — E669 Obesity, unspecified: Secondary | ICD-10-CM

## 2022-12-25 DIAGNOSIS — Z6826 Body mass index (BMI) 26.0-26.9, adult: Secondary | ICD-10-CM | POA: Diagnosis not present

## 2022-12-25 NOTE — Progress Notes (Signed)
Chief Complaint:   OBESITY Robin Knapp is here to discuss her progress with her obesity treatment plan along with follow-up of her obesity related diagnoses. Robin Knapp is on keeping a food journal and adhering to recommended goals of 1200-1400 calories and 85+ protein and states she is following her eating plan approximately 50% of the time. Robin Knapp states she is exercise 35-45 minutes 1-2 times per week.  Today's visit was #: 79 Starting weight: 199 lbs Starting date: 11/27/2017 Today's weight: 167 lbs Today's date: 12/25/2022 Total lbs lost to date: 32 lbs Total lbs lost since last in-office visit: +1 lb  Interim History: Patient has been under a significant amount of stress with work and how busy it is.  Work is getting more demanding due to people leaving.  She is dealing with construction issues at home concerning about her bathroom.   Subjective:   1. Other hyperlipidemia Patient on Lipitor daily. No side effects needed.   Assessment/Plan:   1. Other hyperlipidemia Continue Lipitor, no change in dose.   2. Obesity with current BMI of 26.2 Continue Wegovy 0.5 mg weekly, no refill needed.   Robin Knapp is currently in the action stage of change. As such, her goal is to continue with weight loss efforts. She has agreed to keeping a food journal and adhering to recommended goals of 1200-1400 calories and 85+ protein.   Exercise goals: All adults should avoid inactivity. Some physical activity is better than none, and adults who participate in any amount of physical activity gain some health benefits.  Behavioral modification strategies: increasing lean protein intake, meal planning and cooking strategies, keeping healthy foods in the home, and planning for success.  Robin Knapp has agreed to follow-up with our clinic in 6 weeks. She was informed of the importance of frequent follow-up visits to maximize her success with intensive lifestyle modifications for her multiple health  conditions.   Objective:   Blood pressure 128/80, pulse 77, temperature 98.5 F (36.9 C), height 5\' 7"  (1.702 m), weight 167 lb (75.8 kg), SpO2 100 %. Body mass index is 26.16 kg/m.  General: Cooperative, alert, well developed, in no acute distress. HEENT: Conjunctivae and lids unremarkable. Cardiovascular: Regular rhythm.  Lungs: Normal work of breathing. Neurologic: No focal deficits.   Lab Results  Component Value Date   CREATININE 0.86 05/14/2022   BUN 15 05/14/2022   NA 139 05/14/2022   K 4.1 05/14/2022   CL 99 05/14/2022   CO2 23 05/14/2022   Lab Results  Component Value Date   ALT 21 05/14/2022   AST 18 05/14/2022   ALKPHOS 32 (L) 05/14/2022   BILITOT 0.5 05/14/2022   Lab Results  Component Value Date   HGBA1C 5.4 05/14/2022   HGBA1C 5.5 08/08/2021   HGBA1C 5.4 02/20/2021   HGBA1C 5.8 (H) 07/19/2020   HGBA1C 5.5 04/05/2020   Lab Results  Component Value Date   INSULIN 7.9 05/14/2022   INSULIN 19.0 08/08/2021   INSULIN 7.8 02/20/2021   INSULIN 10.5 07/19/2020   INSULIN 12.7 04/05/2020   Lab Results  Component Value Date   TSH 0.667 11/27/2017   Lab Results  Component Value Date   CHOL 129 05/14/2022   HDL 47 05/14/2022   LDLCALC 71 05/14/2022   TRIG 50 05/14/2022   CHOLHDL 3.7 02/20/2021   Lab Results  Component Value Date   VD25OH 50.8 05/14/2022   VD25OH 56.4 08/08/2021   VD25OH 64.7 02/20/2021   Lab Results  Component Value Date  WBC 8.0 11/27/2017   HGB 13.6 11/27/2017   HCT 38.8 11/27/2017   MCV 86 11/27/2017   PLT 343 04/06/2015   No results found for: "IRON", "TIBC", "FERRITIN"  Attestation Statements:   Reviewed by clinician on day of visit: allergies, medications, problem list, medical history, surgical history, family history, social history, and previous encounter notes.  I, Malcolm Metro, RMA, am acting as transcriptionist for Reuben Likes, MD.  I have reviewed the above documentation for accuracy and  completeness, and I agree with the above. - Reuben Likes, MD

## 2022-12-27 ENCOUNTER — Other Ambulatory Visit (HOSPITAL_COMMUNITY): Payer: Self-pay

## 2023-01-08 DIAGNOSIS — E663 Overweight: Secondary | ICD-10-CM | POA: Diagnosis not present

## 2023-01-08 DIAGNOSIS — E7800 Pure hypercholesterolemia, unspecified: Secondary | ICD-10-CM | POA: Diagnosis not present

## 2023-01-08 DIAGNOSIS — E559 Vitamin D deficiency, unspecified: Secondary | ICD-10-CM | POA: Diagnosis not present

## 2023-01-08 DIAGNOSIS — E282 Polycystic ovarian syndrome: Secondary | ICD-10-CM | POA: Diagnosis not present

## 2023-01-08 DIAGNOSIS — J302 Other seasonal allergic rhinitis: Secondary | ICD-10-CM | POA: Diagnosis not present

## 2023-01-08 DIAGNOSIS — Z79899 Other long term (current) drug therapy: Secondary | ICD-10-CM | POA: Diagnosis not present

## 2023-01-08 DIAGNOSIS — Z0000 Encounter for general adult medical examination without abnormal findings: Secondary | ICD-10-CM | POA: Diagnosis not present

## 2023-01-08 DIAGNOSIS — Z23 Encounter for immunization: Secondary | ICD-10-CM | POA: Diagnosis not present

## 2023-01-08 DIAGNOSIS — E161 Other hypoglycemia: Secondary | ICD-10-CM | POA: Diagnosis not present

## 2023-01-08 DIAGNOSIS — N951 Menopausal and female climacteric states: Secondary | ICD-10-CM | POA: Diagnosis not present

## 2023-01-30 ENCOUNTER — Other Ambulatory Visit (HOSPITAL_COMMUNITY): Payer: Self-pay

## 2023-01-31 ENCOUNTER — Other Ambulatory Visit: Payer: Self-pay

## 2023-02-05 ENCOUNTER — Other Ambulatory Visit (HOSPITAL_COMMUNITY): Payer: Self-pay

## 2023-02-05 ENCOUNTER — Ambulatory Visit (INDEPENDENT_AMBULATORY_CARE_PROVIDER_SITE_OTHER): Payer: 59 | Admitting: Family Medicine

## 2023-02-05 ENCOUNTER — Encounter (INDEPENDENT_AMBULATORY_CARE_PROVIDER_SITE_OTHER): Payer: Self-pay | Admitting: Family Medicine

## 2023-02-05 VITALS — BP 123/81 | HR 77 | Temp 98.6°F | Ht 67.0 in | Wt 173.0 lb

## 2023-02-05 DIAGNOSIS — E66811 Obesity, class 1: Secondary | ICD-10-CM

## 2023-02-05 DIAGNOSIS — E669 Obesity, unspecified: Secondary | ICD-10-CM | POA: Diagnosis not present

## 2023-02-05 DIAGNOSIS — F5089 Other specified eating disorder: Secondary | ICD-10-CM | POA: Diagnosis not present

## 2023-02-05 DIAGNOSIS — E7849 Other hyperlipidemia: Secondary | ICD-10-CM

## 2023-02-05 DIAGNOSIS — Z6827 Body mass index (BMI) 27.0-27.9, adult: Secondary | ICD-10-CM | POA: Diagnosis not present

## 2023-02-05 MED ORDER — LOMAIRA 8 MG PO TABS
ORAL_TABLET | ORAL | 0 refills | Status: DC
  Filled 2023-02-05: qty 23, 30d supply, fill #0

## 2023-02-05 MED ORDER — TOPIRAMATE 25 MG PO TABS
ORAL_TABLET | ORAL | 0 refills | Status: DC
  Filled 2023-02-05: qty 46, 30d supply, fill #0

## 2023-02-05 NOTE — Progress Notes (Signed)
Chief Complaint:   OBESITY Robin Knapp is here to discuss her progress with her obesity treatment plan along with follow-up of her obesity related diagnoses. Robin Knapp is on keeping a food journal and adhering to recommended goals of 1200-1400 calories and 85+ grams of protein and states she is following her eating plan approximately 50% of the time. 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  Today's visit was #: 80 Starting weight: 199 lbs Starting date: 11/27/2017 Today's weight: 173 lbs Today's date: 02/05/2023 Total lbs lost to date: 26 Total lbs lost since last in-office visit: 0  Interim History: Patient feeling lazy about journaling.  She mentions she was giving herself a self talk about getting back on plan.  Patient is still working on getting her bathroom finished.  Patient realizes that over the next month she doesn't have much planned but has a trip in July.  She is working extra for this.   Subjective:   1. Other hyperlipidemia Robin Knapp is on Lipitor daily. Her last LDL was 80, HDL 53, and triglycerides 71.  2. Other disorder of eating Robin Knapp was previously on Wegovy, but it is not covered by her insurance anymore.   Assessment/Plan:   1. Other hyperlipidemia Robin Knapp will continue Lipitor with no change in dose.   2. Other disorder of eating Robin Knapp agreed to start Lomaira at 4 mg; and start topiramate at 25 mg with no refills.   - Phentermine HCl (LOMAIRA) 8 MG TABS; Take 1/2 tablet (4 mg total) by mouth daily for 14 days, THEN 1 tablet (8 mg total) daily for 14 days.  Dispense: 23 tablet; Refill: 0 - topiramate (TOPAMAX) 25 MG tablet; Take 1 tablet (25 mg total) by mouth daily for 14 days, THEN 2 tablets (50 mg total) daily for 14 days.  Dispense: 46 tablet; Refill: 0  3. BMI 27.0-27.9,adult  4. Obesity with starting BMI of 33.1 Robin Knapp is currently in the action stage of change. As such, her goal is to continue with weight loss  efforts. She has agreed to keeping a food journal and adhering to recommended goals of 1200-1400 calories and 85+ grams of protein daily.   Exercise goals: All adults should avoid inactivity. Some physical activity is better than none, and adults who participate in any amount of physical activity gain some health benefits.  Behavioral modification strategies: increasing lean protein intake, meal planning and cooking strategies, keeping healthy foods in the home, and planning for success.  Robin Knapp has agreed to follow-up with our clinic in 4 weeks. She was informed of the importance of frequent follow-up visits to maximize her success with intensive lifestyle modifications for her multiple health conditions.   Objective:   Blood pressure 123/81, pulse 77, temperature 98.6 F (37 C), height 5\' 7"  (1.702 m), weight 173 lb (78.5 kg), SpO2 100 %. Body mass index is 27.1 kg/m.  General: Cooperative, alert, well developed, in no acute distress. HEENT: Conjunctivae and lids unremarkable. Cardiovascular: Regular rhythm.  Lungs: Normal work of breathing. Neurologic: No focal deficits.   Lab Results  Component Value Date   CREATININE 0.86 05/14/2022   BUN 15 05/14/2022   NA 139 05/14/2022   K 4.1 05/14/2022   CL 99 05/14/2022   CO2 23 05/14/2022   Lab Results  Component Value Date   ALT 21 05/14/2022   AST 18 05/14/2022   ALKPHOS 32 (L) 05/14/2022   BILITOT 0.5 05/14/2022   Lab Results  Component Value Date   HGBA1C 5.4 05/14/2022   HGBA1C 5.5 08/08/2021   HGBA1C 5.4 02/20/2021   HGBA1C 5.8 (H) 07/19/2020   HGBA1C 5.5 04/05/2020   Lab Results  Component Value Date   INSULIN 7.9 05/14/2022   INSULIN 19.0 08/08/2021   INSULIN 7.8 02/20/2021   INSULIN 10.5 07/19/2020   INSULIN 12.7 04/05/2020   Lab Results  Component Value Date   TSH 0.667 11/27/2017   Lab Results  Component Value Date   CHOL 129 05/14/2022   HDL 47 05/14/2022   LDLCALC 71 05/14/2022   TRIG 50  05/14/2022   CHOLHDL 3.7 02/20/2021   Lab Results  Component Value Date   VD25OH 50.8 05/14/2022   VD25OH 56.4 08/08/2021   VD25OH 64.7 02/20/2021   Lab Results  Component Value Date   WBC 8.0 11/27/2017   HGB 13.6 11/27/2017   HCT 38.8 11/27/2017   MCV 86 11/27/2017   PLT 343 04/06/2015   No results found for: "IRON", "TIBC", "FERRITIN"  Attestation Statements:   Reviewed by clinician on day of visit: allergies, medications, problem list, medical history, surgical history, family history, social history, and previous encounter notes.   I, Robin Knapp, am acting as transcriptionist for Robin Likes, MD.  I have reviewed the above documentation for accuracy and completeness, and I agree with the above. - Robin Likes, MD

## 2023-02-06 ENCOUNTER — Encounter (INDEPENDENT_AMBULATORY_CARE_PROVIDER_SITE_OTHER): Payer: Self-pay | Admitting: Family Medicine

## 2023-03-05 ENCOUNTER — Ambulatory Visit (INDEPENDENT_AMBULATORY_CARE_PROVIDER_SITE_OTHER): Payer: 59 | Admitting: Family Medicine

## 2023-03-05 ENCOUNTER — Encounter (INDEPENDENT_AMBULATORY_CARE_PROVIDER_SITE_OTHER): Payer: Self-pay | Admitting: Family Medicine

## 2023-03-05 ENCOUNTER — Other Ambulatory Visit (HOSPITAL_COMMUNITY): Payer: Self-pay

## 2023-03-05 VITALS — BP 139/85 | HR 68 | Temp 98.8°F | Ht 67.0 in | Wt 173.0 lb

## 2023-03-05 DIAGNOSIS — Z6827 Body mass index (BMI) 27.0-27.9, adult: Secondary | ICD-10-CM | POA: Diagnosis not present

## 2023-03-05 DIAGNOSIS — E559 Vitamin D deficiency, unspecified: Secondary | ICD-10-CM | POA: Diagnosis not present

## 2023-03-05 DIAGNOSIS — R632 Polyphagia: Secondary | ICD-10-CM | POA: Diagnosis not present

## 2023-03-05 DIAGNOSIS — E669 Obesity, unspecified: Secondary | ICD-10-CM

## 2023-03-05 MED ORDER — TOPIRAMATE 25 MG PO TABS
75.0000 mg | ORAL_TABLET | Freq: Every day | ORAL | 0 refills | Status: DC
Start: 2023-03-05 — End: 2023-04-08
  Filled 2023-03-05: qty 90, 30d supply, fill #0

## 2023-03-05 MED ORDER — LOMAIRA 8 MG PO TABS
12.0000 mg | ORAL_TABLET | Freq: Every day | ORAL | 0 refills | Status: DC
  Filled 2023-03-05: qty 45, 30d supply, fill #0

## 2023-03-05 NOTE — Progress Notes (Signed)
Chief Complaint:   OBESITY Robin Knapp is here to discuss her progress with her obesity treatment plan along with follow-up of her obesity related diagnoses. Robin Knapp is on keeping a food journal and adhering to recommended goals of 1200-1400 calories and 85+ grams of protein and states she is following her eating plan approximately 80% of the time. Robin Knapp states she is doing cardio and HIT workout for 60 minutes 2 times per week.  Today's visit was #: 81 Starting weight: 199 lbs Starting date: 11/27/2017 Today's weight: 173 lbs Today's date: 03/05/2023 Total lbs lost to date: 26 Total lbs lost since last in-office visit: 0  Interim History: Patient is still dealing with her bathroom remodel and getting the work done. She has been working and helping her daughter prepare for her [place] trip.  Patient is going on her trip in 2 weeks and is now starting to prepare for that.  Her trip is July 4th to the 12th.  Hasn't been doing as well food wise the last few weeks.  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 She is planning on being more aware of food choices.  Subjective:   1. Polyphagia Patient is on combination Lomaira and topiramate.  She reports minimum symptom control on current medication dosage.  PDMP was checked with no concerns.  2. Vitamin D deficiency Patient's last vitamin D level was > 50.  She is on 5000 units daily, and she notes fatigue.  Assessment/Plan:   1. Polyphagia Patient agreed to increase Lomaira to 12 mg daily, and increase topiramate to 75 mg daily and we will refill both for 1 month.  - Phentermine HCl (LOMAIRA) 8 MG TABS; Take 1.5 tablets (12 mg total) by mouth daily.  Dispense: 45 tablet; Refill: 0 - topiramate (TOPAMAX) 25 MG tablet; Take 3 tablets (75 mg total) by mouth daily.  Dispense: 90 tablet; Refill: 0  2. Vitamin D deficiency Patient will continue OTC  vitamin D with no change in dose.  Patient will have labs with her PCP in October.  3. BMI 27.0-27.9,adult  4. Obesity, starting BMI 33.12 Robin Knapp is currently in the action stage of change. As such, her goal is to continue with weight loss efforts. She has agreed to keeping a food journal and adhering to recommended goals of 1200-1400 calories and 85+ grams of protein daily.   Exercise goals: All adults should avoid inactivity. Some physical activity is better than none, and adults who participate in any amount of physical activity gain some health benefits.  Behavioral modification strategies: increasing lean protein intake, meal planning and cooking strategies, travel eating strategies, and planning for success.  Robin Knapp has agreed to follow-up with our clinic in 4 weeks. She was informed of the importance of frequent follow-up visits to maximize her success with intensive lifestyle modifications for her multiple health conditions.   Objective:   Blood pressure 139/85, pulse 68, temperature 98.8 F (37.1 C), height 5\' 7"  (1.702 m), weight 173 lb (78.5 kg), SpO2 100 %. Body mass index is 27.1 kg/m.  General: Cooperative, alert, well developed, in no acute distress. HEENT: Conjunctivae and lids unremarkable. Cardiovascular: Regular rhythm.  Lungs: Normal work of breathing. Neurologic: No focal deficits.   Lab Results  Component Value Date   CREATININE 0.86 05/14/2022   BUN 15 05/14/2022   NA 139 05/14/2022   K 4.1 05/14/2022   CL 99 05/14/2022  CO2 23 05/14/2022   Lab Results  Component Value Date   ALT 21 05/14/2022   AST 18 05/14/2022   ALKPHOS 32 (L) 05/14/2022   BILITOT 0.5 05/14/2022   Lab Results  Component Value Date   HGBA1C 5.4 05/14/2022   HGBA1C 5.5 08/08/2021   HGBA1C 5.4 02/20/2021   HGBA1C 5.8 (H) 07/19/2020   HGBA1C 5.5 04/05/2020   Lab Results  Component Value Date   INSULIN 7.9 05/14/2022   INSULIN 19.0 08/08/2021   INSULIN 7.8 02/20/2021    INSULIN 10.5 07/19/2020   INSULIN 12.7 04/05/2020   Lab Results  Component Value Date   TSH 0.667 11/27/2017   Lab Results  Component Value Date   CHOL 129 05/14/2022   HDL 47 05/14/2022   LDLCALC 71 05/14/2022   TRIG 50 05/14/2022   CHOLHDL 3.7 02/20/2021   Lab Results  Component Value Date   VD25OH 50.8 05/14/2022   VD25OH 56.4 08/08/2021   VD25OH 64.7 02/20/2021   Lab Results  Component Value Date   WBC 8.0 11/27/2017   HGB 13.6 11/27/2017   HCT 38.8 11/27/2017   MCV 86 11/27/2017   PLT 343 04/06/2015   No results found for: "IRON", "TIBC", "FERRITIN"  Attestation Statements:   Reviewed by clinician on day of visit: allergies, medications, problem list, medical history, surgical history, family history, social history, and previous encounter notes.   I, Burt Knack, am acting as transcriptionist for Reuben Likes, MD. I have reviewed the above documentation for accuracy and completeness, and I agree with the above. - Reuben Likes, MD

## 2023-03-06 ENCOUNTER — Other Ambulatory Visit (HOSPITAL_COMMUNITY): Payer: Self-pay

## 2023-03-07 ENCOUNTER — Other Ambulatory Visit (HOSPITAL_COMMUNITY): Payer: Self-pay

## 2023-03-09 DIAGNOSIS — Z1231 Encounter for screening mammogram for malignant neoplasm of breast: Secondary | ICD-10-CM | POA: Diagnosis not present

## 2023-04-02 ENCOUNTER — Other Ambulatory Visit (HOSPITAL_COMMUNITY): Payer: Self-pay

## 2023-04-03 ENCOUNTER — Other Ambulatory Visit (HOSPITAL_COMMUNITY): Payer: Self-pay

## 2023-04-03 MED ORDER — ATORVASTATIN CALCIUM 10 MG PO TABS
10.0000 mg | ORAL_TABLET | Freq: Every day | ORAL | 1 refills | Status: DC
  Filled 2023-04-03: qty 90, 90d supply, fill #0

## 2023-04-08 ENCOUNTER — Other Ambulatory Visit (HOSPITAL_COMMUNITY): Payer: Self-pay

## 2023-04-08 ENCOUNTER — Other Ambulatory Visit: Payer: Self-pay

## 2023-04-08 ENCOUNTER — Encounter (INDEPENDENT_AMBULATORY_CARE_PROVIDER_SITE_OTHER): Payer: Self-pay | Admitting: Family Medicine

## 2023-04-08 ENCOUNTER — Ambulatory Visit (INDEPENDENT_AMBULATORY_CARE_PROVIDER_SITE_OTHER): Payer: 59 | Admitting: Family Medicine

## 2023-04-08 VITALS — BP 129/84 | HR 73 | Temp 97.9°F | Ht 67.0 in | Wt 178.0 lb

## 2023-04-08 DIAGNOSIS — R632 Polyphagia: Secondary | ICD-10-CM

## 2023-04-08 DIAGNOSIS — E669 Obesity, unspecified: Secondary | ICD-10-CM

## 2023-04-08 DIAGNOSIS — Z6828 Body mass index (BMI) 28.0-28.9, adult: Secondary | ICD-10-CM

## 2023-04-08 DIAGNOSIS — E7849 Other hyperlipidemia: Secondary | ICD-10-CM

## 2023-04-08 DIAGNOSIS — Z6827 Body mass index (BMI) 27.0-27.9, adult: Secondary | ICD-10-CM | POA: Diagnosis not present

## 2023-04-08 MED ORDER — TOPIRAMATE 25 MG PO TABS
75.0000 mg | ORAL_TABLET | Freq: Every day | ORAL | 0 refills | Status: DC
Start: 2023-04-08 — End: 2023-05-07
  Filled 2023-04-08: qty 90, 30d supply, fill #0

## 2023-04-08 MED ORDER — LOMAIRA 8 MG PO TABS
12.0000 mg | ORAL_TABLET | Freq: Every day | ORAL | 0 refills | Status: DC
Start: 2023-04-08 — End: 2023-05-07
  Filled 2023-04-08: qty 45, 30d supply, fill #0

## 2023-04-08 NOTE — Progress Notes (Unsigned)
Chief Complaint:   OBESITY Robin Knapp is here to discuss her progress with her obesity treatment plan along with follow-up of her obesity related diagnoses. [name] is on keeping a food journal and adhering to recommended goals of 1200-1400 calories and 85+ grams of protein and states she is following her eating plan approximately 0% of the time. [name] states she is walking for 60 minutes 7 times per week.  Today's visit was #: 82 Starting weight: 199 lbs Starting date: 11/27/2017 Today's weight: 178 lbs Today's date: 04/08/2023 Total lbs lost to date: 21 Total lbs lost since last in-office visit: 0  Interim History: Patient went to [place] since last appointment.  She voices this included lots of eating on the go, eating when you can and making the conscious choices she could when she had the ability.   Subjective:   1. Polyphagia PDMP was checked with no concerns. Patient does not need to lose 5% due to starting at BMI <25.  2. Other hyperlipidemia Patient is on Lipitor daily. Her last LDL was 71, HDL 47, and triglycerides 50.  Assessment/Plan:   1. Polyphagia Patient will continue her medications, and we will refill Lomaira 12 mg and Topamax 75 mg for 1 month.   - topiramate (TOPAMAX) 25 MG tablet; Take 3 tablets (75 mg total) by mouth daily.  Dispense: 90 tablet; Refill: 0 - Phentermine HCl (LOMAIRA) 8 MG TABS; Take 1.5 tablets (12 mg total) by mouth daily.  Dispense: 45 tablet; Refill: 0  2. Other hyperlipidemia Patient will continue Lipitor daily.   3. BMI 28.0-28.9,adult  4. Obesity, starting BMI 33.12 [name] is currently in the action stage of change. As such, her goal is to continue with weight loss efforts. She has agreed to keeping a food journal and adhering to recommended goals of 1200-1400 calories and 85+ grams of protein daily.   Exercise goals: All adults should avoid inactivity. Some physical activity is better than none, and adults who participate in any  amount of physical activity gain some health benefits.  Behavioral modification strategies: increasing lean protein intake, meal planning and cooking strategies, keeping healthy foods in the home, planning for success, and keeping a strict food journal.  [name] has agreed to follow-up with our clinic in 4 weeks. She was informed of the importance of frequent follow-up visits to maximize her success with intensive lifestyle modifications for her multiple health conditions.   Objective:   Blood pressure 129/84, pulse 73, temperature 97.9 F (36.6 C), height 5\' 7"  (1.702 m), weight 178 lb (80.7 kg), SpO2 100%. Body mass index is 27.88 kg/m.  General: Cooperative, alert, well developed, in no acute distress. HEENT: Conjunctivae and lids unremarkable. Cardiovascular: Regular rhythm.  Lungs: Normal work of breathing. Neurologic: No focal deficits.   Lab Results  Component Value Date   CREATININE 0.86 05/14/2022   BUN 15 05/14/2022   NA 139 05/14/2022   K 4.1 05/14/2022   CL 99 05/14/2022   CO2 23 05/14/2022   Lab Results  Component Value Date   ALT 21 05/14/2022   AST 18 05/14/2022   ALKPHOS 32 (L) 05/14/2022   BILITOT 0.5 05/14/2022   Lab Results  Component Value Date   HGBA1C 5.4 05/14/2022   HGBA1C 5.5 08/08/2021   HGBA1C 5.4 02/20/2021   HGBA1C 5.8 (H) 07/19/2020   HGBA1C 5.5 04/05/2020   Lab Results  Component Value Date   INSULIN 7.9 05/14/2022   INSULIN 19.0 08/08/2021   INSULIN 7.8 02/20/2021  INSULIN 10.5 07/19/2020   INSULIN 12.7 04/05/2020   Lab Results  Component Value Date   TSH 0.667 11/27/2017   Lab Results  Component Value Date   CHOL 129 05/14/2022   HDL 47 05/14/2022   LDLCALC 71 05/14/2022   TRIG 50 05/14/2022   CHOLHDL 3.7 02/20/2021   Lab Results  Component Value Date   VD25OH 50.8 05/14/2022   VD25OH 56.4 08/08/2021   VD25OH 64.7 02/20/2021   Lab Results  Component Value Date   WBC 8.0 11/27/2017   HGB 13.6 11/27/2017   HCT  38.8 11/27/2017   MCV 86 11/27/2017   PLT 343 04/06/2015   No results found for: "IRON", "TIBC", "FERRITIN"  Attestation Statements:   Reviewed by clinician on day of visit: allergies, medications, problem list, medical history, surgical history, family history, social history, and previous encounter notes.   I, Burt Knack, am acting as transcriptionist for Reuben Likes, MD.  I have reviewed the above documentation for accuracy and completeness, and I agree with the above. - Reuben Likes, MD

## 2023-04-15 ENCOUNTER — Other Ambulatory Visit (HOSPITAL_COMMUNITY): Payer: Self-pay

## 2023-04-15 MED ORDER — TOBRAMYCIN-DEXAMETHASONE 0.3-0.1 % OP SUSP
1.0000 [drp] | Freq: Four times a day (QID) | OPHTHALMIC | 0 refills | Status: DC
  Filled 2023-04-15: qty 5, 5d supply, fill #0

## 2023-05-07 ENCOUNTER — Ambulatory Visit (INDEPENDENT_AMBULATORY_CARE_PROVIDER_SITE_OTHER): Payer: 59 | Admitting: Family Medicine

## 2023-05-07 ENCOUNTER — Encounter (INDEPENDENT_AMBULATORY_CARE_PROVIDER_SITE_OTHER): Payer: Self-pay | Admitting: Family Medicine

## 2023-05-07 ENCOUNTER — Other Ambulatory Visit: Payer: Self-pay

## 2023-05-07 ENCOUNTER — Other Ambulatory Visit (HOSPITAL_COMMUNITY): Payer: Self-pay

## 2023-05-07 VITALS — BP 131/89 | HR 75 | Temp 98.4°F | Ht 67.0 in | Wt 181.0 lb

## 2023-05-07 DIAGNOSIS — E7849 Other hyperlipidemia: Secondary | ICD-10-CM

## 2023-05-07 DIAGNOSIS — Z6828 Body mass index (BMI) 28.0-28.9, adult: Secondary | ICD-10-CM | POA: Diagnosis not present

## 2023-05-07 DIAGNOSIS — R632 Polyphagia: Secondary | ICD-10-CM | POA: Diagnosis not present

## 2023-05-07 DIAGNOSIS — E669 Obesity, unspecified: Secondary | ICD-10-CM

## 2023-05-07 MED ORDER — TOPIRAMATE 25 MG PO TABS
75.0000 mg | ORAL_TABLET | Freq: Every day | ORAL | 0 refills | Status: DC
Start: 2023-05-07 — End: 2023-06-11
  Filled 2023-05-07: qty 90, 30d supply, fill #0

## 2023-05-07 MED ORDER — LOMAIRA 8 MG PO TABS
12.0000 mg | ORAL_TABLET | Freq: Every day | ORAL | 0 refills | Status: DC
Start: 2023-05-07 — End: 2023-06-11
  Filled 2023-05-07: qty 45, 30d supply, fill #0

## 2023-05-07 NOTE — Progress Notes (Signed)
Chief Complaint:   OBESITY Robin Knapp is here to discuss her progress with her obesity treatment plan along with follow-up of her obesity related diagnoses. Robin Knapp is on keeping a food journal and adhering to recommended goals of 1200-1400 calories and 85+ grams of protein and states she is following her eating plan approximately 50% of the time. Robin Knapp states she is doing 0 minutes 0 times per week.  Today's visit was #: 83 Starting weight: 199 lbs Starting date: 11/27/2017 Today's weight: 181 lbs Today's date: 05/07/2023 Total lbs lost to date: 18 Total lbs lost since last in-office visit: 0  Interim History: Patient has been less active due to frustrations at work.    Subjective:   1. Polyphagia Patient is on combination Lomaira and Topamax.  Starting after [facility], starting weight was of 181 pounds.  2. Other hyperlipidemia Patient is on a atorvastatin.  Her last LDL was 70, HDL 47, and triglycerides 50.  Assessment/Plan:   1. Polyphagia Patient will continue her medications, and we will refill topiramate 75 mg and Lomaira 12 mg for 1 month.  - topiramate (TOPAMAX) 25 MG tablet; Take 3 tablets (75 mg total) by mouth daily.  Dispense: 90 tablet; Refill: 0 - Phentermine HCl (LOMAIRA) 8 MG TABS; Take 1&1/2 tablets (12 mg total) by mouth daily.  Dispense: 45 tablet; Refill: 0  2. Other hyperlipidemia Patient will continue atorvastatin with no change in dose.  3. BMI 28.0-28.9,adult  4. Obesity, starting BMI 33.12 Robin Knapp is currently in the action stage of change. As such, her goal is to continue with weight loss efforts. She has agreed to keeping a food journal and adhering to recommended goals of 1200-1400 calories and 85+ grams of protein daily.   Exercise goals: All adults should avoid inactivity. Some physical activity is better than none, and adults who participate in any amount of physical activity gain some health benefits.  Behavioral modification strategies:  increasing lean protein intake, meal planning and cooking strategies, keeping healthy foods in the home, and planning for success.  Robin Knapp has agreed to follow-up with our clinic in 4 weeks. She was informed of the importance of frequent follow-up visits to maximize her success with intensive lifestyle modifications for her multiple health conditions.   Objective:   Blood pressure 131/89, pulse 75, temperature 98.4 F (36.9 C), height 5\' 7"  (1.702 m), weight 181 lb (82.1 kg), SpO2 100%. Body mass index is 28.35 kg/m.  General: Cooperative, alert, well developed, in no acute distress. HEENT: Conjunctivae and lids unremarkable. Cardiovascular: Regular rhythm.  Lungs: Normal work of breathing. Neurologic: No focal deficits.   Lab Results  Component Value Date   CREATININE 0.86 05/14/2022   BUN 15 05/14/2022   NA 139 05/14/2022   K 4.1 05/14/2022   CL 99 05/14/2022   CO2 23 05/14/2022   Lab Results  Component Value Date   ALT 21 05/14/2022   AST 18 05/14/2022   ALKPHOS 32 (L) 05/14/2022   BILITOT 0.5 05/14/2022   Lab Results  Component Value Date   HGBA1C 5.4 05/14/2022   HGBA1C 5.5 08/08/2021   HGBA1C 5.4 02/20/2021   HGBA1C 5.8 (H) 07/19/2020   HGBA1C 5.5 04/05/2020   Lab Results  Component Value Date   INSULIN 7.9 05/14/2022   INSULIN 19.0 08/08/2021   INSULIN 7.8 02/20/2021   INSULIN 10.5 07/19/2020   INSULIN 12.7 04/05/2020   Lab Results  Component Value Date   TSH 0.667 11/27/2017   Lab Results  Component Value Date   CHOL 129 05/14/2022   HDL 47 05/14/2022   LDLCALC 71 05/14/2022   TRIG 50 05/14/2022   CHOLHDL 3.7 02/20/2021   Lab Results  Component Value Date   VD25OH 50.8 05/14/2022   VD25OH 56.4 08/08/2021   VD25OH 64.7 02/20/2021   Lab Results  Component Value Date   WBC 8.0 11/27/2017   HGB 13.6 11/27/2017   HCT 38.8 11/27/2017   MCV 86 11/27/2017   PLT 343 04/06/2015   No results found for: "IRON", "TIBC", "FERRITIN"  Attestation  Statements:   Reviewed by clinician on day of visit: allergies, medications, problem list, medical history, surgical history, family history, social history, and previous encounter notes.   I, Burt Knack, am acting as transcriptionist for Reuben Likes, MD.  I have reviewed the above documentation for accuracy and completeness, and I agree with the above. - Reuben Likes, MD

## 2023-05-13 ENCOUNTER — Other Ambulatory Visit (HOSPITAL_COMMUNITY): Payer: Self-pay

## 2023-05-17 ENCOUNTER — Other Ambulatory Visit: Payer: Self-pay | Admitting: Medical Genetics

## 2023-05-17 DIAGNOSIS — Z006 Encounter for examination for normal comparison and control in clinical research program: Secondary | ICD-10-CM

## 2023-06-04 DIAGNOSIS — H16223 Keratoconjunctivitis sicca, not specified as Sjogren's, bilateral: Secondary | ICD-10-CM | POA: Diagnosis not present

## 2023-06-11 ENCOUNTER — Encounter (INDEPENDENT_AMBULATORY_CARE_PROVIDER_SITE_OTHER): Payer: Self-pay | Admitting: Family Medicine

## 2023-06-11 ENCOUNTER — Other Ambulatory Visit (HOSPITAL_COMMUNITY): Payer: Self-pay

## 2023-06-11 ENCOUNTER — Ambulatory Visit (INDEPENDENT_AMBULATORY_CARE_PROVIDER_SITE_OTHER): Payer: 59 | Admitting: Family Medicine

## 2023-06-11 VITALS — BP 113/78 | HR 69 | Temp 98.2°F | Ht 67.0 in | Wt 183.0 lb

## 2023-06-11 DIAGNOSIS — R632 Polyphagia: Secondary | ICD-10-CM | POA: Diagnosis not present

## 2023-06-11 DIAGNOSIS — Z6828 Body mass index (BMI) 28.0-28.9, adult: Secondary | ICD-10-CM | POA: Diagnosis not present

## 2023-06-11 DIAGNOSIS — E669 Obesity, unspecified: Secondary | ICD-10-CM

## 2023-06-11 DIAGNOSIS — E7849 Other hyperlipidemia: Secondary | ICD-10-CM | POA: Diagnosis not present

## 2023-06-11 MED ORDER — TOPIRAMATE 25 MG PO TABS
75.0000 mg | ORAL_TABLET | Freq: Every day | ORAL | 0 refills | Status: DC
Start: 2023-06-11 — End: 2023-07-15
  Filled 2023-06-11: qty 90, 30d supply, fill #0

## 2023-06-11 MED ORDER — LOMAIRA 8 MG PO TABS
12.0000 mg | ORAL_TABLET | Freq: Every day | ORAL | 0 refills | Status: DC
Start: 2023-06-11 — End: 2023-07-15
  Filled 2023-06-11: qty 45, 30d supply, fill #0

## 2023-06-11 NOTE — Progress Notes (Signed)
Chief Complaint:   OBESITY Robin Knapp is here to discuss her progress with her obesity treatment plan along with follow-up of her obesity related diagnoses. Robin Knapp is on keeping a food journal and adhering to recommended goals of 1200-1400 calories and 85+ grams of protein and states she is following her eating plan approximately 60% of the time. Robin Knapp states she is doing 0 minutes 0 times per week.  Today's visit was #: 84 Starting weight: 199 lbs Starting date: 11/27/2017 Today's weight: 183 lbs Today's date: 06/11/2023 Total lbs lost to date: 16 Total lbs lost since last in-office visit: 0  Interim History: Patient here for follow up. She has been working more and stress eating more than usual.  She went to the grocery store today to get herself back on plan.  She is going to put beans and quite a bit of vegetables in it. Work has been very busy recently. She has been doing journaling a few days a week and then stops.  She feels the journaling is more work for her.  She wants to stick with the journaling for a bit longer before going back to the structure of a category plan.   Subjective:   1. Polyphagia Patient is on combination Lomaira and topiramate.  She does not need to obtain 5% weight loss.  She was previously on Ozempic.  2. Other hyperlipidemia Patient's previous LDL was 71, HDL is 47, and triglycerides 50.  She is on Lipitor.  Assessment/Plan:   1. Polyphagia We will refill Lomaira 12 mg and topiramate 75 mg for 1 month.  - Phentermine HCl (LOMAIRA) 8 MG TABS; Take 1&1/2 tablets (12 mg total) by mouth daily.  Dispense: 45 tablet; Refill: 0 - topiramate (TOPAMAX) 25 MG tablet; Take 3 tablets (75 mg total) by mouth daily.  Dispense: 90 tablet; Refill: 0  2. Other hyperlipidemia Patient will continue Lipitor with no change in medication or dose.  3. BMI 28.0-28.9,adult  4. Obesity with starting BMI of 33.12 Robin Knapp is currently in the action stage of change. As  such, her goal is to continue with weight loss efforts. She has agreed to keeping a food journal and adhering to recommended goals of 1200-1400 calories and 85+ grams of protein daily.   Exercise goals: All adults should avoid inactivity. Some physical activity is better than none, and adults who participate in any amount of physical activity gain some health benefits.  Behavioral modification strategies: increasing lean protein intake, meal planning and cooking strategies, keeping healthy foods in the home, and planning for success.  Robin Knapp has agreed to follow-up with our clinic in 4 weeks. She was informed of the importance of frequent follow-up visits to maximize her success with intensive lifestyle modifications for her multiple health conditions.   Objective:   Blood pressure 113/78, pulse 69, temperature 98.2 F (36.8 C), height 5\' 7"  (1.702 m), weight 183 lb (83 kg), SpO2 100%. Body mass index is 28.66 kg/m.  General: Cooperative, alert, well developed, in no acute distress. HEENT: Conjunctivae and lids unremarkable. Cardiovascular: Regular rhythm.  Lungs: Normal work of breathing. Neurologic: No focal deficits.   Lab Results  Component Value Date   CREATININE 0.86 05/14/2022   BUN 15 05/14/2022   NA 139 05/14/2022   K 4.1 05/14/2022   CL 99 05/14/2022   CO2 23 05/14/2022   Lab Results  Component Value Date   ALT 21 05/14/2022   AST 18 05/14/2022   ALKPHOS 32 (L) 05/14/2022  BILITOT 0.5 05/14/2022   Lab Results  Component Value Date   HGBA1C 5.4 05/14/2022   HGBA1C 5.5 08/08/2021   HGBA1C 5.4 02/20/2021   HGBA1C 5.8 (H) 07/19/2020   HGBA1C 5.5 04/05/2020   Lab Results  Component Value Date   INSULIN 7.9 05/14/2022   INSULIN 19.0 08/08/2021   INSULIN 7.8 02/20/2021   INSULIN 10.5 07/19/2020   INSULIN 12.7 04/05/2020   Lab Results  Component Value Date   TSH 0.667 11/27/2017   Lab Results  Component Value Date   CHOL 129 05/14/2022   HDL 47  05/14/2022   LDLCALC 71 05/14/2022   TRIG 50 05/14/2022   CHOLHDL 3.7 02/20/2021   Lab Results  Component Value Date   VD25OH 50.8 05/14/2022   VD25OH 56.4 08/08/2021   VD25OH 64.7 02/20/2021   Lab Results  Component Value Date   WBC 8.0 11/27/2017   HGB 13.6 11/27/2017   HCT 38.8 11/27/2017   MCV 86 11/27/2017   PLT 343 04/06/2015   No results found for: "IRON", "TIBC", "FERRITIN"  Attestation Statements:   Reviewed by clinician on day of visit: allergies, medications, problem list, medical history, surgical history, family history, social history, and previous encounter notes.    I, Burt Knack, am acting as transcriptionist for Reuben Likes, MD.  I have reviewed the above documentation for accuracy and completeness, and I agree with the above. - Reuben Likes, MD

## 2023-06-28 ENCOUNTER — Other Ambulatory Visit (HOSPITAL_COMMUNITY): Payer: Self-pay

## 2023-06-28 MED ORDER — INFLUENZA VIRUS VACC SPLIT PF (FLUZONE) 0.5 ML IM SUSY
0.5000 mL | PREFILLED_SYRINGE | Freq: Once | INTRAMUSCULAR | 0 refills | Status: AC
  Filled 2023-06-28: qty 0.5, 1d supply, fill #0

## 2023-07-03 ENCOUNTER — Other Ambulatory Visit (HOSPITAL_COMMUNITY): Payer: Self-pay

## 2023-07-08 ENCOUNTER — Other Ambulatory Visit: Payer: Self-pay

## 2023-07-08 ENCOUNTER — Other Ambulatory Visit (HOSPITAL_COMMUNITY): Payer: Self-pay

## 2023-07-08 ENCOUNTER — Ambulatory Visit (INDEPENDENT_AMBULATORY_CARE_PROVIDER_SITE_OTHER): Payer: 59 | Admitting: Family Medicine

## 2023-07-08 DIAGNOSIS — E7800 Pure hypercholesterolemia, unspecified: Secondary | ICD-10-CM | POA: Diagnosis not present

## 2023-07-08 DIAGNOSIS — Z23 Encounter for immunization: Secondary | ICD-10-CM | POA: Diagnosis not present

## 2023-07-08 DIAGNOSIS — E66811 Obesity, class 1: Secondary | ICD-10-CM | POA: Diagnosis not present

## 2023-07-08 DIAGNOSIS — N951 Menopausal and female climacteric states: Secondary | ICD-10-CM | POA: Diagnosis not present

## 2023-07-08 MED ORDER — ATORVASTATIN CALCIUM 10 MG PO TABS
10.0000 mg | ORAL_TABLET | Freq: Every day | ORAL | 1 refills | Status: DC
  Filled 2023-07-08 – 2023-07-22 (×2): qty 90, 90d supply, fill #0

## 2023-07-15 ENCOUNTER — Encounter (INDEPENDENT_AMBULATORY_CARE_PROVIDER_SITE_OTHER): Payer: Self-pay | Admitting: Family Medicine

## 2023-07-15 ENCOUNTER — Ambulatory Visit (INDEPENDENT_AMBULATORY_CARE_PROVIDER_SITE_OTHER): Payer: 59 | Admitting: Family Medicine

## 2023-07-15 ENCOUNTER — Other Ambulatory Visit (HOSPITAL_COMMUNITY): Payer: Self-pay

## 2023-07-15 VITALS — BP 138/77 | HR 87 | Temp 98.4°F | Ht 65.0 in | Wt 189.0 lb

## 2023-07-15 DIAGNOSIS — Z6831 Body mass index (BMI) 31.0-31.9, adult: Secondary | ICD-10-CM | POA: Diagnosis not present

## 2023-07-15 DIAGNOSIS — R632 Polyphagia: Secondary | ICD-10-CM

## 2023-07-15 DIAGNOSIS — E7849 Other hyperlipidemia: Secondary | ICD-10-CM

## 2023-07-15 DIAGNOSIS — Z6833 Body mass index (BMI) 33.0-33.9, adult: Secondary | ICD-10-CM

## 2023-07-15 DIAGNOSIS — E669 Obesity, unspecified: Secondary | ICD-10-CM

## 2023-07-15 MED ORDER — PHENTERMINE HCL 15 MG PO CAPS
15.0000 mg | ORAL_CAPSULE | Freq: Every day | ORAL | 0 refills | Status: DC
Start: 2023-07-15 — End: 2023-08-28
  Filled 2023-07-15: qty 30, 30d supply, fill #0

## 2023-07-15 MED ORDER — TOPIRAMATE 100 MG PO TABS
100.0000 mg | ORAL_TABLET | Freq: Every day | ORAL | 0 refills | Status: DC
Start: 2023-07-15 — End: 2023-08-28
  Filled 2023-07-15: qty 30, 30d supply, fill #0

## 2023-07-15 NOTE — Progress Notes (Unsigned)
Chief Complaint:   OBESITY Robin Knapp is here to discuss her progress with her obesity treatment plan along with follow-up of her obesity related diagnoses. Robin Knapp is on keeping a food journal and adhering to recommended goals of 1200-1400 calories and 85+ grams of protein and states she is following her eating plan approximately 75% of the time. Robin Knapp states she is doing cardio for 60 minutes 2 times per week.  Today's visit was #: 85 Starting weight: 199 lbs Starting date: 11/27/2017 Today's weight: 189 lbs Today's date: 07/15/2023 Total lbs lost to date: 10 Total lbs lost since last in-office visit: 0  Interim History: Patient has been working and staying busy over the last month.  She celebrated her aunt's 90th birthday. She has been trying to stay mindful and consistent with food journaling. Patient on combo lomaira and topiramate with no side effects.   Subjective:   1. Polyphagia Patient is on combination phentermine and topiramate.  Her starting weight was of 173 pounds but she did not have to achieve the 5% as previously on Ozempic.  2. Other hyperlipidemia Patient is on Lipitor 10 mg daily.  She denies myalgias or transaminitis.  Assessment/Plan:   1. Polyphagia Patient agreed to increase phentermine to 15 mg daily, and increase topiramate to 100 mg daily, and we will refill both for 1 month.  - phentermine 15 MG capsule; Take 1 capsule (15 mg total) by mouth daily.  Dispense: 30 capsule; Refill: 0 - topiramate (TOPAMAX) 100 MG tablet; Take 1 tablet (100 mg total) by mouth daily.  Dispense: 30 tablet; Refill: 0  2. Other hyperlipidemia Patient will continue Lipitor daily; she will have repeat labs done with her PCP.  3. BMI 31.0-31.9,adult  4. Obesity with starting BMI of 33.12 Robin Knapp is currently in the action stage of change. As such, her goal is to continue with weight loss efforts. She has agreed to keeping a food journal and adhering to recommended goals of  1300-1400 calories and 85+ grams of protein daily.   Exercise goals: All adults should avoid inactivity. Some physical activity is better than none, and adults who participate in any amount of physical activity gain some health benefits.  Behavioral modification strategies: increasing lean protein intake, meal planning and cooking strategies, keeping healthy foods in the home, and planning for success.  Robin Knapp has agreed to follow-up with our clinic in 4 weeks. She was informed of the importance of frequent follow-up visits to maximize her success with intensive lifestyle modifications for her multiple health conditions.   Objective:   Blood pressure 138/77, pulse 87, temperature 98.4 F (36.9 C), height 5\' 5"  (1.651 m), weight 189 lb (85.7 kg), SpO2 100%. Body mass index is 31.45 kg/m.  General: Cooperative, alert, well developed, in no acute distress. HEENT: Conjunctivae and lids unremarkable. Cardiovascular: Regular rhythm.  Lungs: Normal work of breathing. Neurologic: No focal deficits.   Lab Results  Component Value Date   CREATININE 0.86 05/14/2022   BUN 15 05/14/2022   NA 139 05/14/2022   K 4.1 05/14/2022   CL 99 05/14/2022   CO2 23 05/14/2022   Lab Results  Component Value Date   ALT 21 05/14/2022   AST 18 05/14/2022   ALKPHOS 32 (L) 05/14/2022   BILITOT 0.5 05/14/2022   Lab Results  Component Value Date   HGBA1C 5.4 05/14/2022   HGBA1C 5.5 08/08/2021   HGBA1C 5.4 02/20/2021   HGBA1C 5.8 (H) 07/19/2020   HGBA1C 5.5 04/05/2020  Lab Results  Component Value Date   INSULIN 7.9 05/14/2022   INSULIN 19.0 08/08/2021   INSULIN 7.8 02/20/2021   INSULIN 10.5 07/19/2020   INSULIN 12.7 04/05/2020   Lab Results  Component Value Date   TSH 0.667 11/27/2017   Lab Results  Component Value Date   CHOL 129 05/14/2022   HDL 47 05/14/2022   LDLCALC 71 05/14/2022   TRIG 50 05/14/2022   CHOLHDL 3.7 02/20/2021   Lab Results  Component Value Date   VD25OH 50.8  05/14/2022   VD25OH 56.4 08/08/2021   VD25OH 64.7 02/20/2021   Lab Results  Component Value Date   WBC 8.0 11/27/2017   HGB 13.6 11/27/2017   HCT 38.8 11/27/2017   MCV 86 11/27/2017   PLT 343 04/06/2015   No results found for: "IRON", "TIBC", "FERRITIN"  Attestation Statements:   Reviewed by clinician on day of visit: allergies, medications, problem list, medical history, surgical history, family history, social history, and previous encounter notes.   I, Burt Knack, am acting as transcriptionist for Reuben Likes, MD.  I have reviewed the above documentation for accuracy and completeness, and I agree with the above. - Reuben Likes, MD

## 2023-07-18 ENCOUNTER — Other Ambulatory Visit (HOSPITAL_COMMUNITY): Payer: Self-pay

## 2023-07-22 ENCOUNTER — Other Ambulatory Visit (HOSPITAL_COMMUNITY): Payer: Self-pay

## 2023-08-14 ENCOUNTER — Ambulatory Visit (INDEPENDENT_AMBULATORY_CARE_PROVIDER_SITE_OTHER): Payer: 59 | Admitting: Family Medicine

## 2023-08-28 ENCOUNTER — Other Ambulatory Visit: Payer: Self-pay

## 2023-08-28 ENCOUNTER — Ambulatory Visit (INDEPENDENT_AMBULATORY_CARE_PROVIDER_SITE_OTHER): Payer: 59 | Admitting: Family Medicine

## 2023-08-28 ENCOUNTER — Other Ambulatory Visit (HOSPITAL_COMMUNITY): Payer: Self-pay

## 2023-08-28 VITALS — BP 130/81 | HR 78 | Temp 98.2°F | Ht 65.0 in | Wt 195.0 lb

## 2023-08-28 DIAGNOSIS — E66811 Obesity, class 1: Secondary | ICD-10-CM | POA: Diagnosis not present

## 2023-08-28 DIAGNOSIS — Z6832 Body mass index (BMI) 32.0-32.9, adult: Secondary | ICD-10-CM

## 2023-08-28 DIAGNOSIS — E559 Vitamin D deficiency, unspecified: Secondary | ICD-10-CM | POA: Diagnosis not present

## 2023-08-28 DIAGNOSIS — R632 Polyphagia: Secondary | ICD-10-CM

## 2023-08-28 MED ORDER — TOPIRAMATE 100 MG PO TABS
100.0000 mg | ORAL_TABLET | Freq: Every day | ORAL | 0 refills | Status: DC
  Filled 2023-08-28: qty 30, 30d supply, fill #0

## 2023-08-28 MED ORDER — PHENTERMINE HCL 15 MG PO CAPS
15.0000 mg | ORAL_CAPSULE | Freq: Every day | ORAL | 0 refills | Status: DC
Start: 2023-08-28 — End: 2023-10-09
  Filled 2023-08-28: qty 30, 30d supply, fill #0

## 2023-08-28 NOTE — Progress Notes (Signed)
   SUBJECTIVE:  Chief Complaint: Obesity  Interim History: Since last appointment patient established care with a new PCP.  Patient has been just getting through the last month and half.  She feels like she has really struggled with remembering to take her medications and is being mindful of eating and food choices. She hasn't been journaling because that it is another thing she has to do.  She has been working extra.  She is trying to survive the next month until January.  She takes her medications when she remembers.  Jeannene is here to discuss her progress with her obesity treatment plan. She is on the keeping a food journal and adhering to recommended goals of 1300-1400 calories and 85+ grams of protein and states she is following her eating plan approximately 50 % of the time. She states she is not exercising.  OBJECTIVE: Visit Diagnoses: Problem List Items Addressed This Visit       Other   Vitamin D deficiency   Previously on Vitamin D weekly.  She was encouraged to take an OTC Vitamin D daily.  Last Vitamin D level of 50.  No nausea, vomiting or muscle weakness. Follow up on labs done with PCP.      Polyphagia - Primary   Previously on GLP medication with significant loss on scale and control of food intake daily.  She is noticing less focus on keeping food intake at macro goal and an increase in food noise.  PDMP checked today- no concerns noted.  Will refill phentermine at 15mg  dose and topiramate at 100mg  daily.  Weight loss does not need to be 5% as patient had BMI that was WNL when on GLP      Relevant Medications   phentermine 15 MG capsule   topiramate (TOPAMAX) 100 MG tablet   Other Visit Diagnoses       BMI 32.0-32.9,adult         Obesity with starting BMI of 33.12       Relevant Medications   phentermine 15 MG capsule       No data recorded  No data recorded  No data recorded  No data recorded    ASSESSMENT AND PLAN:  Diet: Melaine is currently in  the action stage of change. As such, her goal is to continue with weight loss efforts. She has agreed to practicing portion control and making smarter food choices, such as increasing vegetables and decreasing simple carbohydrates.  Exercise: Daniela has been instructed that some exercise is better than none for weight loss and overall health benefits.   Behavior Modification:  We discussed the following Behavioral Modification Strategies today: increasing lean protein intake, increasing vegetables, meal planning and cooking strategies, better snacking choices, emotional eating strategies , and holiday eating strategies. We discussed various medication options to help Apollonia with her weight loss efforts and we both agreed to continue current dose of phentermine and topiramate at 15mg  and 100mg .  Return in about 4 weeks (around 09/25/2023) for repeat IC.[name] She was informed of the importance of frequent follow up visits to maximize her success with intensive lifestyle modifications for her multiple health conditions.  Attestation Statements:   Reviewed by clinician on day of visit: allergies, medications, problem list, medical history, surgical history, family history, social history, and previous encounter notes.   Reuben Likes, MD

## 2023-09-07 DIAGNOSIS — R632 Polyphagia: Secondary | ICD-10-CM | POA: Insufficient documentation

## 2023-09-07 NOTE — Assessment & Plan Note (Signed)
Previously on GLP medication with significant loss on scale and control of food intake daily.  She is noticing less focus on keeping food intake at macro goal and an increase in food noise.  PDMP checked today- no concerns noted.  Will refill phentermine at 15mg  dose and topiramate at 100mg  daily.  Weight loss does not need to be 5% as patient had BMI that was WNL when on GLP

## 2023-09-07 NOTE — Assessment & Plan Note (Signed)
Previously on Vitamin D weekly.  She was encouraged to take an OTC Vitamin D daily.  Last Vitamin D level of 50.  No nausea, vomiting or muscle weakness. Follow up on labs done with PCP.

## 2023-10-08 ENCOUNTER — Encounter (INDEPENDENT_AMBULATORY_CARE_PROVIDER_SITE_OTHER): Payer: Self-pay | Admitting: Family Medicine

## 2023-10-08 ENCOUNTER — Ambulatory Visit (INDEPENDENT_AMBULATORY_CARE_PROVIDER_SITE_OTHER): Payer: Commercial Managed Care - PPO | Admitting: Family Medicine

## 2023-10-08 VITALS — BP 144/86 | HR 81 | Temp 97.9°F | Ht 65.0 in | Wt 199.0 lb

## 2023-10-08 DIAGNOSIS — R632 Polyphagia: Secondary | ICD-10-CM

## 2023-10-08 DIAGNOSIS — E66811 Obesity, class 1: Secondary | ICD-10-CM | POA: Insufficient documentation

## 2023-10-08 DIAGNOSIS — R0602 Shortness of breath: Secondary | ICD-10-CM | POA: Diagnosis not present

## 2023-10-08 DIAGNOSIS — Z6833 Body mass index (BMI) 33.0-33.9, adult: Secondary | ICD-10-CM | POA: Diagnosis not present

## 2023-10-08 DIAGNOSIS — R5383 Other fatigue: Secondary | ICD-10-CM | POA: Diagnosis not present

## 2023-10-08 DIAGNOSIS — E669 Obesity, unspecified: Secondary | ICD-10-CM | POA: Diagnosis not present

## 2023-10-08 DIAGNOSIS — Z6832 Body mass index (BMI) 32.0-32.9, adult: Secondary | ICD-10-CM

## 2023-10-08 NOTE — Assessment & Plan Note (Signed)
Starting weight: 199 on 11/27/2017 Peak weight:  BMR: 1670 Previous obesity management: patient has been on GLP-1 (Ozempic with significant weight loss but loss of coverage), metformin, combination phentermine and topiramate at max doses without stopping weight regain.   Body Fat %: 38.1 Starting Meal Plan: Category 3 Meal Plan needs: easy ready options, snack options Likes/ Dislikes of meal plan: n/a

## 2023-10-08 NOTE — Progress Notes (Unsigned)
   SUBJECTIVE:  Chief Complaint: Obesity  Interim History: Patient has struggled with meal plan over the last few weeks with the holidays and increased frequency of work.   Robin Knapp is here to discuss her progress with her obesity treatment plan. She is on the practicing portion control and making smarter food choices, such as increasing vegetables and decreasing simple carbohydrates and states she is following her eating plan approximately 50 % of the time. She states she is not exercising.   OBJECTIVE: Visit Diagnoses: Problem List Items Addressed This Visit       Other   Other fatigue   Increased fatigue recently.  Last home sleep study done in 2022 showing mild sleep apnea but voices that her fatigue has increased substantially.  She is thinking her sleep apnea has worsened.  Will seek re-evaluation.      Relevant Orders   Ambulatory referral to Neurology   Obesity (BMI 30.0-34.9)   Starting weight: 199 on 11/27/2017 Peak weight:  BMR: 1670 Previous obesity management: patient has been on GLP-1 (Ozempic with significant weight loss but loss of coverage), metformin, combination phentermine and topiramate at max doses without stopping weight regain.   Body Fat %: 38.1 Starting Meal Plan: Category 3 Meal Plan needs: easy ready options, snack options Knapp/ Dislikes of meal plan: n/a       Other Visit Diagnoses       SOBOE (shortness of breath on exertion)    -  Primary     Obesity with starting BMI of 33.12         BMI 32.0-32.9,adult           Vitals Temp: 97.9 F (36.6 C) BP: (!) 144/86 Pulse Rate: 81 SpO2: 100 %   Anthropometric Measurements Height: 5\' 5"  (1.651 m) Weight: 199 lb (90.3 kg) BMI (Calculated): 33.12 Weight at Last Visit: 195 lb Weight Lost Since Last Visit: 0 Weight Gained Since Last Visit: 4 lb Starting Weight: 199 lb Total Weight Loss (lbs): 0 lb (0 kg)   Body Composition  Body Fat %: 38.1 % Fat Mass (lbs): 76.2 lbs Muscle Mass  (lbs): 117.4 lbs Total Body Water (lbs): 76 lbs Visceral Fat Rating : 10   Other Clinical Data RMR: 1670 Fasting: yes Labs: yes Today's Visit #: 61 Starting Date: 11/27/17     ASSESSMENT AND PLAN:  Diet: Clarity {CHL AMB IS/IS NOT:210130109} currently in the action stage of change. As such, her goal is to {HWW Weight Loss Efforts:210964006}. She {HAS HAS WUJ:81191} agreed to {HWW Weight Loss Plan:210964005}.  Exercise: Robin Knapp has been instructed {HWW Exercise:210964007} for weight loss and overall health benefits.   Behavior Modification:  We discussed the following Behavioral Modification Strategies today: {HWW Behavior Modification:210964008}. We discussed various medication options to help Robin Knapp with her weight loss efforts and we both agreed to ***.  No follow-ups on file.[name] She was informed of the importance of frequent follow up visits to maximize her success with intensive lifestyle modifications for her multiple health conditions.  Attestation Statements:   Reviewed by clinician on day of visit: allergies, medications, problem list, medical history, surgical history, family history, social history, and previous encounter notes.   Time spent on visit including pre-visit chart review and post-visit care and charting was *** minutes.   Robin Likes, MD

## 2023-10-08 NOTE — Assessment & Plan Note (Signed)
Increased fatigue recently.  Last home sleep study done in 2022 showing mild sleep apnea but voices that her fatigue has increased substantially.  She is thinking her sleep apnea has worsened.  Will seek re-evaluation.

## 2023-10-09 ENCOUNTER — Other Ambulatory Visit (HOSPITAL_COMMUNITY): Payer: Self-pay

## 2023-10-09 MED ORDER — TOPIRAMATE 100 MG PO TABS
100.0000 mg | ORAL_TABLET | Freq: Every day | ORAL | 0 refills | Status: DC
  Filled 2023-10-09: qty 30, 30d supply, fill #0

## 2023-10-09 MED ORDER — PHENTERMINE HCL 15 MG PO CAPS
15.0000 mg | ORAL_CAPSULE | Freq: Every day | ORAL | 0 refills | Status: DC
Start: 2023-10-09 — End: 2023-10-29
  Filled 2023-10-09: qty 30, 30d supply, fill #0

## 2023-10-09 NOTE — Assessment & Plan Note (Signed)
Previously on GLP medication with significant loss on scale and control of food intake daily.  She is noticing less focus on keeping food intake at macro goal and an increase in food noise.  PDMP checked today- no concerns noted.  Will refill phentermine at 15mg  dose and topiramate at 100mg  daily.  Ultimate goal of medication therapy at this time is to stall weight regain as patient has come off GLP-1 analog.  She is not noticing much change in her appetite or desire to eat consistently.  Weight is almost back to starting weight from 4 years ago.  We have discussed other medication options but due to cost and lack of insurance coverage patient is not able to get back onto GLP-1 medications at this time.

## 2023-10-09 NOTE — Assessment & Plan Note (Signed)
Initial RMR by indirect calorimetry of 1752 on July 19, 2020.  Repeat RMR by indirect calorimetry was performed today due to consistent symptoms that have not improved over time.  RMR of 1670 today.  Patient has struggled with weight loss maintenance since coming off of GLP-1 analog medication.  She also given her schedule struggles with consistency of physical activity.  Shortness of breath is likely multifactorial

## 2023-10-29 ENCOUNTER — Telehealth (INDEPENDENT_AMBULATORY_CARE_PROVIDER_SITE_OTHER): Payer: Commercial Managed Care - PPO | Admitting: Family Medicine

## 2023-10-29 ENCOUNTER — Encounter (INDEPENDENT_AMBULATORY_CARE_PROVIDER_SITE_OTHER): Payer: Self-pay | Admitting: Family Medicine

## 2023-10-29 ENCOUNTER — Other Ambulatory Visit (HOSPITAL_COMMUNITY): Payer: Self-pay

## 2023-10-29 DIAGNOSIS — E669 Obesity, unspecified: Secondary | ICD-10-CM

## 2023-10-29 DIAGNOSIS — Z6833 Body mass index (BMI) 33.0-33.9, adult: Secondary | ICD-10-CM

## 2023-10-29 DIAGNOSIS — R632 Polyphagia: Secondary | ICD-10-CM

## 2023-10-29 DIAGNOSIS — Z6834 Body mass index (BMI) 34.0-34.9, adult: Secondary | ICD-10-CM

## 2023-10-29 DIAGNOSIS — E66811 Obesity, class 1: Secondary | ICD-10-CM

## 2023-10-29 MED ORDER — TOPIRAMATE 100 MG PO TABS
100.0000 mg | ORAL_TABLET | Freq: Every day | ORAL | 0 refills | Status: DC
  Filled 2023-10-29: qty 30, 30d supply, fill #0

## 2023-10-29 MED ORDER — PHENTERMINE HCL 15 MG PO CAPS
15.0000 mg | ORAL_CAPSULE | Freq: Every day | ORAL | 0 refills | Status: DC
  Filled 2023-10-29: qty 30, 30d supply, fill #0

## 2023-10-29 NOTE — Progress Notes (Signed)
     SUBJECTIVE:  Chief Complaint: Obesity  Interim History: Patient went to [employer] and picked up protein rich snacks and has been consistent with her physical activity and ensuring she is getting in strength training twice a week.  She is enjoying her new snacks.  She is getting in 1200-1300 calories a day and getting between 95-100g of protein daily.  She has been tracking before she eats her meals and is tracking her water as well.  States she followed the meal plan 80% of the time. She is currently feeling ill with URI type illness.  Briauna is here to discuss her progress with her obesity treatment plan. She is on the keeping a food journal and adhering to recommended goals of 1200 calories and 85 protein and states she is following her eating plan approximately 50 % of the time. She states she is not exercising.   OBJECTIVE: Visit Diagnoses: Problem List Items Addressed This Visit       Other   Polyphagia   Relevant Medications   phentermine  15 MG capsule   topiramate  (TOPAMAX ) 100 MG tablet    Vitals BP: (!) 147/93 Pulse Rate: 99   Anthropometric Measurements Height: 5' 5 (1.651 m) Weight: 209 lb (94.8 kg) BMI (Calculated): 34.78   No data recorded No data recorded   ASSESSMENT AND PLAN: Assessment & Plan Polyphagia Tolerating prescriptions of phentermine  and topiramate  but not experiencing same effects of satiety as she did on GLP1.  She denies any side effects such as palpitations, taste changes, neuropathy or neuralgias.  PDMP checked- no concerns.  Refill at current dose. Obesity with starting BMI of 33.12  Obesity (BMI 30.0-34.9)    Diet: Gerald is currently in the action stage of change. As such, her goal is to continue with weight loss efforts and has agreed to keeping a food journal and adhering to recommended goals of 1200-1400 calories and 85 or more grams of protein.   Exercise:  For substantial health benefits, adults should do at least 150  minutes (2 hours and 30 minutes) a week of moderate-intensity, or 75 minutes (1 hour and 15 minutes) a week of vigorous-intensity aerobic physical activity, or an equivalent combination of moderate- and vigorous-intensity aerobic activity. Aerobic activity should be performed in episodes of at least 10 minutes, and preferably, it should be spread throughout the week.  Behavior Modification:  We discussed the following Behavioral Modification Strategies today: increasing lean protein intake, increasing vegetables, no skipping meals, meal planning and cooking strategies, and keep a strict food journal. We discussed various medication options to help Darlean with her weight loss efforts and we both agreed to continue current medications of phentermine  and topiramate  at same dosage..  No follow-ups on file.SABRA She was informed of the importance of frequent follow up visits to maximize her success with intensive lifestyle modifications for her multiple health conditions.  Attestation Statements:   Reviewed by clinician on day of visit: allergies, medications, problem list, medical history, surgical history, family history, social history, and previous encounter notes.   Adelita Cho, MD

## 2023-10-31 ENCOUNTER — Other Ambulatory Visit: Payer: Self-pay | Admitting: Certified Nurse Midwife

## 2023-10-31 ENCOUNTER — Other Ambulatory Visit (HOSPITAL_COMMUNITY): Payer: Self-pay

## 2023-10-31 MED ORDER — PREDNISONE 20 MG PO TABS
60.0000 mg | ORAL_TABLET | Freq: Every day | ORAL | 0 refills | Status: DC
  Filled 2023-10-31: qty 18, 6d supply, fill #0

## 2023-10-31 MED ORDER — HYDROCOD POLI-CHLORPHE POLI ER 10-8 MG/5ML PO SUER
5.0000 mL | Freq: Two times a day (BID) | ORAL | Status: DC | PRN
Start: 2023-10-31 — End: 2023-11-01

## 2023-11-01 ENCOUNTER — Encounter: Payer: Self-pay | Admitting: Certified Nurse Midwife

## 2023-11-01 ENCOUNTER — Other Ambulatory Visit (HOSPITAL_COMMUNITY): Payer: Self-pay

## 2023-11-01 MED ORDER — HYDROCOD POLI-CHLORPHE POLI ER 10-8 MG/5ML PO SUER: 5.0000 mL | Freq: Two times a day (BID) | ORAL | Status: DC | PRN

## 2023-11-01 MED ORDER — GUAIFENESIN-CODEINE 100-10 MG/5ML PO SOLN: 5.0000 mL | ORAL | 0 refills | Status: AC | PRN

## 2023-11-01 MED ORDER — HYDROCOD POLI-CHLORPHE POLI ER 10-8 MG/5ML PO SUER
5.0000 mL | Freq: Two times a day (BID) | ORAL | 0 refills | Status: DC | PRN
  Filled 2023-11-01: qty 70, 7d supply, fill #0

## 2023-11-05 ENCOUNTER — Other Ambulatory Visit (HOSPITAL_COMMUNITY): Payer: Self-pay

## 2023-11-05 DIAGNOSIS — D241 Benign neoplasm of right breast: Secondary | ICD-10-CM | POA: Diagnosis not present

## 2023-11-05 DIAGNOSIS — Z30431 Encounter for routine checking of intrauterine contraceptive device: Secondary | ICD-10-CM | POA: Diagnosis not present

## 2023-11-05 DIAGNOSIS — Z01419 Encounter for gynecological examination (general) (routine) without abnormal findings: Secondary | ICD-10-CM | POA: Diagnosis not present

## 2023-11-05 DIAGNOSIS — B3731 Acute candidiasis of vulva and vagina: Secondary | ICD-10-CM | POA: Diagnosis not present

## 2023-11-05 MED ORDER — FLUCONAZOLE 150 MG PO TABS
150.0000 mg | ORAL_TABLET | Freq: Every day | ORAL | 2 refills | Status: DC
  Filled 2023-11-05: qty 1, 1d supply, fill #0

## 2023-11-06 ENCOUNTER — Other Ambulatory Visit: Payer: Commercial Managed Care - PPO

## 2023-11-06 DIAGNOSIS — Z006 Encounter for examination for normal comparison and control in clinical research program: Secondary | ICD-10-CM

## 2023-11-12 ENCOUNTER — Ambulatory Visit: Payer: Commercial Managed Care - PPO | Admitting: Neurology

## 2023-11-12 ENCOUNTER — Encounter: Payer: Self-pay | Admitting: Neurology

## 2023-11-12 VITALS — BP 147/93 | HR 99 | Ht 65.0 in | Wt 209.0 lb

## 2023-11-12 DIAGNOSIS — R519 Headache, unspecified: Secondary | ICD-10-CM | POA: Diagnosis not present

## 2023-11-12 DIAGNOSIS — G4719 Other hypersomnia: Secondary | ICD-10-CM

## 2023-11-12 DIAGNOSIS — G4733 Obstructive sleep apnea (adult) (pediatric): Secondary | ICD-10-CM

## 2023-11-12 DIAGNOSIS — R635 Abnormal weight gain: Secondary | ICD-10-CM | POA: Diagnosis not present

## 2023-11-12 DIAGNOSIS — E66811 Obesity, class 1: Secondary | ICD-10-CM

## 2023-11-12 NOTE — Progress Notes (Signed)
 Subjective:    Patient ID: Robin Knapp is a 53 y.o. female.  HPI    Huston Foley, MD, PhD [facility] [address], [address] [address] [place], [place] [address]  Dear Dr. Lawson Radar,   I saw your patient, Robin Knapp, upon your kind request in my sleep clinic today for initial consultation of her sleep disorder, in particular, concern for underlying obstructive sleep apnea.  The patient is unaccompanied today.  As you know, Robin Knapp is a 53 year old female with an underlying medical history of hypertension, hyperlipidemia, allergies, asthma, reflux disease, PCOS, vitamin D deficiency, palpitations, and obesity, who reports snoring and excessive daytime somnolence as well as a prior diagnosis of mild obstructive sleep apnea.  Her Epworth sleepiness score is 18 out of 24, fatigue severity score is 40 out of 63.  I reviewed your office note from 10/08/2023.  Of note, she had a home sleep test in 2022 reviewed with her test results.  She had testing through [facility] sleep lab with a home sleep test on 03/02/2021.  Total AHI was 8.6/h, O2 nadir 85%.  She has not been on PAP therapy.   She would not consider PAP therapy.  She has been on treatment with an oral appliance since 2022 through Dr. Ether Griffins office.  Uses her oral appliance every night.  She feels that her sleep apnea has become worse over time.  I do not have any records from Dr. Alveda Reasons office for review at this time.  Patient endorses that she had subsequent home sleep testing after receiving her oral appliance.  She would like to get reevaluated for her sleep apnea through our office. She denies nightly nocturia.  She has a history of morning headaches but these have improved.  She is not aware of any family history of sleep apnea.  She works full-time as a Archivist.  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  Bedtime is generally between 11 and midnight and rise time around 6 AM.  Previously testing was through  [facility] from what I can see in the chart.  She has some trouble going back to sleep.  She does not drink caffeine daily.  She drinks alcohol rarely.  She is a non-smoker.  Her Past Medical History Is Significant For: Past Medical History:  Diagnosis Date   Allergy    Asthma    Breast lump    Constipation    Dry skin    Fatigue    GERD (gastroesophageal reflux disease)    pregnancy related-zantac   HTN (hypertension)    Hyperlipidemia    Infertility, female    Joint pain    Lactose intolerance    Missed abortion    6weeks 3days   Muscle pain    Obesity    Palpitations    PCOS (polycystic ovarian syndrome)    Seasonal allergies    Skin rash    Stress    Vitamin D deficiency     Her Past Surgical History Is Significant For: Past Surgical History:  Procedure Laterality Date   BREAST BIOPSY     CESAREAN SECTION     DILATION AND EVACUATION  05/09/2012   Procedure: DILATATION AND EVACUATION;  Surgeon: Serita Kyle, MD;  Location: [facility];  Service: Gynecology;  Laterality: N/A;   EYE SURGERY     LASIK     PRK     WISDOM TOOTH EXTRACTION  1994    Her Family History Is Significant  For: Family History  Problem Relation Age of Onset   Hyperlipidemia Mother    Hyperlipidemia Father    Hypertension Father    Anxiety disorder Father    Hyperlipidemia Maternal Grandmother    Hypertension Maternal Grandmother    Heart disease Maternal Grandmother    Heart disease Maternal Grandfather    Diabetes Maternal Grandfather    Stroke Paternal Grandmother    Mental illness Paternal Grandmother    Heart disease Paternal Grandmother    Sleep apnea Neg Hx     Her Social History Is Significant For: Social History   Socioeconomic History   Marital status: Married    Spouse name: Not on file   Number of children: 1   Years of education: Not on file   Highest education level: Not on file  Occupational History   Occupation: certified nurse midwife  Tobacco Use    Smoking status: Never   Smokeless tobacco: Never  Vaping Use   Vaping status: Never Used  Substance and Sexual Activity   Alcohol use: Yes    Alcohol/week: 1.0 standard drink of alcohol    Types: 1 Glasses of wine per week    Comment: rarely   Drug use: No   Sexual activity: Not on file  Other Topics Concern   Not on file  Social History Narrative   Pt lives with husband   Pt works    Social Drivers of Corporate investment banker Strain: Not on Ship broker Insecurity: Not on file  Transportation Needs: Not on file  Physical Activity: Not on file  Stress: Not on file  Social Connections: Not on file    Her Allergies Are:  Allergies  Allergen Reactions   Sulfa Antibiotics Hives    itching   Sunscreens    Thimerosal (Thiomersal) Hives and Itching  :   Her Current Medications Are:  Outpatient Encounter Medications as of 11/12/2023  Medication Sig   acetaminophen (TYLENOL) 650 MG CR tablet Take 650 mg by mouth as needed for pain.   atorvastatin (LIPITOR) 10 MG tablet Take 1 tablet (10 mg total) by mouth daily.   Cholecalciferol (VITAMIN D) 125 MCG (5000 UT) CAPS Take 5,000 Units by mouth daily.   Coenzyme Q10 (CO Q-10 PO) Take by mouth.   fexofenadine (ALLEGRA) 180 MG tablet Take 180 mg by mouth daily.    ibuprofen (ADVIL,MOTRIN) 600 MG tablet Take 600 mg by mouth as needed.   Magnesium 250 MG TABS Take 1 tablet by mouth daily.   naproxen (NAPROSYN) 250 MG tablet Take 500 mg by mouth 2 (two) times daily with a meal.   phentermine 15 MG capsule Take 1 capsule (15 mg total) by mouth daily.   topiramate (TOPAMAX) 100 MG tablet Take 1 tablet (100 mg total) by mouth daily.   Turmeric 500 MG CAPS Take 1 tablet by mouth daily.   valACYclovir (VALTREX) 500 MG tablet Take 1 tablet (500 mg total) by mouth daily for 14 days   albuterol (VENTOLIN HFA) 108 (90 Base) MCG/ACT inhaler INHALE 2 PUFFS INTO THE LUNGS EVERY 4 (FOUR) HOURS AS NEEDED FOR WHEEZING OR SHORTNESS OF BREATH.    chlorpheniramine-HYDROcodone (TUSSIONEX) 10-8 MG/5ML Take 5 mLs by mouth every 12 (twelve) hours as needed for cough.   fluconazole (DIFLUCAN) 150 MG tablet Take 1 tablet (150 mg total) by mouth as directed.   Fluticasone Furoate 50 MCG/ACT AEPB Inhale 2 sprays into the lungs daily.    predniSONE (DELTASONE) 20 MG tablet  Take 3 tablets (60 mg total) by mouth daily with breakfast.   No facility-administered encounter medications on file as of 11/12/2023.  :   Review of Systems:  Out of a complete 14 point review of systems, all are reviewed and negative with the exception of these symptoms as listed below:  Review of Systems  Neurological:        Pt here for sleep consult Pt snores,fatigue,hypertension,headaches Pt states sleep study 2022 pt denies cpap machine   ESS:18 FSS:40     Objective:  Neurological Exam  Physical Exam Physical Examination:   Vitals:   11/12/23 1422  BP: (!) 147/93  Pulse: 99   General Examination: The patient is a very pleasant 53 y.o. female in no acute distress. She appears well-developed and well-nourished and well groomed.   HEENT: Normocephalic, atraumatic, pupils are equal, round and reactive to light, extraocular tracking is good without limitation to gaze excursion or nystagmus noted. Hearing is grossly intact. Face is symmetric with normal facial animation. Speech is clear with no dysarthria noted. There is no hypophonia. There is no lip, neck/head, jaw or voice tremor. Neck is supple with full range of passive and active motion. There are no carotid bruits on auscultation. Oropharynx exam reveals: No significant mouth dryness, good dental hygiene, moderate airway crowding secondary to small airway entry, tonsils on the smaller side, slightly elongated tongue noted.  Tongue protrudes centrally and palate elevates symmetrically.  Mallampati class II.  Neck circumference 14-1/4 inches, minimal to mild overbite noted.   Chest: Clear to auscultation  without wheezing, rhonchi or crackles noted.  Heart: S1+S2+0, regular and normal without murmurs, rubs or gallops noted.   Abdomen: Soft, non-tender and non-distended.  Extremities: There is no pitting edema in the distal lower extremities bilaterally.   Skin: Warm and dry without trophic changes noted.   Musculoskeletal: exam reveals no obvious joint deformities.   Neurologically:  Mental status: The patient is awake, alert and oriented in all 4 spheres. Her immediate and remote memory, attention, language skills and fund of knowledge are appropriate. There is no evidence of aphasia, agnosia, apraxia or anomia. Speech is clear with normal prosody and enunciation. Thought process is linear. Mood is normal and affect is normal.  Cranial nerves II - XII are as described above under HEENT exam.  Motor exam: Normal bulk, strength and tone is noted. There is no obvious action or resting tremor.  Fine motor skills and coordination: grossly intact.  Cerebellar testing: No dysmetria or intention tremor. There is no truncal or gait ataxia.  Sensory exam: intact to light touch in the upper and lower extremities.  Gait, station and balance: She stands easily. No veering to one side is noted. No leaning to one side is noted. Posture is age-appropriate and stance is narrow based. Gait shows normal stride length and normal pace. No problems turning are noted.   Assessment and Plan:  In summary, Robin Knapp is a very pleasant 53 year old female with an underlying medical history of hypertension, hyperlipidemia, allergies, asthma, reflux disease, PCOS, vitamin D deficiency, palpitations, and obesity, who presents for reevaluation of her obstructive sleep apnea.  She was diagnosed with overall mild obstructive sleep apnea with a home sleep test in 2022.  She has been on treatment with a dental device since 2022 through Dr. Alveda Reasons office.  She would like to get reevaluated with treatment in place, we  mutually agreed to try to pursue a laboratory attended sleep study with her oral  appliance in place to check the efficacy of treatment.  She was advised of the treatment option with a CPAP machine or AutoPap machine but reports that she would not consider PAP therapy.  Working on weight loss.  She is advised that we will keep her posted as to her insurance authorization for laboratory testing by phone call or MyChart message.  We will keep her posted as to her test results as well.  If she needs adjustment of her oral appliance through Dr. Alveda Reasons office, we will let her know.  I answered all her questions today and she was in agreement with our plan. Thank you very much for allowing me to participate in the care of this nice patient. If I can be of any further assistance to you please do not hesitate to call me at [phone].  Sincerely,   Huston Foley, MD, PhD

## 2023-11-12 NOTE — Patient Instructions (Addendum)

## 2023-11-14 ENCOUNTER — Telehealth: Payer: Self-pay | Admitting: Neurology

## 2023-11-14 NOTE — Telephone Encounter (Signed)
 LVM for pt to call back to schedule   NPSG & HST [employer] cone no auth req pt needs to wear her oral appliance

## 2023-11-17 ENCOUNTER — Other Ambulatory Visit: Payer: Self-pay | Admitting: Obstetrics & Gynecology

## 2023-11-17 LAB — GENECONNECT MOLECULAR SCREEN: Genetic Analysis Overall Interpretation: NEGATIVE

## 2023-11-17 MED ORDER — AMOXICILLIN-POT CLAVULANATE 875-125 MG PO TABS: 1.0000 | ORAL_TABLET | Freq: Two times a day (BID) | ORAL | 0 refills | Status: DC

## 2023-11-18 ENCOUNTER — Ambulatory Visit: Admitting: Orthopedic Surgery

## 2023-11-18 ENCOUNTER — Other Ambulatory Visit (INDEPENDENT_AMBULATORY_CARE_PROVIDER_SITE_OTHER): Payer: Self-pay

## 2023-11-18 DIAGNOSIS — W540XXA Bitten by dog, initial encounter: Secondary | ICD-10-CM

## 2023-11-18 DIAGNOSIS — M79642 Pain in left hand: Secondary | ICD-10-CM

## 2023-11-18 DIAGNOSIS — S61452A Open bite of left hand, initial encounter: Secondary | ICD-10-CM | POA: Diagnosis not present

## 2023-11-18 NOTE — Progress Notes (Unsigned)
 Robin Knapp - 53 y.o. female MRN [id number]  Date of birth: [birth year]  Office Visit Note: Visit Date: 11/18/2023 PCP: Sabino Dick, DO Referred by: Sabino Dick, DO  Subjective: No chief complaint on file.  HPI: Robin Knapp is a pleasant 53 y.o. female who presents today for evaluation of a left hand dog bite sustained 2 days prior.  It was a family friend's dog, up-to-date on all vaccinations.  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  Presents today for wound check.  Denies any significant numbness or tingling, has maintained range of motion of the digits, has an ongoing pain at the bite sites.  Pertinent ROS were reviewed with the patient and found to be negative unless otherwise specified above in HPI.   Visit Reason: left hand dog bite Duration of symptoms: 2 days Prior Testing/EMG: none Injections (Date):none Treatments: augmentin Prior Surgery: none  Assessment & Plan: Visit Diagnoses:  1. Dog bite, hand, left, initial encounter   2. Pain in left hand     Plan: Fortunately, she is doing quite well status post dog bite injury.  I do not see any evidence of any structural damage, the diabetic wounds are quite superficial in nature.  There is no evidence of any significant erythema or drainage.  I have recommended that she continue utilizing the Augmentin as prescribed and complete the course.  She is welcome to return to me in the future on an as-needed basis for a wound check.  She expressed full understanding.  Follow-up: No follow-ups on file.   Meds & Orders: No orders of the defined types were placed in this encounter.   Orders Placed This Encounter  Procedures   XR Hand Complete Left     Procedures: No procedures performed      Clinical History: No specialty comments available.  She reports that she has never smoked. She has never used smokeless tobacco. No results for input(s):  "HGBA1C", "LABURIC" in the last 8760 hours.  Objective:   Vital Signs: There were no vitals taken for this visit.  Physical Exam  Gen: Well-appearing, in no acute distress; non-toxic CV: Regular Rate. Well-perfused. Warm.  Resp: Breathing unlabored on room air; no wheezing. Psych: Fluid speech in conversation; appropriate affect; normal thought process  Ortho Exam Left hand - Bite marks notable at the index and thumb x2 distally, superficial in nature, no erythema or drainage - Thumb IP flexion/extension well-preserved, sensation intact distally - Index finger PIP and DIP flexion/extension well-preserved, sensation intact distally, hand remains warm well-perfused in all distributions  Imaging: XR Hand Complete Left Result Date: 11/19/2023 X-rays of the left hand, multiple views were obtained X-rays demonstrated no significant bony abnormalities.  Bone mineralization is appropriate.   Past Medical/Family/Surgical/Social History: Medications & Allergies reviewed per EMR, new medications updated. Patient Active Problem List   Diagnosis Date Noted   Obesity (BMI 30.0-34.9) 10/08/2023   Polyphagia 09/07/2023   Bilateral carpal tunnel syndrome 01/17/2018   Other fatigue 11/27/2017   Shortness of breath on exertion 11/27/2017   Vitamin D deficiency 11/27/2017   PCOS (polycystic ovarian syndrome) 11/27/2017   Rash and nonspecific skin eruption 03/27/2015   Facial swelling 03/27/2015   Swelling of both lips 03/27/2015   Past Medical History:  Diagnosis Date   Allergy    Asthma    Breast lump    Constipation    Dry skin    Fatigue  GERD (gastroesophageal reflux disease)    pregnancy related-zantac   HTN (hypertension)    Hyperlipidemia    Infertility, female    Joint pain    Lactose intolerance    Missed abortion    6weeks 3days   Muscle pain    Obesity    Palpitations    PCOS (polycystic ovarian syndrome)    Seasonal allergies    Skin rash    Stress    Vitamin D  deficiency    Family History  Problem Relation Age of Onset   Hyperlipidemia Mother    Hyperlipidemia Father    Hypertension Father    Anxiety disorder Father    Hyperlipidemia Maternal Grandmother    Hypertension Maternal Grandmother    Heart disease Maternal Grandmother    Heart disease Maternal Grandfather    Diabetes Maternal Grandfather    Stroke Paternal Grandmother    Mental illness Paternal Grandmother    Heart disease Paternal Grandmother    Sleep apnea Neg Hx    Past Surgical History:  Procedure Laterality Date   BREAST BIOPSY     CESAREAN SECTION     DILATION AND EVACUATION  05/09/2012   Procedure: DILATATION AND EVACUATION;  Surgeon: Serita Kyle, MD;  Location: [facility];  Service: Gynecology;  Laterality: N/A;   EYE SURGERY     LASIK     PRK     WISDOM TOOTH EXTRACTION  1994   Social History   Occupational History   Occupation: Product manager midwife  Tobacco Use   Smoking status: Never   Smokeless tobacco: Never  Vaping Use   Vaping status: Never Used  Substance and Sexual Activity   Alcohol use: Yes    Alcohol/week: 1.0 standard drink of alcohol    Types: 1 Glasses of wine per week    Comment: rarely   Drug use: No   Sexual activity: Not on file    Evelin Cake Fara Boros) Denese Killings, M.D. [facility], Hand Surgery

## 2023-11-18 NOTE — Telephone Encounter (Signed)
 NPSG [employer] cone no auth req patient is reminded to bring her oral appliance with her   Patient is scheduled at [facility] for 12/17/23 at 8 pm  Mailed packet to the patient as well.

## 2023-12-02 ENCOUNTER — Ambulatory Visit (INDEPENDENT_AMBULATORY_CARE_PROVIDER_SITE_OTHER): Payer: Commercial Managed Care - PPO | Admitting: Family Medicine

## 2023-12-03 ENCOUNTER — Encounter: Payer: Self-pay | Admitting: Orthopedic Surgery

## 2023-12-03 ENCOUNTER — Encounter (INDEPENDENT_AMBULATORY_CARE_PROVIDER_SITE_OTHER): Payer: Self-pay | Admitting: Family Medicine

## 2023-12-05 ENCOUNTER — Encounter (INDEPENDENT_AMBULATORY_CARE_PROVIDER_SITE_OTHER): Payer: Self-pay

## 2023-12-09 ENCOUNTER — Encounter (INDEPENDENT_AMBULATORY_CARE_PROVIDER_SITE_OTHER): Payer: Self-pay

## 2023-12-10 ENCOUNTER — Encounter (INDEPENDENT_AMBULATORY_CARE_PROVIDER_SITE_OTHER): Payer: Self-pay | Admitting: Family Medicine

## 2023-12-10 ENCOUNTER — Ambulatory Visit (INDEPENDENT_AMBULATORY_CARE_PROVIDER_SITE_OTHER): Admitting: Family Medicine

## 2023-12-10 VITALS — BP 135/87 | HR 75 | Temp 97.4°F | Ht 65.0 in | Wt 203.0 lb

## 2023-12-10 DIAGNOSIS — E669 Obesity, unspecified: Secondary | ICD-10-CM | POA: Diagnosis not present

## 2023-12-10 DIAGNOSIS — E66811 Obesity, class 1: Secondary | ICD-10-CM

## 2023-12-10 DIAGNOSIS — R29818 Other symptoms and signs involving the nervous system: Secondary | ICD-10-CM | POA: Diagnosis not present

## 2023-12-10 DIAGNOSIS — Z6833 Body mass index (BMI) 33.0-33.9, adult: Secondary | ICD-10-CM | POA: Diagnosis not present

## 2023-12-10 NOTE — Progress Notes (Signed)
   SUBJECTIVE:  Chief Complaint: Obesity  Interim History: Patient is going to [facility] in 2 days with her daughter's band. She is stressing and stress eating.  She feels like she is stressing over everything.    Robin Knapp is here to discuss her progress with her obesity treatment plan. She is on the keeping a food journal and adhering to recommended goals of 08-1399 calories and 85 protein and states she is following her eating plan approximately 50-60 % of the time. She states she is not exercising.  OBJECTIVE: Visit Diagnoses: Problem List Items Addressed This Visit       Other   Obesity (BMI 30.0-34.9)   Previously did well on GLP1 therapy to help manage and control her prediabetes and her weight.  Unfortunately due to lack of coverage she is no longer able to continue this treatment.  She was trialed on 3 months (at max dose) of makeshift qsymia and did not achieve maintenance of her weight loss and instead saw weight gain and so that was discontinued.  She has continued with her mindful eating with a focus on protein intake in a certain calorie allotment.  Will follow up in 4 weeks to reassess      Suspected sleep apnea - Primary   Patient saw physician for evaluation of OSA.  Sleep study is on April 1.  Will follow up on results at next appointment; she is going to the sleep lab.       Other Visit Diagnoses       BMI 33.0-33.9,adult           No data recorded No data recorded No data recorded No data recorded    12/10/2023    9:00 AM 11/12/2023    2:22 PM 10/08/2023   10:00 AM  Vitals with BMI  Height 5\' 5"  5\' 5"  5\' 5"   Weight 203 lbs 209 lbs 199 lbs  BMI 33.78 34.78 33.12  Systolic 135 147 161  Diastolic 87 93 86  Pulse 75 99 81     ASSESSMENT AND PLAN:  Diet: Robin Knapp is currently in the action stage of change. As such, her goal is to continue with weight loss efforts and has agreed to keeping a food journal and adhering to recommended goals of 1200-1400 calories  and 85 or more grams of protein daily.   Exercise:  For substantial health benefits, adults should do at least 150 minutes (2 hours and 30 minutes) a week of moderate-intensity, or 75 minutes (1 hour and 15 minutes) a week of vigorous-intensity aerobic physical activity, or an equivalent combination of moderate- and vigorous-intensity aerobic activity. Aerobic activity should be performed in episodes of at least 10 minutes, and preferably, it should be spread throughout the week.  Behavior Modification:  We discussed the following Behavioral Modification Strategies today: increasing lean protein intake, increasing vegetables, meal planning and cooking strategies, emotional eating strategies , avoiding temptations, planning for success, and keep a strict food journal.   Return in about 4 weeks (around 01/07/2024).[name] She was informed of the importance of frequent follow up visits to maximize her success with intensive lifestyle modifications for her multiple health conditions.  Attestation Statements:   Reviewed by clinician on day of visit: allergies, medications, problem list, medical history, surgical history, family history, social history, and previous encounter notes.    Reuben Likes, MD

## 2023-12-10 NOTE — Assessment & Plan Note (Signed)
 Patient saw physician for evaluation of OSA.  Sleep study is on April 1.  Will follow up on results at next appointment; she is going to the sleep lab.

## 2023-12-16 NOTE — Assessment & Plan Note (Signed)
 Previously did well on GLP1 therapy to help manage and control her prediabetes and her weight.  Unfortunately due to lack of coverage she is no longer able to continue this treatment.  She was trialed on 3 months (at max dose) of makeshift qsymia and did not achieve maintenance of her weight loss and instead saw weight gain and so that was discontinued.  She has continued with her mindful eating with a focus on protein intake in a certain calorie allotment.  Will follow up in 4 weeks to reassess

## 2023-12-17 ENCOUNTER — Ambulatory Visit: Admitting: Neurology

## 2023-12-17 DIAGNOSIS — E66811 Obesity, class 1: Secondary | ICD-10-CM

## 2023-12-17 DIAGNOSIS — G4720 Circadian rhythm sleep disorder, unspecified type: Secondary | ICD-10-CM

## 2023-12-17 DIAGNOSIS — G4719 Other hypersomnia: Secondary | ICD-10-CM

## 2023-12-17 DIAGNOSIS — R635 Abnormal weight gain: Secondary | ICD-10-CM

## 2023-12-17 DIAGNOSIS — R519 Headache, unspecified: Secondary | ICD-10-CM

## 2023-12-17 DIAGNOSIS — G4733 Obstructive sleep apnea (adult) (pediatric): Secondary | ICD-10-CM | POA: Diagnosis not present

## 2023-12-26 NOTE — Procedures (Signed)
 Physician Interpretation:     [facility] POLYSOMNOGRAPHY  INTERPRETATION REPORT   STUDY DATE:  12/17/2023     PATIENT NAME:  Robin Knapp         DATE OF BIRTH:  [birth year]  PATIENT ID:  [id number]    TYPE OF STUDY:  PSG  READING PHYSICIAN: Huston Foley, MD, PhD   SCORING TECHNICIAN: Domingo Cocking, RPSGT   Referred by: Dr. Donny Pique ? History and Indication for Testing: 53 year old female with an underlying medical history of hypertension, hyperlipidemia, allergies, asthma, reflux disease, PCOS, vitamin D deficiency, palpitations, and obesity, who reports snoring and excessive daytime somnolence as well as a prior diagnosis of mild obstructive sleep apnea. Her Epworth sleepiness score is 18 out of 24, fatigue severity score is 40 out of 63. She has not been on PAP therapy but uses a dental device, which she wore during this study. Height: 65 in Weight: 209 lb (BMI 34) Neck Size: 14 in    MEDICATIONS: Lipitor, Vitamin D, Co-Q10, Advil, magnesium, Naprosyn, Phentermine, Topomax, Turneric, Valtrex, Ventolin, Tussionex, Diflucan, Fluticasone, Deltasone   TECHNICAL DESCRIPTION: A registered sleep technologist was in attendance for the duration of the recording.  Data collection, scoring, video monitoring, and reporting were performed in compliance with the AASM Manual for the Scoring of Sleep and Associated Events; (Hypopnea is scored based on the criteria listed in Section VIII D. 1b in the AASM Manual V2.6 using a 4% oxygen desaturation rule or Hypopnea is scored based on the criteria listed in Section VIII D. 1a in the AASM Manual V2.6 using 3% oxygen desaturation and /or arousal rule).   SLEEP CONTINUITY AND SLEEP ARCHITECTURE:  Lights-out was at 21:01: and lights-on at  05:00:, with a total recording time of 7 hours, 58.5 min. Total sleep time ( TST) was 329.5 minutes with a decreased sleep efficiency at 68.9%. There was  13.8% REM sleep.  BODY POSITION:   TST was divided  between the following sleep positions: 58.9% supine;  41.1% lateral;  0% prone. Duration of total sleep and percent of total sleep in their respective position is as follows: supine 194 minutes (59%), non-supine 136 minutes (41%); right 00 minutes (0%), left 135 minutes (41%), and prone 00 minutes (0%).  Total supine REM sleep time was 17 minutes (37% of total REM sleep).  Sleep latency was mildly increased at 27.0 minutes.  REM sleep latency was milldy below normal at 67.5 minutes. Of the total sleep time, the percentage of stage N1 sleep was 19.4%, which is markedly increased, stage N2 sleep was 67%, which is increased, stage N3 sleep was absent, and REM sleep was 13.8%, which is reduced. Wake after sleep onset (WASO) time accounted for 122 minutes with mild to moderate sleep fragmentation noted.   RESPIRATORY MONITORING:   Based on CMS criteria (using a 4% oxygen desaturation rule for scoring hypopneas), there were 1 apneas (1 obstructive; 0 central; 0 mixed), and 21 hypopneas.  Apnea index was 0.2. Hypopnea index was 3.8. The apnea-hypopnea index was 4.0 overall (3.4 supine, 13 non-supine; 22.4 REM, 38.8 supine REM).  There were 0 respiratory effort-related arousals (RERAs).  The RERA index was 0 events/h. Total respiratory disturbance index (RDI) was 4.0 events/h. RDI results showed: supine RDI  3.4 /h; non-supine RDI 4.9 /h; REM RDI 22.4 /h, supine REM RDI 38.8 /h.   Based on AASM criteria (using a 3% oxygen desaturation and /or arousal rule for scoring hypopneas), there were 1 apneas (  1 obstructive; 0 central; 0 mixed), and 30 hypopneas. Apnea index was 0.2. Hypopnea index was 5.5. The apnea-hypopnea index was 5.6/hour overall (4.0 supine, 21 non-supine; 29.0 REM, 42.4 supine REM).  There were 0 respiratory effort-related arousals (RERAs).  The RERA index was 0 events/h. Total respiratory disturbance index (RDI) was 5.6 events/h. RDI results showed: supine RDI  4.0 /h; non-supine  RDI 8.0 /h; REM RDI 29.0 /h, supine REM RDI 42.4 /h.  OXIMETRY: Oxyhemoglobin Saturation Nadir during sleep was at  87% from a mean of 94%.  Of the Total sleep time (TST)   hypoxemia (=<88%) was present for  0.3 minutes, or 0.1% of total sleep time.  LIMB MOVEMENTS: There were 0 periodic limb movements of sleep (0.0/hr), of which 0 (0.0/hr) were associated with an arousal. AROUSAL: There were 24 arousals in total, for an arousal index of 4 arousals/hour.  Of these, 2 were identified as respiratory-related arousals (0 /h), 0 were PLM-related arousals (0 /h), and 42 were non-specific arousals (8 /h).   EEG: Review of the EEG showed no abnormal electrical discharges and symmetrical bihemispheric findings.    EKG: The EKG revealed normal sinus rhythm (NSR). The average heart rate during sleep was 81 bpm.  AUDIO/VIDEO REVIEW: The audio and video review did not show any abnormal or unusual behaviors, movements, phonations or vocalizations. The patient took 1 restroom break. Snoring was noted, in the mild range. The patient wore her dental device during this study.  POST-STUDY QUESTIONNAIRE: Post study, the patient indicated, that sleep was the same as usual.   IMPRESSION:    1. Mild Obstructive Sleep Apnea (OSA) 2. Dysfunctions associated with sleep stages or arousal from sleep   RECOMMENDATIONS:    1. This study demonstrates overall borderline or rather mild residual obstructive sleep apnea, while on treatment with an oral appliance. An adjustment of her dental device may improve her residual mild snoring and mild OSA. The patient will be advised to follow up with her dentist, Dr. Toni Arthurs for this. Other and adjunct treatment options may include avoidance of supine sleep position along with weight loss (where clinically feasible and applicable). Generally speaking, surgical treatment options are not recommended for mild obstructive sleep apnea. PAP therapy can be considered, if desired by the patient.   2. This study shows sleep fragmentation and abnormal sleep stage percentages; these are nonspecific findings and per se do not signify an intrinsic sleep disorder or a cause for the patient's sleep-related symptoms. Causes include (but are not limited to) the first night effect of the sleep study, circadian rhythm disturbances, medication effect or an underlying mood disorder or medical problem.  3. The patient should be cautioned not to drive, work at heights, or operate dangerous or heavy equipment when tired or sleepy. Review and reiteration of good sleep hygiene measures should be pursued with any patient. 4. The patient will be advised of the test results and encouraged to follow-up with her dentist. The referring provider will be notified of the test results.    I certify that I have reviewed the entire raw data recording prior to the issuance of this report in accordance with the Standards of Accreditation of the American Academy of Sleep Medicine (AASM).  Huston Foley, MD, PhD Medical Director, [facility]) Diplomat, ABPN (Neurology and Sleep)              Technical Report:   General Information  Name: Morgen, Linebaugh BMI: 34.78 Physician: Huston Foley,  MD  ID: [id number] Height: 65.0 in Technician: Domingo Cocking, RPSGT  Sex: Female Weight: 209.0 lb Record: [id number]  Age: 70 [08-24-1971] Date: 12/17/2023    Medical & Medication History    Ms. Kulak is a 53 year old female with an underlying medical history of hypertension, hyperlipidemia, allergies, asthma, reflux disease, PCOS, vitamin D deficiency, palpitations, and obesity, who reports snoring and excessive daytime somnolence as well as a prior diagnosis of mild obstructive sleep apnea. Her Epworth sleepiness score is 18 out of 24, fatigue severity score is 40 out of 63. Of note, she had a home sleep test in 2022 reviewed with her test results. She had testing through [facility] sleep lab with a home sleep test on 03/02/2021. Total AHI was 8.6/h, O2 nadir 85%. She has not been on PAP therapy. She would not consider PAP therapy. She has been on treatment with an oral appliance since 2022 through Dr. Ether Griffins office. Uses her oral appliance every night. She feels that her sleep apnea has become worse over time. I do not have any records from Dr. Alveda Reasons office for review at this time. Patient endorses that she had subsequent home sleep testing after receiving her oral appliance. She would like to get reevaluated for her sleep apnea through our office. She denies nightly nocturia. She has a history of morning headaches but these have improved. She is not aware of any family history of sleep apnea. 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 Bedtime is generally between 11 and midnight and rise time around 6 AM. Previously testing was through [facility] from what I can see in the chart. She has some trouble going back to sleep. She does not drink caffeine daily. She drinks alcohol rarely. She is a non-smoker.  Lipitor, Vitamin D, Co-Q10, Advil, magnesium, Naprosyn, Phentermine, Topomax, Turneric, Valtrex, Ventolin, Tussionex, Diflucan, Fluticasone, Deltasone   Sleep Disorder      Comments   Patient arrived for a diagnostic polysomnogram. Procedure explained and all questions answered. Standard paste setup without complications. Patient is wearing her dental device for this study. Patient slept supine and left. Mild snoring was noted. Respiratory events observed, primarily while in REM sleep. No obvious cardiac arrhythmias observed. No significant PLMS observed. One restroom visit.    Lights out: 09:01:24 PM Lights on: 05:00:12 AM   Time Total Supine Side Prone Upright  Recording (TRT) 7h 58.30m 4h 22.7m 3h 36.55m 0h 0.14m 0h 0.42m  Sleep (TST) 5h 29.10m 3h 14.32m 2h 15.45m 0h 0.62m 0h 0.29m   Latency N1 N2 N3 REM Onset Per. Slp. Eff.  Actual 0h 0.44m 0h  4.23m 0h 0.80m 1h 7.28m 0h 27.19m 0h 44.51m 68.86%   Stg Dur Wake N1 N2 N3 REM  Total 149.0 64.0 220.0 0.0 45.5  Supine 68.0 31.0 146.0 0.0 17.0  Side 81.0 33.0 74.0 0.0 28.5  Prone 0.0 0.0 0.0 0.0 0.0  Upright 0.0 0.0 0.0 0.0 0.0   Stg % Wake N1 N2 N3 REM  Total 31.1 19.4 66.8 0.0 13.8  Supine 14.2 9.4 44.3 0.0 5.2  Side 16.9 10.0 22.5 0.0 8.6  Prone 0.0 0.0 0.0 0.0 0.0  Upright 0.0 0.0 0.0 0.0 0.0     Apnea Summary Sub Supine Side Prone Upright  Total 1 Total 1 0 1 0 0    REM 0 0 0 0 0    NREM 1 0 1 0 0  Obs 1 REM 0 0 0 0 0  NREM 1 0 1 0 0  Mix 0 REM 0 0 0 0 0    NREM 0 0 0 0 0  Cen 0 REM 0 0 0 0 0    NREM 0 0 0 0 0   Rera Summary Sub Supine Side Prone Upright  Total 0 Total 0 0 0 0 0    REM 0 0 0 0 0    NREM 0 0 0 0 0   Hypopnea Summary Sub Supine Side Prone Upright  Total 30 Total 30 13 17  0 0    REM 22 12 10  0 0    NREM 8 1 7  0 0   4% Hypopnea Summary Sub Supine Side Prone Upright  Total (4%) 21 Total 21 11 10  0 0    REM 17 11 6  0 0    NREM 4 0 4 0 0     AHI Total Obs Mix Cen  5.64 Apnea 0.18 0.18 0.00 0.00   Hypopnea 5.46 -- -- --  4.01 Hypopnea (4%) 3.82 -- -- --    Total Supine Side Prone Upright  Position AHI 5.64 4.02 7.97 0.00 0.00  REM AHI 29.01   NREM AHI 1.90   Position RDI 5.64 4.02 7.97 0.00 0.00  REM RDI 29.01   NREM RDI 1.90    4% Hypopnea Total Supine Side Prone Upright  Position AHI (4%) 4.01 3.40 4.87 0.00 0.00  REM AHI (4%) 22.42   NREM AHI (4%) 1.06   Position RDI (4%) 4.01 3.40 4.87 0.00 0.00  REM RDI (4%) 22.42   NREM RDI (4%) 1.06    Desaturation Information Threshold: 2% <100% <90% <80% <70% <60% <50% <40%  Supine 79.0 8.0 0.0 0.0 0.0 0.0 0.0  Side 104.0 8.0 0.0 0.0 0.0 0.0 0.0  Prone 0.0 0.0 0.0 0.0 0.0 0.0 0.0  Upright 0.0 0.0 0.0 0.0 0.0 0.0 0.0  Total 183.0 16.0 0.0 0.0 0.0 0.0 0.0  Index 24.3 2.1 0.0 0.0 0.0 0.0 0.0   Threshold: 3% <100% <90% <80% <70% <60% <50% <40%  Supine 31.0 7.0 0.0 0.0 0.0 0.0 0.0  Side 41.0  8.0 0.0 0.0 0.0 0.0 0.0  Prone 0.0 0.0 0.0 0.0 0.0 0.0 0.0  Upright 0.0 0.0 0.0 0.0 0.0 0.0 0.0  Total 72.0 15.0 0.0 0.0 0.0 0.0 0.0  Index 9.6 2.0 0.0 0.0 0.0 0.0 0.0   Threshold: 4% <100% <90% <80% <70% <60% <50% <40%  Supine 17.0 7.0 0.0 0.0 0.0 0.0 0.0  Side 22.0 8.0 0.0 0.0 0.0 0.0 0.0  Prone 0.0 0.0 0.0 0.0 0.0 0.0 0.0  Upright 0.0 0.0 0.0 0.0 0.0 0.0 0.0  Total 39.0 15.0 0.0 0.0 0.0 0.0 0.0  Index 5.2 2.0 0.0 0.0 0.0 0.0 0.0   Threshold: 3% <100% <90% <80% <70% <60% <50% <40%  Supine 31 7 0 0 0 0 0  Side 41 8 0 0 0 0 0  Prone 0 0 0 0 0 0 0  Upright 0 0 0 0 0 0 0  Total 72 15 0 0 0 0 0   Awakening/Arousal Information # of Awakenings 33  Wake after sleep onset 122.91m  Wake after persistent sleep 115.19m   Arousal Assoc. Arousals Index  Apneas 0 0.0  Hypopneas 2 0.4  Leg Movements 1 0.2  Snore 0 0.0  PTT Arousals 0 0.0  Spontaneous 42 7.6  Total 45 8.2  Leg Movement Information PLMS LMs Index  Total LMs during PLMS  0 0.0  LMs w/ Microarousals 0 0.0   LM LMs Index  w/ Microarousal 1 0.2  w/ Awakening 1 0.2  w/ Resp Event 0 0.0  Spontaneous 0 0.0  Total 1 0.2     Desaturation threshold setting: 3% Minimum desaturation setting: 10 seconds SaO2 nadir: 75% The longest event was a 30 sec obstructive Hypopnea with a minimum SaO2 of 92%. The lowest SaO2 was 88% associated with a 14 sec obstructive Hypopnea. EKG Rates EKG Avg Max Min  Awake 85 125 72  Asleep 81 98 72  EKG Events: Tachycardia

## 2023-12-27 ENCOUNTER — Encounter: Payer: Self-pay | Admitting: Allergy

## 2023-12-27 ENCOUNTER — Other Ambulatory Visit: Payer: Self-pay

## 2023-12-27 ENCOUNTER — Ambulatory Visit: Payer: Commercial Managed Care - PPO | Admitting: Allergy

## 2023-12-27 ENCOUNTER — Other Ambulatory Visit (HOSPITAL_COMMUNITY): Payer: Self-pay

## 2023-12-27 VITALS — BP 126/78 | HR 83 | Temp 98.2°F | Resp 16 | Ht 65.25 in | Wt 213.2 lb

## 2023-12-27 DIAGNOSIS — J302 Other seasonal allergic rhinitis: Secondary | ICD-10-CM

## 2023-12-27 DIAGNOSIS — J4520 Mild intermittent asthma, uncomplicated: Secondary | ICD-10-CM

## 2023-12-27 DIAGNOSIS — J3089 Other allergic rhinitis: Secondary | ICD-10-CM | POA: Diagnosis not present

## 2023-12-27 DIAGNOSIS — L578 Other skin changes due to chronic exposure to nonionizing radiation: Secondary | ICD-10-CM | POA: Diagnosis not present

## 2023-12-27 MED ORDER — ALBUTEROL SULFATE HFA 108 (90 BASE) MCG/ACT IN AERS
2.0000 | INHALATION_SPRAY | RESPIRATORY_TRACT | 1 refills | Status: DC | PRN
  Filled 2023-12-27: qty 6.7, 25d supply, fill #0
  Filled 2024-04-30: qty 6.7, 25d supply, fill #1

## 2023-12-27 MED ORDER — FEXOFENADINE HCL 180 MG PO TABS
180.0000 mg | ORAL_TABLET | Freq: Every day | ORAL | 3 refills | Status: AC
Start: 2023-12-27 — End: ?
  Filled 2023-12-27 – 2024-04-30 (×2): qty 90, 90d supply, fill #0

## 2023-12-27 MED ORDER — RYALTRIS 665-25 MCG/ACT NA SUSP: 2.0000 | Freq: Two times a day (BID) | NASAL | 5 refills | Status: DC | PRN

## 2023-12-27 NOTE — Progress Notes (Signed)
 Follow-up Note  RE: Robin Knapp MRN: [id number] DOB: 06-May-1971 Date of Office Visit: 12/27/2023   History of present illness: Robin Knapp is a 53 y.o. female presenting today for follow-up of allergic rhinitis, asthma and solar dermatitis.  She was last seen in the office on 08/24/21 by myself.  Discussed the use of AI scribe software for clinical note transcription with the patient, who gave verbal consent to proceed.  Over the past two weeks, she has experienced worsening allergy symptoms, coinciding with high pollen counts. Symptoms include shortness of breath, wheezing, increased coughing, scratchy throat, and a stuffy nose. These symptoms have worsened despite her regular use of Flonase and Allegra, which previously managed her symptoms effectively.  She has used her rescue inhaler twice in the last two weeks, providing only slight relief. Her symptoms, particularly the drainage, have been affecting her sleep, waking her up at night.  She recalls a recent episode about a month ago where she experienced a prolonged cough after an illness, which improved with a steroid dose pack prescribed by a colleague.  In terms of skin sensitivity, she avoids sun exposure due to skin eruptions but has found a [employer] brand sunscreen that does not cause a reaction and provides sun protection.     Review of systems: 10pt ROS negative unless noted above in HPI  Past medical/social/surgical/family history have been reviewed and are unchanged unless specifically indicated below.  No changes  Medication List: Current Outpatient Medications  Medication Sig Dispense Refill   acetaminophen (TYLENOL) 650 MG CR tablet Take 650 mg by mouth as needed for pain.     atorvastatin (LIPITOR) 10 MG tablet Take 1 tablet (10 mg total) by mouth daily. 90 tablet 1   Cholecalciferol (VITAMIN D) 125 MCG (5000 UT) CAPS Take 5,000 Units by mouth daily.     Coenzyme Q10 (CO Q-10 PO) Take by mouth.      fexofenadine (ALLEGRA) 180 MG tablet Take 180 mg by mouth daily.      fluticasone (FLONASE) 50 MCG/ACT nasal spray Place 2 sprays into both nostrils daily.     ibuprofen (ADVIL,MOTRIN) 600 MG tablet Take 600 mg by mouth as needed.     Magnesium 250 MG TABS Take 1 tablet by mouth daily.     naproxen (NAPROSYN) 250 MG tablet Take 500 mg by mouth 2 (two) times daily with a meal.     Turmeric 500 MG CAPS Take 1 tablet by mouth daily.     valACYclovir (VALTREX) 500 MG tablet Take 1 tablet (500 mg total) by mouth daily for 14 days 14 tablet 1   vitamin B-12 (CYANOCOBALAMIN) 100 MCG tablet Take 100 mcg by mouth daily.     No current facility-administered medications for this visit.     Known medication allergies: Allergies  Allergen Reactions   Benzoin Dermatitis, Hives and Itching   Sulfa Antibiotics Hives    itching   Sunscreens    Thimerosal (Thiomersal) Hives and Itching     Physical examination: Blood pressure 126/78, pulse 83, temperature 98.2 F (36.8 C), temperature source Temporal, resp. rate 16, height 5' 5.25" (1.657 m), weight 213 lb 3.2 oz (96.7 kg), SpO2 96%.  General: Alert, interactive, in no acute distress. HEENT: PERRLA, TMs pearly gray, turbinates moderately edematous without discharge, post-pharynx non erythematous. Neck: Supple without lymphadenopathy. Lungs: Clear to auscultation without wheezing, rhonchi or rales. {no increased work of breathing. CV: Normal S1, S2 without murmurs. Abdomen: Nondistended, nontender. Skin: Warm and  dry, without lesions or rashes. Extremities:  No clubbing, cyanosis or edema. Neuro:   Grossly intact.  Diagnositics/Labs:  Spirometry: FEV1: 2.4L 99%, FVC: 3.14L 104%, ratio consistent with nonobstructive pattern  Assessment and plan:  Allergic rinitis  - continue avoidance measures for grass pollen, weed pollen, tree pollen, mold, dust mite, cat - continue Allegra 180mg  daily at this time especially during pollen season -  recommend use of Ryaltris nasal spray 2 sprays each nostril up to twice a day as needed for nasal congestion and drainage control.   This is a combination nasal spray that is better tolerated than Dymista.  It goes to a specialty pharmacy in [place] and gets mailed to home.   With using nasal sprays point tip of bottle toward eye on same side nostril and lean head slightly forward for best technique.   Hold Flonase while using Ryaltris. . - allergen immunotherapy has been discussed previously including protocol, benefits and risk if medication management is not effective.     Mild persistent asthma - have access to albuterol inhaler 2 puffs every 4-6 hours as needed for cough/wheeze/shortness of breath/chest tightness.  May use 15-20 minutes prior to activity.   Monitor frequency of use.    Contact dermatitis/solar dermatitis - for known sun exposure if needed you can double your Allegra dose (2 tabs) and also take Pepcid 20-40mg  to help minimize your histamine response  -Continue with use of sunscreen for skin protection  Follow-up 6-12 months or sooner if needed  I appreciate the opportunity to take part in Robin Knapp's care. Please do not hesitate to contact me with questions.  Sincerely,   Margo Aye, MD Allergy/Immunology [facility]

## 2023-12-27 NOTE — Patient Instructions (Addendum)
 Allergic rinitis  - continue avoidance measures for grass pollen, weed pollen, tree pollen, mold, dust mite, cat - continue Allegra 180mg  daily at this time especially during pollen season - recommend use of Ryaltris nasal spray 2 sprays each nostril up to twice a day as needed for nasal congestion and drainage control.   This is a combination nasal spray that is better tolerated than Dymista.  It goes to a specialty pharmacy in [place] and gets mailed to home.   With using nasal sprays point tip of bottle toward eye on same side nostril and lean head slightly forward for best technique.   Hold Flonase while using Ryaltris. . - allergen immunotherapy has been discussed previously including protocol, benefits and risk if medication management is not effective.     Mild persistent asthma - have access to albuterol inhaler 2 puffs every 4-6 hours as needed for cough/wheeze/shortness of breath/chest tightness.  May use 15-20 minutes prior to activity.   Monitor frequency of use.    Contact dermatitis/solar dermatitis - for known sun exposure if needed you can double your Allegra dose (2 tabs) and also take Pepcid 20-40mg  to help minimize your histamine response  -Continue with use of sunscreen for skin protection  Follow-up 6-12 months or sooner if needed

## 2023-12-31 ENCOUNTER — Other Ambulatory Visit (HOSPITAL_COMMUNITY): Payer: Self-pay

## 2023-12-31 ENCOUNTER — Telehealth: Payer: Self-pay

## 2023-12-31 NOTE — Telephone Encounter (Signed)
 Called pt and was able to discuss her sleep study results as noted below by Dr Omar Bibber. The patient verbalized understanding to follow-up with Dr Abel Abelson for oral appliance adjustment. She would like the sleep study results sent over to Dr Abel Abelson. I told her the request would be sent to our medical records team to facilitate.

## 2023-12-31 NOTE — Telephone Encounter (Addendum)
 LVM for pt to call back with her below sleep study results, per Dr. Omar Bibber.   ----- Message from Debbra Fairy sent at 12/26/2023  7:03 PM EDT ----- Please call and notify the patient that the recent sleep study showed rather mild OSA, while on treatment with her oral appliance. I recommend she FU with Dr. Abel Abelson for a possible adjustment of her oral appliance for her mild snoring and mild residual obstructive sleep apnea.  At this juncture, she can FU with medical wt management and her other providers.   Thanks,  Debbra Fairy, MD, PhD [facility])

## 2023-12-31 NOTE — Telephone Encounter (Signed)
 Pt returned the call. Pt stated that she has a 1:30pm appt so if a return call happens during that time her phone will be on do not disturb.

## 2024-01-10 ENCOUNTER — Other Ambulatory Visit (HOSPITAL_COMMUNITY): Payer: Self-pay

## 2024-01-10 DIAGNOSIS — Z0000 Encounter for general adult medical examination without abnormal findings: Secondary | ICD-10-CM | POA: Diagnosis not present

## 2024-01-10 DIAGNOSIS — E282 Polycystic ovarian syndrome: Secondary | ICD-10-CM | POA: Diagnosis not present

## 2024-01-10 DIAGNOSIS — I872 Venous insufficiency (chronic) (peripheral): Secondary | ICD-10-CM | POA: Diagnosis not present

## 2024-01-10 DIAGNOSIS — E7800 Pure hypercholesterolemia, unspecified: Secondary | ICD-10-CM | POA: Diagnosis not present

## 2024-01-10 DIAGNOSIS — R202 Paresthesia of skin: Secondary | ICD-10-CM | POA: Diagnosis not present

## 2024-01-10 DIAGNOSIS — B009 Herpesviral infection, unspecified: Secondary | ICD-10-CM | POA: Diagnosis not present

## 2024-01-10 MED ORDER — ATORVASTATIN CALCIUM 10 MG PO TABS
10.0000 mg | ORAL_TABLET | Freq: Every day | ORAL | 1 refills | Status: DC
  Filled 2024-01-10: qty 90, 90d supply, fill #0
  Filled 2024-04-30: qty 90, 90d supply, fill #1

## 2024-01-10 MED ORDER — VALACYCLOVIR HCL 500 MG PO TABS
500.0000 mg | ORAL_TABLET | Freq: Every day | ORAL | 1 refills | Status: AC | PRN
  Filled 2024-01-10: qty 14, 14d supply, fill #0

## 2024-01-13 ENCOUNTER — Other Ambulatory Visit (HOSPITAL_COMMUNITY): Payer: Self-pay

## 2024-01-20 ENCOUNTER — Encounter (INDEPENDENT_AMBULATORY_CARE_PROVIDER_SITE_OTHER): Payer: Self-pay | Admitting: Family Medicine

## 2024-01-20 ENCOUNTER — Ambulatory Visit (INDEPENDENT_AMBULATORY_CARE_PROVIDER_SITE_OTHER): Admitting: Family Medicine

## 2024-01-20 VITALS — BP 136/81 | HR 76 | Temp 98.2°F | Ht 65.0 in | Wt 213.0 lb

## 2024-01-20 DIAGNOSIS — E7849 Other hyperlipidemia: Secondary | ICD-10-CM

## 2024-01-20 DIAGNOSIS — Z6835 Body mass index (BMI) 35.0-35.9, adult: Secondary | ICD-10-CM | POA: Diagnosis not present

## 2024-01-20 DIAGNOSIS — E66811 Obesity, class 1: Secondary | ICD-10-CM | POA: Diagnosis not present

## 2024-01-20 NOTE — Progress Notes (Signed)
 SUBJECTIVE:  Chief Complaint: Obesity  Interim History: Patient feeling frustrated with lack of insurance coverage for GLP1.  She saw her PCP and her LFTs were elevated and she had a repeat sleep study which showed mild sleep apnea.  She recognizes she is self sabotaging.  She isn't in the best head space about how she looks and how she fits into her clothes.  She isn't able to exercise like she did previously.   Robin Knapp is here to discuss her progress with her obesity treatment plan. She is on the keeping a food journal and adhering to recommended goals of 1200-1400 calories and 85 grams of protein and states she is following her eating plan approximately 40 % of the time. She states she is walking.   OBJECTIVE: Visit Diagnoses: Problem List Items Addressed This Visit       Other   Obesity (BMI 30.0-34.9)   Starting BMI 33.12 and weight of 199lb.  Anthropometric Measurements Height: 5\' 5"  (1.651 m) Weight: 213 lb (96.6 kg) BMI (Calculated): 35.44 Weight at Last Visit: 203 lb Weight Lost Since Last Visit: 0 Weight Gained Since Last Visit: 10 lb Starting Weight: 199 lb Total Weight Loss (lbs): 0 lb (0 kg) Body Composition  Body Fat %: 40.2 % Fat Mass (lbs): 85.8 lbs Muscle Mass (lbs): 121.2 lbs Total Body Water (lbs): 83.8 lbs Visceral Fat Rating : 11 Other Clinical Data Today's Visit #: 39 Starting Date: 11/27/17 Comments: 1200-1400/85       Other hyperlipidemia - Primary   Last fasting lipid panel showing elevated LDL.  Needs a repeat FLP at next appointment.        Other Visit Diagnoses       Obesity with starting BMI of 33.12         BMI 35.0-35.9,adult           No data recorded       01/20/2024   10:00 AM 12/27/2023   10:34 AM 12/10/2023    9:00 AM  Vitals with BMI  Height 5\' 5"  5' 5.25" 5\' 5"   Weight 213 lbs 213 lbs 3 oz 203 lbs  BMI 35.44 35.22 33.78  Systolic 136 126 409  Diastolic 81 78 87  Pulse 76 83 75      ASSESSMENT AND  PLAN:  Diet: Robin Knapp is currently in the action stage of change. As such, her goal is to continue with weight loss efforts and has agreed to practicing portion control and making smarter food choices, such as increasing vegetables and decreasing simple carbohydrates.   Exercise:  For substantial health benefits, adults should do at least 150 minutes (2 hours and 30 minutes) a week of moderate-intensity, or 75 minutes (1 hour and 15 minutes) a week of vigorous-intensity aerobic physical activity, or an equivalent combination of moderate- and vigorous-intensity aerobic activity. Aerobic activity should be performed in episodes of at least 10 minutes, and preferably, it should be spread throughout the week.  Behavior Modification:  We discussed the following Behavioral Modification Strategies today: increasing lean protein intake, decreasing simple carbohydrates, increasing vegetables, meal planning and cooking strategies, keeping healthy foods in the home, and keep a strict food journal.   Return in about 4 months (around 05/22/2024) for repeat IC.   She was informed of the importance of frequent follow up visits to maximize her success with intensive lifestyle modifications for her multiple health conditions.  Attestation Statements:   Reviewed by clinician on day of visit: allergies, medications, problem list,  medical history, surgical history, family history, social history, and previous encounter notes.    Donaciano Frizzle, MD

## 2024-01-26 DIAGNOSIS — E7849 Other hyperlipidemia: Secondary | ICD-10-CM | POA: Insufficient documentation

## 2024-01-26 NOTE — Assessment & Plan Note (Signed)
 Last fasting lipid panel showing elevated LDL.  Needs a repeat FLP at next appointment.

## 2024-01-26 NOTE — Assessment & Plan Note (Signed)
 Starting BMI 33.12 and weight of 199lb.  Anthropometric Measurements Height: 5\' 5"  (1.651 m) Weight: 213 lb (96.6 kg) BMI (Calculated): 35.44 Weight at Last Visit: 203 lb Weight Lost Since Last Visit: 0 Weight Gained Since Last Visit: 10 lb Starting Weight: 199 lb Total Weight Loss (lbs): 0 lb (0 kg) Body Composition  Body Fat %: 40.2 % Fat Mass (lbs): 85.8 lbs Muscle Mass (lbs): 121.2 lbs Total Body Water (lbs): 83.8 lbs Visceral Fat Rating : 11 Other Clinical Data Today's Visit #: 89 Starting Date: 11/27/17 Comments: 1200-1400/85

## 2024-02-13 DIAGNOSIS — R748 Abnormal levels of other serum enzymes: Secondary | ICD-10-CM | POA: Diagnosis not present

## 2024-02-25 DIAGNOSIS — R748 Abnormal levels of other serum enzymes: Secondary | ICD-10-CM | POA: Diagnosis not present

## 2024-03-26 ENCOUNTER — Other Ambulatory Visit (HOSPITAL_COMMUNITY): Payer: Self-pay

## 2024-03-26 ENCOUNTER — Other Ambulatory Visit: Payer: Self-pay | Admitting: Certified Nurse Midwife

## 2024-03-26 DIAGNOSIS — R053 Chronic cough: Secondary | ICD-10-CM

## 2024-03-26 MED ORDER — HYDROCOD POLI-CHLORPHE POLI ER 10-8 MG/5ML PO SUER: 5.0000 mL | Freq: Two times a day (BID) | ORAL | Status: AC

## 2024-03-26 MED ORDER — PREDNISONE 10 MG (21) PO TBPK
ORAL_TABLET | ORAL | 0 refills | Status: DC
  Filled 2024-03-26: qty 21, 6d supply, fill #0

## 2024-03-31 DIAGNOSIS — Z1231 Encounter for screening mammogram for malignant neoplasm of breast: Secondary | ICD-10-CM | POA: Diagnosis not present

## 2024-04-01 ENCOUNTER — Encounter: Payer: Self-pay | Admitting: Allergy

## 2024-04-02 ENCOUNTER — Other Ambulatory Visit: Payer: Self-pay | Admitting: Allergy

## 2024-04-03 ENCOUNTER — Telehealth: Payer: Self-pay

## 2024-04-03 ENCOUNTER — Other Ambulatory Visit: Payer: Self-pay | Admitting: Allergy

## 2024-04-03 ENCOUNTER — Other Ambulatory Visit (HOSPITAL_COMMUNITY): Payer: Self-pay

## 2024-04-03 ENCOUNTER — Other Ambulatory Visit: Payer: Self-pay | Admitting: *Deleted

## 2024-04-03 MED ORDER — BUDESONIDE-FORMOTEROL FUMARATE 160-4.5 MCG/ACT IN AERO
2.0000 | INHALATION_SPRAY | Freq: Two times a day (BID) | RESPIRATORY_TRACT | 5 refills | Status: DC
  Filled 2024-04-03: qty 10.2, 30d supply, fill #0
  Filled 2024-04-30: qty 10.2, 30d supply, fill #1

## 2024-04-03 MED ORDER — HYDROCOD POLI-CHLORPHE POLI ER 10-8 MG/5ML PO SUER
5.0000 mL | Freq: Two times a day (BID) | ORAL | 0 refills | Status: DC | PRN
  Filled 2024-04-03: qty 70, 7d supply, fill #0

## 2024-04-03 NOTE — Telephone Encounter (Signed)
 rx for tussionex has to be ordered and signed by Dr. Jeneal. Patient's husband plans to pick up script today from the [place] office. The envelope has been place in patient pickup on the shot room side.

## 2024-04-27 ENCOUNTER — Ambulatory Visit (INDEPENDENT_AMBULATORY_CARE_PROVIDER_SITE_OTHER): Admitting: Family Medicine

## 2024-04-30 ENCOUNTER — Ambulatory Visit (INDEPENDENT_AMBULATORY_CARE_PROVIDER_SITE_OTHER): Admitting: Family Medicine

## 2024-04-30 ENCOUNTER — Encounter (INDEPENDENT_AMBULATORY_CARE_PROVIDER_SITE_OTHER): Payer: Self-pay | Admitting: Family Medicine

## 2024-04-30 ENCOUNTER — Other Ambulatory Visit (HOSPITAL_COMMUNITY): Payer: Self-pay

## 2024-04-30 VITALS — BP 132/81 | HR 83 | Temp 98.2°F | Ht 65.0 in | Wt 214.0 lb

## 2024-04-30 DIAGNOSIS — Z6833 Body mass index (BMI) 33.0-33.9, adult: Secondary | ICD-10-CM

## 2024-04-30 DIAGNOSIS — E669 Obesity, unspecified: Secondary | ICD-10-CM

## 2024-04-30 DIAGNOSIS — Z6835 Body mass index (BMI) 35.0-35.9, adult: Secondary | ICD-10-CM | POA: Diagnosis not present

## 2024-04-30 DIAGNOSIS — F439 Reaction to severe stress, unspecified: Secondary | ICD-10-CM

## 2024-04-30 NOTE — Progress Notes (Signed)
   SUBJECTIVE:  Chief Complaint: Obesity  Interim History: Patient had a not so great summer- started out good with her daughter's birthday.  Then her husband got a small cough that patient caught and was ill for 25 days.  There was issues with her husbands license was suspended.  One of her fellow providers just diagnosed with a brain tumor.  Robin Knapp is here to discuss her progress with her obesity treatment plan. She is on the practicing portion control and making smarter food choices, such as increasing vegetables and decreasing simple carbohydrates and states she is following her eating plan approximately 75-80 % of the time. She states she is not exercising.   OBJECTIVE: Visit Diagnoses: Problem List Items Addressed This Visit   None   Vitals Temp: 98.2 F (36.8 C) BP: 132/81 Pulse Rate: 83 SpO2: 99 %   Anthropometric Measurements Height: 5' 5 (1.651 m) Weight: 214 lb (97.1 kg) BMI (Calculated): 35.61 Weight at Last Visit: 213 lb Weight Lost Since Last Visit: 0 Weight Gained Since Last Visit: 2 Starting Weight: 199 lb Total Weight Loss (lbs): 0 lb (0 kg)   Body Composition  Body Fat %: 38.4 % Fat Mass (lbs): 82.4 lbs Muscle Mass (lbs): 125.8 lbs Total Body Water (lbs): 80.6 lbs Visceral Fat Rating : 10   Other Clinical Data Today's Visit #: 90 Starting Date: 11/27/17 Comments: PC/[place]     ASSESSMENT AND PLAN: Assessment & Plan Stress at home Patient's stress at home has been significantly higher than previous with mother-in-law health issues, car issues, and finances.  Follow-up on stress level at next appointment. Obesity with starting BMI of 33.12  BMI 35.0-35.9,adult    Diet: [name] is currently in the action stage of change. As such, her goal is to continue with weight loss efforts and has agreed to practicing portion control and making smarter food choices, such as increasing vegetables and decreasing simple carbohydrates.  Ultimate goal over the  next 4 weeks prior to follow-up is to log 3-4 times a week to be able to discuss average calorie intake as well as average protein intake at next appointment.  Exercise:  For substantial health benefits, adults should do at least 150 minutes (2 hours and 30 minutes) a week of moderate-intensity, or 75 minutes (1 hour and 15 minutes) a week of vigorous-intensity aerobic physical activity, or an equivalent combination of moderate- and vigorous-intensity aerobic activity. Aerobic activity should be performed in episodes of at least 10 minutes, and preferably, it should be spread throughout the week.  Behavior Modification:  We discussed the following Behavioral Modification Strategies today: increasing lean protein intake, decreasing simple carbohydrates, increasing vegetables, meal planning and cooking strategies, and planning for success.   Return in about 4 weeks (around 05/28/2024).   She was informed of the importance of frequent follow up visits to maximize her success with intensive lifestyle modifications for her multiple health conditions.  Attestation Statements:   Reviewed by clinician on day of visit: allergies, medications, problem list, medical history, surgical history, family history, social history, and previous encounter notes.   Adelita Cho, MD

## 2024-05-05 ENCOUNTER — Other Ambulatory Visit (HOSPITAL_COMMUNITY): Payer: Self-pay

## 2024-05-25 NOTE — Assessment & Plan Note (Signed)
 Tolerating prescriptions of phentermine  and topiramate  but not experiencing same effects of satiety as she did on GLP1.  She denies any side effects such as palpitations, taste changes, neuropathy or neuralgias.  PDMP checked- no concerns.  Refill at current dose.

## 2024-05-28 ENCOUNTER — Encounter (INDEPENDENT_AMBULATORY_CARE_PROVIDER_SITE_OTHER): Payer: Self-pay | Admitting: Family Medicine

## 2024-05-28 ENCOUNTER — Ambulatory Visit (INDEPENDENT_AMBULATORY_CARE_PROVIDER_SITE_OTHER): Admitting: Family Medicine

## 2024-05-28 ENCOUNTER — Ambulatory Visit: Payer: Commercial Managed Care - PPO | Admitting: Dermatology

## 2024-05-28 ENCOUNTER — Other Ambulatory Visit (HOSPITAL_COMMUNITY): Payer: Self-pay

## 2024-05-28 VITALS — BP 138/88 | Wt 215.0 lb

## 2024-05-28 VITALS — BP 138/88 | HR 83 | Temp 98.1°F | Ht 65.0 in | Wt 215.0 lb

## 2024-05-28 DIAGNOSIS — Z1283 Encounter for screening for malignant neoplasm of skin: Secondary | ICD-10-CM | POA: Diagnosis not present

## 2024-05-28 DIAGNOSIS — Z83719 Family history of colon polyps, unspecified: Secondary | ICD-10-CM | POA: Insufficient documentation

## 2024-05-28 DIAGNOSIS — L814 Other melanin hyperpigmentation: Secondary | ICD-10-CM

## 2024-05-28 DIAGNOSIS — Z1211 Encounter for screening for malignant neoplasm of colon: Secondary | ICD-10-CM | POA: Insufficient documentation

## 2024-05-28 DIAGNOSIS — L578 Other skin changes due to chronic exposure to nonionizing radiation: Secondary | ICD-10-CM

## 2024-05-28 DIAGNOSIS — E669 Obesity, unspecified: Secondary | ICD-10-CM

## 2024-05-28 DIAGNOSIS — E162 Hypoglycemia, unspecified: Secondary | ICD-10-CM | POA: Insufficient documentation

## 2024-05-28 DIAGNOSIS — L910 Hypertrophic scar: Secondary | ICD-10-CM

## 2024-05-28 DIAGNOSIS — D229 Melanocytic nevi, unspecified: Secondary | ICD-10-CM

## 2024-05-28 DIAGNOSIS — L918 Other hypertrophic disorders of the skin: Secondary | ICD-10-CM | POA: Diagnosis not present

## 2024-05-28 DIAGNOSIS — M25519 Pain in unspecified shoulder: Secondary | ICD-10-CM | POA: Insufficient documentation

## 2024-05-28 DIAGNOSIS — G4733 Obstructive sleep apnea (adult) (pediatric): Secondary | ICD-10-CM | POA: Insufficient documentation

## 2024-05-28 DIAGNOSIS — L821 Other seborrheic keratosis: Secondary | ICD-10-CM | POA: Diagnosis not present

## 2024-05-28 DIAGNOSIS — M26609 Unspecified temporomandibular joint disorder, unspecified side: Secondary | ICD-10-CM | POA: Insufficient documentation

## 2024-05-28 DIAGNOSIS — F439 Reaction to severe stress, unspecified: Secondary | ICD-10-CM

## 2024-05-28 DIAGNOSIS — E66811 Obesity, class 1: Secondary | ICD-10-CM

## 2024-05-28 DIAGNOSIS — R030 Elevated blood-pressure reading, without diagnosis of hypertension: Secondary | ICD-10-CM | POA: Insufficient documentation

## 2024-05-28 DIAGNOSIS — D1801 Hemangioma of skin and subcutaneous tissue: Secondary | ICD-10-CM

## 2024-05-28 DIAGNOSIS — Z6835 Body mass index (BMI) 35.0-35.9, adult: Secondary | ICD-10-CM | POA: Diagnosis not present

## 2024-05-28 DIAGNOSIS — W908XXA Exposure to other nonionizing radiation, initial encounter: Secondary | ICD-10-CM

## 2024-05-28 DIAGNOSIS — I493 Ventricular premature depolarization: Secondary | ICD-10-CM | POA: Insufficient documentation

## 2024-05-28 DIAGNOSIS — R9389 Abnormal findings on diagnostic imaging of other specified body structures: Secondary | ICD-10-CM | POA: Insufficient documentation

## 2024-05-28 DIAGNOSIS — R1011 Right upper quadrant pain: Secondary | ICD-10-CM | POA: Insufficient documentation

## 2024-05-28 DIAGNOSIS — R61 Generalized hyperhidrosis: Secondary | ICD-10-CM | POA: Insufficient documentation

## 2024-05-28 DIAGNOSIS — E663 Overweight: Secondary | ICD-10-CM | POA: Insufficient documentation

## 2024-05-28 DIAGNOSIS — Z872 Personal history of diseases of the skin and subcutaneous tissue: Secondary | ICD-10-CM | POA: Insufficient documentation

## 2024-05-28 DIAGNOSIS — R400 Somnolence: Secondary | ICD-10-CM | POA: Insufficient documentation

## 2024-05-28 MED ORDER — TRIAMCINOLONE ACETONIDE 10 MG/ML IJ SUSP
10.0000 mg | Freq: Once | INTRAMUSCULAR | Status: AC
  Administered 2024-05-28: 10 mg

## 2024-05-28 MED ORDER — SERTRALINE HCL 50 MG PO TABS
50.0000 mg | ORAL_TABLET | Freq: Every day | ORAL | 0 refills | Status: DC
  Filled 2024-05-28: qty 90, 90d supply, fill #0

## 2024-05-28 MED ORDER — TRAZODONE HCL 50 MG PO TABS
25.0000 mg | ORAL_TABLET | Freq: Every evening | ORAL | 0 refills | Status: DC | PRN
  Filled 2024-05-28: qty 30, 60d supply, fill #0

## 2024-05-28 NOTE — Patient Instructions (Addendum)
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
## 2024-06-02 ENCOUNTER — Encounter: Payer: Self-pay | Admitting: Neurology

## 2024-06-02 ENCOUNTER — Ambulatory Visit: Admitting: Neurology

## 2024-06-02 VITALS — BP 162/110 | HR 93 | Ht 65.0 in | Wt 220.2 lb

## 2024-06-02 DIAGNOSIS — G5603 Carpal tunnel syndrome, bilateral upper limbs: Secondary | ICD-10-CM | POA: Diagnosis not present

## 2024-06-02 DIAGNOSIS — R202 Paresthesia of skin: Secondary | ICD-10-CM | POA: Diagnosis not present

## 2024-06-02 NOTE — Progress Notes (Signed)
 Chief Complaint  Patient presents with   New Patient (Initial Visit)    RM 14, alone.  Paper referral for [facility] EMG BUE - Forearm numbness. Waking up in middle of night with tingling/pain in fingers up to elbow on right side. Worse since last seen. Tried wrist splint on right hand. She sleeps in this. Left side not as bad. Hard to do fine motor skills.       ASSESSMENT AND PLAN  Robin Knapp is a 53 y.o. female   Worsening bilateral hands paresthesia,  EMG nerve conduction study confirmed moderate right carpal tunnel syndromes, mild on the left side, worsened compared to previous study in 2019, demyelinating in nature there was no evidence of axonal loss.  She did not have motor deficit, I suggest her conservative treatment, wrist splint, as needed NSAIDs,  If her current symptoms continue to worse, may consider hand surgery referral,  DIAGNOSTIC DATA (LABS, IMAGING, TESTING) - I reviewed patient records, labs, notes, testing and imaging myself where available.   MEDICAL HISTORY:  Robin Knapp, is a 53 year old female seen in request by her primary care doctor Espinoza, Alejandra, for evaluation of worsening hands paresthesia,  History is obtained from the patient and review of electronic medical records. I personally reviewed pertinent available imaging films in PACS.   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   PHYSICAL EXAM:   Vitals:   06/02/24 1344  BP: (!) 162/110  Pulse: 93  SpO2: 95%  Weight: 220 lb 3.2 oz (99.9 kg)  Height: 5' 5 (1.651 m)   Body mass index is 36.64 kg/m.  PHYSICAL EXAMNIATION:  Gen: NAD, conversant, well nourised, well groomed                     Cardiovascular: Regular rate rhythm, no peripheral edema, warm, nontender. Eyes: Conjunctivae clear without exudates or hemorrhage Neck: Supple, no carotid bruits. Pulmonary: Clear to auscultation bilaterally   NEUROLOGICAL EXAM:  MENTAL STATUS: Speech/cognition: Awake, alert, oriented to history taking and casual conversation CRANIAL NERVES: CN II: Visual fields are full to confrontation. Pupils are round equal and briskly reactive to light. CN III, IV, VI: extraocular movement are normal. No ptosis. CN V: Facial sensation is intact to light touch CN VII: Face is symmetric with normal eye closure  CN VIII: Hearing is normal to causal conversation. CN IX, X: Phonation is normal. CN XI: Head turning and shoulder shrug are intact  MOTOR: There is no pronator drift of out-stretched arms. Muscle bulk and tone are normal. Muscle strength is normal.  REFLEXES: Reflexes are 2+ and symmetric at the biceps, triceps, knees, and ankles. Plantar responses are flexor.  SENSORY: Decreased pinprick at first 3 finger pads  COORDINATION: There is no trunk or limb dysmetria noted.  GAIT/STANCE: Posture is normal. Gait is steady    REVIEW OF SYSTEMS:  Full 14 system  review of systems performed and notable only for as above All other review of systems were negative.   ALLERGIES: Allergies  Allergen Reactions   Benzoin Dermatitis, Hives and Itching   Sulfa Antibiotics Hives    itching   Sunscreens    Thimerosal (Thiomersal) Hives and Itching    HOME MEDICATIONS: Current Outpatient Medications  Medication Sig Dispense  Refill   acetaminophen  (TYLENOL ) 650 MG CR tablet Take 650 mg by mouth as needed for pain.     albuterol  (VENTOLIN  HFA) 108 (90 Base) MCG/ACT inhaler Inhale 2 puffs into the lungs every 4 (four) hours as needed for wheezing or shortness of breath. 6.7 g 1   atorvastatin  (LIPITOR) 10 MG tablet Take 1 tablet (10 mg total) by mouth daily. 90 tablet 1   budesonide -formoterol  (SYMBICORT ) 160-4.5 MCG/ACT inhaler Inhale 2 puffs into the lungs 2 (two) times daily. 10.2 g 5   Cholecalciferol (VITAMIN D ) 125 MCG (5000 UT) CAPS Take 5,000 Units by mouth daily.     Coenzyme Q10 (CO Q-10 PO) Take by mouth.     fexofenadine  (ALLEGRA ) 180 MG tablet Take 1 tablet (180 mg total) by mouth daily. 90 tablet 3   fluticasone  (FLONASE) 50 MCG/ACT nasal spray Place 2 sprays into both nostrils daily.     ibuprofen (ADVIL,MOTRIN) 600 MG tablet Take 600 mg by mouth as needed.     Magnesium 250 MG TABS Take 1 tablet by mouth daily. (Patient taking differently: Take 1 tablet by mouth as needed.)     naproxen (NAPROSYN) 250 MG tablet Take 500 mg by mouth 2 (two) times daily with a meal.     sertraline  (ZOLOFT ) 50 MG tablet Take 1 tablet (50 mg total) by mouth daily. 90 tablet 0   Specialty Vitamins Products (BIOTIN PLUS KERATIN PO) Take 1 Capful by mouth daily.     traZODone  (DESYREL ) 50 MG tablet Take 1/2 tablets (25 mg total) by mouth at bedtime as needed for sleep. 30 tablet 0   Turmeric 500 MG CAPS Take 1 tablet by mouth daily.     TYRVAYA 0.03 MG/ACT SOLN Place 1 spray into both nostrils 2 (two) times daily.     valACYclovir  (VALTREX ) 500 MG tablet Take 1 tablet (500 mg total) by mouth daily as needed for 14 days. 14 tablet 1   vitamin B-12 (CYANOCOBALAMIN) 100 MCG tablet Take 100 mcg by mouth daily.     Current Facility-Administered Medications  Medication Dose Route Frequency Provider Last Rate Last Admin   chlorpheniramine-HYDROcodone  (TUSSIONEX) 10-8 MG/5ML suspension 5 mL  5 mL Oral Q12H         PAST MEDICAL  HISTORY: Past Medical History:  Diagnosis Date   Allergy     Asthma    Breast lump    Constipation    Dry skin    Fatigue    GERD (gastroesophageal reflux disease)    pregnancy related-zantac   HTN (hypertension)    Hyperlipidemia    Infertility, female    Joint pain    Lactose intolerance    Missed abortion    6weeks 3days   Muscle pain    Obesity    Palpitations    PCOS (polycystic ovarian syndrome)    Seasonal allergies    Skin rash    Stress    Vitamin D  deficiency     PAST SURGICAL HISTORY: Past Surgical History:  Procedure Laterality Date   BREAST BIOPSY     CESAREAN SECTION     DILATION AND EVACUATION  05/09/2012   Procedure: DILATATION AND EVACUATION;  Surgeon: Dickie DELENA Carder, MD;  Location: [facility];  Service: Gynecology;  Laterality: N/A;   EYE SURGERY     PRK (before 2008, around 2007)   LASIK     PRK     WISDOM TOOTH EXTRACTION  1994    FAMILY HISTORY: Family History  Problem Relation Age of Onset   Heart disease Mother    Hyperlipidemia Mother    Hyperlipidemia Father    Hypertension Father    Anxiety disorder Father    Hyperlipidemia Maternal Grandmother    Hypertension Maternal Grandmother    Heart disease Maternal Grandmother    Heart disease Maternal Grandfather    Diabetes Maternal Grandfather    Stroke Paternal Grandmother    Mental illness Paternal Grandmother    Heart disease Paternal Grandmother    Sleep apnea Neg Hx     SOCIAL HISTORY: Social History   Socioeconomic History   Marital status: Married    Spouse name: Not on file   Number of children: 1   Years of education: 12+   Highest education level: Not on file  Occupational History   Occupation: certified nurse midwife  Tobacco Use   Smoking status: Never   Smokeless tobacco: Never  Vaping Use   Vaping status: Never Used  Substance and Sexual Activity   Alcohol use: Yes    Alcohol/week: 1.0 standard drink of alcohol    Types: 1 Glasses of wine per week     Comment: rarely   Drug use: No   Sexual activity: Not on file  Other Topics Concern   Not on file  Social History Narrative   Pt lives with husband   Pt works for [employer]   Right handed    Caffeine use: coffee/soda 1-2 times per week   Social Drivers of Corporate investment banker Strain: Not on file  Food Insecurity: Not on file  Transportation Needs: Not on file  Physical Activity: Not on file  Stress: Not on file  Social Connections: Not on file  Intimate Partner Violence: Not on file      Modena Callander, M.D. Ph.D.  [facility] [address] [place], [place] [address] Ph: [phone] Fax: [phone]  CC:  Chet Mad, DO [address] [place],  [place] [address]  Chet Mad, DO

## 2024-06-04 DIAGNOSIS — R202 Paresthesia of skin: Secondary | ICD-10-CM | POA: Insufficient documentation

## 2024-06-05 ENCOUNTER — Encounter: Payer: Self-pay | Admitting: Neurology

## 2024-06-08 NOTE — Procedures (Signed)
 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  Bilateral median sensory responses showed moderately prolonged peak latency with moderately decreased snap amplitude right worse than left.  Right median motor responses showed moderately prolonged distal latency with well-preserved CMAP amplitude.  Left median motor response showed borderline prolonged distal latency, with normal CMAP amplitude  Electromyography: Selected needle examination of bilateral upper extremity muscles including bilateral abductor pollicis brevis muscle were performed.  There was no spontaneous activity at bilateral APB, normal morphology motor unit potential with slight decreased recruitment patterns.   Conclusion: This is an abnormal study.  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    ------------------------------- Duos Onita. M.D. Ph.D.   [facility] [address] [place], [place] [address] Tel: [phone] Fax: [phone]  Verbal informed consent was obtained from the patient, patient was informed of potential risk of procedure, including bruising, bleeding, hematoma formation, infection, muscle weakness, muscle pain, numbness, among others.        MNC    Nerve / Sites Muscle Latency Ref. Amplitude Ref. Rel Amp Segments Distance Velocity Ref. Area    ms ms mV mV %  cm m/s m/s mVms   R Median - APB     Wrist APB 5.5 <=4.4 8.6 >=4.0 100 Wrist - APB 7   31.1     Upper arm APB 9.5  7.5  87.3 Upper arm - Wrist 21 52 >=49 29.1  L Median - APB     Wrist APB 4.4 <=4.4 5.6 >=4.0 100 Wrist - APB 7   20.1     Upper arm APB 8.7  5.4  96.5 Upper arm - Wrist 22 51 >=49 18.4  R Ulnar - ADM     Wrist ADM 2.0 <=3.3 9.5 >=6.0 100 Wrist - ADM 7   31.9     B.Elbow ADM 4.1  9.6  101 B.Elbow - Wrist 13 63 >=49 30.6     A.Elbow ADM 6.4  8.6  89.7 A.Elbow - B.Elbow 14 62 >=49 28.7  L Ulnar - ADM     Wrist ADM 2.2 <=3.3 7.4 >=6.0 100 Wrist - ADM 7   23.6     B.Elbow ADM 4.5  6.7  90 B.Elbow - Wrist 16 70 >=49 23.5     A.Elbow ADM 6.5  6.5  97.6 A.Elbow - B.Elbow 13 66 >=49 22.2             SNC    Nerve / Sites Rec. Site Peak Lat Ref.  Amp Ref. Segments Distance    ms ms V V  cm  R Median - Orthodromic (Dig II, Mid palm)     Dig II Wrist 4.6 <=3.4 4 >=10 Dig II - Wrist 13  L Median - Orthodromic (Dig II, Mid palm)     Dig II Wrist 4.0 <=3.4 7 >=10 Dig II - Wrist 13  R Ulnar - Orthodromic, (Dig V, Mid palm)  Dig V Wrist 2.6 <=3.1 6 >=5 Dig V - Wrist 11  L Ulnar - Orthodromic, (Dig V, Mid palm)     Dig V Wrist 2.5 <=3.1 12 >=5 Dig V - Wrist 15             F  Wave    Nerve F Lat Ref.   ms ms  R Ulnar - ADM 24.8 <=32.0  L Ulnar - ADM 24.9 <=32.0         EMG Summary Table    Spontaneous MUAP Recruitment  Muscle IA Fib PSW Fasc Other Amp Dur. Poly Pattern  L. Abductor pollicis brevis Normal None None None _______ Normal Normal Normal Reduced  R. Abductor pollicis brevis Normal None None None _______ Normal Normal Normal Reduced  R. Pronator teres Normal None None None _______ Normal Normal Normal Normal  R. Biceps brachii Normal None None None _______ Normal Normal Normal Normal  R. Triceps brachii Normal None None None _______ Normal Normal Normal Normal  R. Deltoid Normal None None None _______ Normal Normal Normal Normal

## 2024-06-11 ENCOUNTER — Encounter: Payer: Self-pay | Admitting: Dermatology

## 2024-06-25 NOTE — Addendum Note (Signed)
 Addended by: ALM DELON SAILOR on: 06/25/2024 12:45 PM   Modules accepted: Level of Service

## 2024-07-13 ENCOUNTER — Other Ambulatory Visit (HOSPITAL_COMMUNITY): Payer: Self-pay

## 2024-07-13 ENCOUNTER — Other Ambulatory Visit: Payer: Self-pay

## 2024-07-13 DIAGNOSIS — Z6836 Body mass index (BMI) 36.0-36.9, adult: Secondary | ICD-10-CM | POA: Diagnosis not present

## 2024-07-13 DIAGNOSIS — G5603 Carpal tunnel syndrome, bilateral upper limbs: Secondary | ICD-10-CM | POA: Diagnosis not present

## 2024-07-13 DIAGNOSIS — E66812 Obesity, class 2: Secondary | ICD-10-CM | POA: Diagnosis not present

## 2024-07-13 DIAGNOSIS — G44209 Tension-type headache, unspecified, not intractable: Secondary | ICD-10-CM | POA: Diagnosis not present

## 2024-07-13 DIAGNOSIS — G4733 Obstructive sleep apnea (adult) (pediatric): Secondary | ICD-10-CM | POA: Diagnosis not present

## 2024-07-13 DIAGNOSIS — Z23 Encounter for immunization: Secondary | ICD-10-CM | POA: Diagnosis not present

## 2024-07-13 DIAGNOSIS — E7800 Pure hypercholesterolemia, unspecified: Secondary | ICD-10-CM | POA: Diagnosis not present

## 2024-07-13 MED ORDER — ATORVASTATIN CALCIUM 20 MG PO TABS
20.0000 mg | ORAL_TABLET | Freq: Every day | ORAL | 1 refills | Status: AC
  Filled 2024-07-13: qty 90, 90d supply, fill #0

## 2024-07-13 MED ORDER — ZEPBOUND 2.5 MG/0.5ML ~~LOC~~ SOAJ
2.5000 mg | SUBCUTANEOUS | 0 refills | Status: DC
  Filled 2024-07-13: qty 2, 28d supply, fill #0

## 2024-07-14 ENCOUNTER — Encounter (INDEPENDENT_AMBULATORY_CARE_PROVIDER_SITE_OTHER): Payer: Self-pay | Admitting: Family Medicine

## 2024-07-14 ENCOUNTER — Other Ambulatory Visit (HOSPITAL_COMMUNITY): Payer: Self-pay

## 2024-07-14 ENCOUNTER — Ambulatory Visit (INDEPENDENT_AMBULATORY_CARE_PROVIDER_SITE_OTHER): Admitting: Family Medicine

## 2024-07-14 VITALS — BP 138/84 | HR 95 | Temp 98.5°F | Ht 65.0 in | Wt 215.0 lb

## 2024-07-14 DIAGNOSIS — E669 Obesity, unspecified: Secondary | ICD-10-CM

## 2024-07-14 DIAGNOSIS — Z6833 Body mass index (BMI) 33.0-33.9, adult: Secondary | ICD-10-CM

## 2024-07-14 DIAGNOSIS — Z6835 Body mass index (BMI) 35.0-35.9, adult: Secondary | ICD-10-CM | POA: Diagnosis not present

## 2024-07-14 DIAGNOSIS — F439 Reaction to severe stress, unspecified: Secondary | ICD-10-CM

## 2024-07-14 MED ORDER — TRAZODONE HCL 50 MG PO TABS
25.0000 mg | ORAL_TABLET | Freq: Every evening | ORAL | 0 refills | Status: AC | PRN
  Filled 2024-07-14: qty 30, 60d supply, fill #0

## 2024-07-14 MED ORDER — SERTRALINE HCL 50 MG PO TABS
50.0000 mg | ORAL_TABLET | Freq: Every day | ORAL | 0 refills | Status: DC
  Filled 2024-07-14 – 2024-08-21 (×2): qty 90, 90d supply, fill #0

## 2024-07-14 NOTE — Progress Notes (Signed)
   SUBJECTIVE:  Chief Complaint: Obesity  Interim History: patient feeling better overall since our last appointment. Patient is eating mindfully and nutritiously but if life starts to get stressful she will emotionally eat.  She is having daily headaches.    Robin Knapp is here to discuss her progress with her obesity treatment plan. She is on the Journaling plan 1200-1400 calories 90 protein Daily and states she is following her eating plan approximately 80 % of the time. She states she is exercising 30 minutes 3 times per week.   OBJECTIVE: Visit Diagnoses: Problem List Items Addressed This Visit   None Visit Diagnoses       Obesity with starting BMI of 33.12    -  Primary     Stress at home         BMI 35.0-35.9,adult           Vitals Temp: 98.5 F (36.9 C) BP: 138/84 Pulse Rate: 95 SpO2: 98 %   Anthropometric Measurements Height: 5' 5 (1.651 m) Weight: 215 lb (97.5 kg) BMI (Calculated): 35.78 Weight at Last Visit: 215lb Weight Lost Since Last Visit: 0lb Weight Gained Since Last Visit: 0lb Starting Weight: 199lb Total Weight Loss (lbs): 0 lb (0 kg)   Body Composition  Body Fat %: 39.4 % Fat Mass (lbs): 84.8 lbs Muscle Mass (lbs): 123.8 lbs Total Body Water (lbs): 79.8 lbs Visceral Fat Rating : 11   Other Clinical Data Fasting: No Labs: no Today's Visit #: 48 Starting Date: 11/27/17     ASSESSMENT AND PLAN: Assessment & Plan Stress at home Patient reports improvement in mood as well as dealing with current stressful situation at home on sertraline  at current dosage.  Slight improvement in sleep with usage of trazodone .  Will refill both sertraline  and trazodone  at current dosage and follow-up at next appointment to see if dose increase is necessary.  No suicidal or homicidal ideation mentioned today. Obesity with starting BMI of 33.12  BMI 35.0-35.9,adult    Diet: Hinda is currently in the action stage of change. As such, her goal is to continue  with weight loss efforts and has agreed to keeping a food journal and adhering to recommended goals of 1200-1400 calories and 85 or more grams protein daily.   Exercise:  Adults should also include muscle-strengthening activities that involve all major muscle groups on 2 or more days a week.  Behavior Modification:  We discussed the following Behavioral Modification Strategies today: increasing lean protein intake, decreasing simple carbohydrates, increasing vegetables, meal planning and cooking strategies, planning for success, and keep a strict food journal.   Return in about 10 weeks (around 09/22/2024).   She was informed of the importance of frequent follow up visits to maximize her success with intensive lifestyle modifications for her multiple health conditions.  Attestation Statements:   Reviewed by clinician on day of visit: allergies, medications, problem list, medical history, surgical history, family history, social history, and previous encounter notes.     Adelita Cho, MD

## 2024-07-20 ENCOUNTER — Encounter: Payer: Self-pay | Admitting: Radiology

## 2024-07-29 ENCOUNTER — Other Ambulatory Visit: Payer: Self-pay

## 2024-07-29 ENCOUNTER — Encounter: Payer: Self-pay | Admitting: Allergy

## 2024-07-29 ENCOUNTER — Ambulatory Visit: Admitting: Allergy

## 2024-07-29 ENCOUNTER — Other Ambulatory Visit (HOSPITAL_COMMUNITY): Payer: Self-pay

## 2024-07-29 VITALS — BP 124/84 | HR 82 | Temp 98.4°F | Resp 16

## 2024-07-29 DIAGNOSIS — J302 Other seasonal allergic rhinitis: Secondary | ICD-10-CM | POA: Diagnosis not present

## 2024-07-29 DIAGNOSIS — J3089 Other allergic rhinitis: Secondary | ICD-10-CM

## 2024-07-29 DIAGNOSIS — J4520 Mild intermittent asthma, uncomplicated: Secondary | ICD-10-CM | POA: Diagnosis not present

## 2024-07-29 DIAGNOSIS — L578 Other skin changes due to chronic exposure to nonionizing radiation: Secondary | ICD-10-CM

## 2024-07-29 MED ORDER — ALBUTEROL SULFATE HFA 108 (90 BASE) MCG/ACT IN AERS
2.0000 | INHALATION_SPRAY | RESPIRATORY_TRACT | 1 refills | Status: DC | PRN
  Filled 2024-07-29: qty 6.7, 25d supply, fill #0

## 2024-07-29 MED ORDER — AIRSUPRA 90-80 MCG/ACT IN AERO
2.0000 | INHALATION_SPRAY | RESPIRATORY_TRACT | 1 refills | Status: AC | PRN
  Filled 2024-07-29: qty 10.7, 30d supply, fill #0

## 2024-07-29 MED ORDER — BUDESONIDE-FORMOTEROL FUMARATE 160-4.5 MCG/ACT IN AERO
2.0000 | INHALATION_SPRAY | Freq: Two times a day (BID) | RESPIRATORY_TRACT | 5 refills | Status: AC
  Filled 2024-07-29: qty 10.2, 30d supply, fill #0

## 2024-07-29 NOTE — Progress Notes (Signed)
 Follow-up Note  RE: Robin Knapp MRN: [id number] DOB: [birth year] Date of Office Visit: 07/29/2024   History of present illness: Robin Knapp is a 53 y.o. female presenting today for follow-up of allergic rhinitis, asthma, contact dermatitis/solar dermatitis.  She was last seen in the office on 12/27/2023 by myself. Discussed the use of AI scribe software for clinical note transcription with the patient, who gave verbal consent to proceed.  Since April, she has experienced two significant respiratory illness/flares. The first occurred in July, lasting from July 3rd to the 28th. During this time, she did not seek emergency care but was prescribed a prednisone  dose pack by a colleague, which did not alleviate her symptoms. She then contacted me via MyChart where I prescribed Symbicort  and Tussionex.  This did help with her overall symptom improvement.  In September, she experienced another respiratory flare which was not quite as severe as the July flare lasting about one and a half to two weeks. During this episode, she continued using Symbicort  and was prescribed another prednisone  dose pack, which resolved her symptoms.  She is currently using Symbicort  at a dose of two puffs twice a day with a spacer. She also uses albuterol  as needed and has expressed a need for refills due to increased usage this year. She used albuterol  more frequently during her July flare, often every four hours, and sometimes struggled to wait that long.  She has been swabbed for respiratory viruses, which returned negative results. She is up to date with her flu and COVID vaccinations.  She has a history of using Singulair in the past but is not currently on it. She also uses Allegra  and Flonase for allergy  management, with no current issues of nasal drainage.  She works in teacher, music as a writer and takes precautions to minimize exposure to respiratory illnesses, such as wearing an N95 mask  when around coughing patients or known sick patients.     Review of systems: 10pt ROS negative unless noted in HPI  Past medical/social/surgical/family history have been reviewed and are unchanged unless specifically indicated below.  No changes  Medication List: Current Outpatient Medications  Medication Sig Dispense Refill   acetaminophen  (TYLENOL ) 650 MG CR tablet Take 650 mg by mouth as needed for pain.     Albuterol -Budesonide  (AIRSUPRA) 90-80 MCG/ACT AERO Inhale 2 puffs into the lungs every 4 (four) hours as needed (Cough, wheeze, shortness of breath or chest tightness). 10.7 g 1   atorvastatin  (LIPITOR) 20 MG tablet Take 1 tablet (20 mg total) by mouth daily. 90 tablet 1   Cholecalciferol (VITAMIN D ) 125 MCG (5000 UT) CAPS Take 5,000 Units by mouth daily.     Coenzyme Q10 (CO Q-10 PO) Take by mouth.     fexofenadine  (ALLEGRA ) 180 MG tablet Take 1 tablet (180 mg total) by mouth daily. 90 tablet 3   fluticasone  (FLONASE) 50 MCG/ACT nasal spray Place 2 sprays into both nostrils daily.     ibuprofen (ADVIL,MOTRIN) 600 MG tablet Take 600 mg by mouth as needed.     Magnesium 250 MG TABS Take 1 tablet by mouth daily. (Patient taking differently: Take 1 tablet by mouth as needed.)     naproxen (NAPROSYN) 250 MG tablet Take 500 mg by mouth 2 (two) times daily with a meal.     sertraline  (ZOLOFT ) 50 MG tablet Take 1 tablet (50 mg total) by mouth daily. 90 tablet 0   Specialty Vitamins Products (BIOTIN PLUS KERATIN PO)  Take 1 Capful by mouth daily.     tirzepatide (ZEPBOUND) 2.5 MG/0.5ML Pen Inject 2.5 mg into the skin once a week. 2 mL 0   traZODone  (DESYREL ) 50 MG tablet Take 1/2 tablet (25 mg total) by mouth at bedtime as needed for sleep. 30 tablet 0   Turmeric 500 MG CAPS Take 1 tablet by mouth daily.     TYRVAYA 0.03 MG/ACT SOLN Place 1 spray into both nostrils 2 (two) times daily.     valACYclovir  (VALTREX ) 500 MG tablet Take 1 tablet (500 mg total) by mouth daily as needed for 14  days. 14 tablet 1   vitamin B-12 (CYANOCOBALAMIN) 100 MCG tablet Take 100 mcg by mouth daily.     budesonide -formoterol  (SYMBICORT ) 160-4.5 MCG/ACT inhaler Inhale 2 puffs into the lungs 2 (two) times daily. 10.2 g 5   Current Facility-Administered Medications  Medication Dose Route Frequency Provider Last Rate Last Admin   chlorpheniramine-HYDROcodone  (TUSSIONEX) 10-8 MG/5ML suspension 5 mL  5 mL Oral Q12H          Known medication allergies: Allergies  Allergen Reactions   Benzoin Dermatitis, Hives and Itching   Sulfa Antibiotics Hives    itching   Sunscreens    Thimerosal (Thiomersal) Hives and Itching     Physical examination: Blood pressure 124/84, pulse 82, temperature 98.4 F (36.9 C), resp. rate 16, SpO2 97%.  General: Alert, interactive, in no acute distress. HEENT: PERRLA, TMs pearly gray, turbinates non-edematous without discharge, post-pharynx non erythematous. Neck: Supple without lymphadenopathy. Lungs: Clear to auscultation without wheezing, rhonchi or rales. {no increased work of breathing. CV: Normal S1, S2 without murmurs. Abdomen: Nondistended, nontender. Skin: Warm and dry, without lesions or rashes. Extremities:  No clubbing, cyanosis or edema. Neuro:   Grossly intact.  Diagnostics/Labs:  Spirometry: FEV1: 2.43L 100%, FVC: 3.07L 102%, ratio consistent with nonobstructive pattern  Assessment and plan:    Allergic rinitis  - continue avoidance measures for grass pollen, weed pollen, tree pollen, mold, dust mite, cat - continue Allegra  180mg  daily as needed and use daily during pollen season - continue Flonase 2 sprays each nostril daily for congestion control With using nasal sprays point tip of bottle toward eye on same side nostril and lean head slightly forward for best technique.   - allergen immunotherapy has been discussed previously including protocol, benefits and risk if medication management is not effective.     Mild persistent asthma -  have access to AIRSUPRA or albuterol  inhaler 2 puffs every 4-6 hours as needed for cough/wheeze/shortness of breath/chest tightness.  May use 15-20 minutes prior to activity.   Monitor frequency of use.  - Continue Symbicort  2 puffs twice a day with spacer device.  Rinse mouth after use - Will consider addition of Singulair if we need to step up therapy  Breathing control goals:  Full participation in all desired activities (may need albuterol  before activity) Albuterol  use two time or less a week on average (not counting use with activity) Cough interfering with sleep two time or less a month Oral steroids no more than once a year No hospitalizations  Contact dermatitis/solar dermatitis - for known sun exposure if needed you can double your Allegra  dose (2 tabs) and also take Pepcid 20-40mg  to help minimize your histamine response  -Continue with use of sunscreen for skin protection  Follow-up 6 months or sooner if needed I appreciate the opportunity to take part in Alitza's care. Please do not hesitate to contact me with questions.  Sincerely,   Danita Brain, MD Allergy /Immunology [facility]

## 2024-07-29 NOTE — Patient Instructions (Addendum)
 Allergic rinitis  - continue avoidance measures for grass pollen, weed pollen, tree pollen, mold, dust mite, cat - continue Allegra  180mg  daily as needed and use daily during pollen season - continue Flonase 2 sprays each nostril daily for congestion control With using nasal sprays point tip of bottle toward eye on same side nostril and lean head slightly forward for best technique.   - allergen immunotherapy has been discussed previously including protocol, benefits and risk if medication management is not effective.     Mild persistent asthma - have access to AIRSUPRA or albuterol  inhaler 2 puffs every 4-6 hours as needed for cough/wheeze/shortness of breath/chest tightness.  May use 15-20 minutes prior to activity.   Monitor frequency of use.  - Continue Symbicort  2 puffs twice a day with spacer device.  Rinse mouth after use - Will consider addition of Singulair if we need to step up therapy  Breathing control goals:  Full participation in all desired activities (may need albuterol  before activity) Albuterol  use two time or less a week on average (not counting use with activity) Cough interfering with sleep two time or less a month Oral steroids no more than once a year No hospitalizations  Contact dermatitis/solar dermatitis - for known sun exposure if needed you can double your Allegra  dose (2 tabs) and also take Pepcid 20-40mg  to help minimize your histamine response  -Continue with use of sunscreen for skin protection  Follow-up 6 months or sooner if needed

## 2024-08-11 NOTE — Addendum Note (Signed)
 Addended by: AZALEA, Aleigh Grunden on: 08/11/2024 05:10 PM   Modules accepted: Orders

## 2024-08-21 ENCOUNTER — Other Ambulatory Visit (HOSPITAL_COMMUNITY): Payer: Self-pay

## 2024-08-24 DIAGNOSIS — E7800 Pure hypercholesterolemia, unspecified: Secondary | ICD-10-CM | POA: Diagnosis not present

## 2024-09-22 ENCOUNTER — Ambulatory Visit (INDEPENDENT_AMBULATORY_CARE_PROVIDER_SITE_OTHER): Payer: Self-pay | Admitting: Family Medicine

## 2024-09-29 ENCOUNTER — Ambulatory Visit (INDEPENDENT_AMBULATORY_CARE_PROVIDER_SITE_OTHER): Admitting: Family Medicine

## 2024-10-06 ENCOUNTER — Encounter (HOSPITAL_COMMUNITY): Payer: Self-pay

## 2024-10-06 ENCOUNTER — Other Ambulatory Visit (HOSPITAL_COMMUNITY): Payer: Self-pay

## 2024-10-06 ENCOUNTER — Ambulatory Visit (INDEPENDENT_AMBULATORY_CARE_PROVIDER_SITE_OTHER): Admitting: Family Medicine

## 2024-10-06 VITALS — BP 138/88 | HR 95 | Temp 98.5°F | Ht 65.0 in | Wt 222.0 lb

## 2024-10-06 DIAGNOSIS — Z6836 Body mass index (BMI) 36.0-36.9, adult: Secondary | ICD-10-CM | POA: Diagnosis not present

## 2024-10-06 DIAGNOSIS — Z6833 Body mass index (BMI) 33.0-33.9, adult: Secondary | ICD-10-CM

## 2024-10-06 DIAGNOSIS — E66811 Obesity, class 1: Secondary | ICD-10-CM

## 2024-10-06 DIAGNOSIS — F439 Reaction to severe stress, unspecified: Secondary | ICD-10-CM

## 2024-10-06 MED ORDER — SERTRALINE HCL 50 MG PO TABS
50.0000 mg | ORAL_TABLET | Freq: Every day | ORAL | 1 refills | Status: AC
  Filled 2024-10-06: qty 90, 90d supply, fill #0

## 2024-10-06 MED ORDER — WEGOVY 1.5 MG PO TABS: 1.5000 mg | ORAL_TABLET | Freq: Every day | ORAL | 0 refills | Status: AC

## 2024-10-06 NOTE — Progress Notes (Signed)
 "  SUBJECTIVE:  Chief Complaint: Obesity  Interim History: Patient here for first appointment since prior to holiday season.  She mentions she enjoyed her season.  She has gotten back on track mentally and is starting to exercise 3 times a weeks and is getting back on plan intake wise.  She is planning on meal prepping and planning today.  She is not logging but she is implementing one change at a time. No plans for the next few weeks- March she has a conference, April she has prom, catillion and then graduation activities start April/ May.   Ross is here to discuss her progress with her obesity treatment plan. She is on the keeping a food journal and adhering to recommended goals of 1200-1400 calories and 85 grams of protein and states she is following her eating plan approximately 80 % of the time. She states she is exercising 45-60 minutes 3 times per week.   OBJECTIVE: Visit Diagnoses: Problem List Items Addressed This Visit   None Visit Diagnoses       Stress at home    -  Primary   Relevant Medications   sertraline  (ZOLOFT ) 50 MG tablet     BMI 36.0-36.9,adult         Class 1 obesity with serious comorbidity and body mass index (BMI) of 33.0 to 33.9 in adult, unspecified obesity type       Relevant Medications   semaglutide -weight management (WEGOVY ) 1.5 MG tablet       Vitals Temp: 98.5 F (36.9 C) BP: 138/88 Pulse Rate: 95 SpO2: 97 %   Anthropometric Measurements Height: 5' 5 (1.651 m) Weight: 222 lb (100.7 kg) BMI (Calculated): 36.94 Weight at Last Visit: 215 lb Weight Lost Since Last Visit: 0 Weight Gained Since Last Visit: 7 Starting Weight: 199 lb Total Weight Loss (lbs): 0 lb (0 kg)   Body Composition  Body Fat %: 40.5 % Fat Mass (lbs): 90.2 lbs Muscle Mass (lbs): 126 lbs Total Body Water (lbs): 83.4 lbs Visceral Fat Rating : 11   Other Clinical Data Today's Visit #: 56 Starting Date: 11/27/17 Comments: 1200-1400/85     ASSESSMENT AND  PLAN: Assessment & Plan Stress at home Patient noticing improvement in her symptoms with usage of sertraline .  No side effects mentioned. Needs refill today and will continue same dose. BMI 36.0-36.9,adult  Class 1 obesity with serious comorbidity and body mass index (BMI) of 33.0 to 33.9 in adult, unspecified obesity type    Diet: [name] is currently in the action stage of change. As such, her goal is to continue with weight loss efforts and has agreed to keeping a food journal and adhering to recommended goals of 1200-1400 calories and 85 or more grams of protein daily.   Exercise:  For additional and more extensive health benefits, adults should increase their aerobic physical activity to 300 minutes (5 hours) a week of moderate-intensity, or 150 minutes a week of vigorous-intensity aerobic physical activity, or an equivalent combination of moderate- and vigorous-intensity activity. Additional health benefits are gained by engaging in physical activity beyond this amount.   Behavior Modification:  We discussed the following Behavioral Modification Strategies today: increasing lean protein intake, decreasing simple carbohydrates, increasing vegetables, meal planning and cooking strategies, keeping healthy foods in the home, planning for success, and keep a strict food journal. We discussed various medication options to help Johniece with her weight loss efforts and we both agreed to start Wegovy  1.5mg  daily through [facility].  Return in  about 6 weeks (around 11/17/2024).   She was informed of the importance of frequent follow up visits to maximize her success with intensive lifestyle modifications for her multiple health conditions.  Attestation Statements:   Reviewed by clinician on day of visit: allergies, medications, problem list, medical history, surgical history, family history, social history, and previous encounter notes.     Adelita Cho, MD "

## 2024-10-09 ENCOUNTER — Other Ambulatory Visit (HOSPITAL_COMMUNITY): Payer: Self-pay

## 2024-11-17 ENCOUNTER — Ambulatory Visit (INDEPENDENT_AMBULATORY_CARE_PROVIDER_SITE_OTHER): Admitting: Family Medicine

## 2025-02-03 ENCOUNTER — Ambulatory Visit: Admitting: Allergy

## 2025-06-01 ENCOUNTER — Ambulatory Visit: Admitting: Dermatology
